# Patient Record
Sex: Male | Born: 1955 | Race: Black or African American | Hispanic: No | Marital: Married | Smoking: Former smoker
Health system: Southern US, Community
[De-identification: ages and names within clinical notes are randomized; demographics above are authoritative.]

## PROBLEM LIST (undated history)

## (undated) DIAGNOSIS — R42 Dizziness and giddiness: Secondary | ICD-10-CM

## (undated) DIAGNOSIS — M1990 Unspecified osteoarthritis, unspecified site: Secondary | ICD-10-CM

## (undated) DIAGNOSIS — R51 Headache: Secondary | ICD-10-CM

## (undated) DIAGNOSIS — I10 Essential (primary) hypertension: Secondary | ICD-10-CM

## (undated) DIAGNOSIS — E785 Hyperlipidemia, unspecified: Secondary | ICD-10-CM

## (undated) HISTORY — PX: COLONOSCOPY: SHX174

## (undated) HISTORY — PX: CORONARY ANGIOPLASTY: SHX604

## (undated) HISTORY — PX: HEMORRHOID SURGERY: SHX153

## (undated) HISTORY — PX: OTHER SURGICAL HISTORY: SHX169

---

## 1999-10-17 ENCOUNTER — Emergency Department (HOSPITAL_COMMUNITY): Admission: EM | Admit: 1999-10-17 | Discharge: 1999-10-17 | Payer: Self-pay | Admitting: Emergency Medicine

## 1999-10-17 ENCOUNTER — Encounter: Payer: Self-pay | Admitting: Emergency Medicine

## 2002-06-30 ENCOUNTER — Emergency Department (HOSPITAL_COMMUNITY): Admission: EM | Admit: 2002-06-30 | Discharge: 2002-07-01 | Payer: Self-pay | Admitting: Emergency Medicine

## 2004-03-09 ENCOUNTER — Ambulatory Visit: Payer: Self-pay | Admitting: *Deleted

## 2004-03-28 ENCOUNTER — Ambulatory Visit: Payer: Self-pay | Admitting: Internal Medicine

## 2004-05-03 ENCOUNTER — Ambulatory Visit: Payer: Self-pay | Admitting: Internal Medicine

## 2004-06-22 ENCOUNTER — Ambulatory Visit: Payer: Self-pay | Admitting: Internal Medicine

## 2004-07-04 ENCOUNTER — Ambulatory Visit: Payer: Self-pay | Admitting: Internal Medicine

## 2004-10-09 ENCOUNTER — Ambulatory Visit: Payer: Self-pay | Admitting: Family Medicine

## 2005-01-09 ENCOUNTER — Ambulatory Visit: Payer: Self-pay | Admitting: Internal Medicine

## 2005-12-01 ENCOUNTER — Emergency Department (HOSPITAL_COMMUNITY): Admission: EM | Admit: 2005-12-01 | Discharge: 2005-12-01 | Payer: Self-pay | Admitting: Emergency Medicine

## 2006-03-10 ENCOUNTER — Ambulatory Visit: Payer: Self-pay | Admitting: Internal Medicine

## 2006-03-28 ENCOUNTER — Ambulatory Visit: Payer: Self-pay | Admitting: Internal Medicine

## 2006-03-28 ENCOUNTER — Inpatient Hospital Stay (HOSPITAL_COMMUNITY): Admission: EM | Admit: 2006-03-28 | Discharge: 2006-03-29 | Payer: Self-pay | Admitting: Emergency Medicine

## 2006-06-03 ENCOUNTER — Ambulatory Visit: Payer: Self-pay | Admitting: Internal Medicine

## 2006-07-14 ENCOUNTER — Ambulatory Visit: Payer: Self-pay | Admitting: Internal Medicine

## 2006-08-07 ENCOUNTER — Ambulatory Visit: Payer: Self-pay | Admitting: Internal Medicine

## 2006-10-12 ENCOUNTER — Emergency Department (HOSPITAL_COMMUNITY): Admission: EM | Admit: 2006-10-12 | Discharge: 2006-10-12 | Payer: Self-pay | Admitting: Emergency Medicine

## 2007-02-09 ENCOUNTER — Ambulatory Visit: Payer: Self-pay | Admitting: Internal Medicine

## 2007-02-10 ENCOUNTER — Encounter (INDEPENDENT_AMBULATORY_CARE_PROVIDER_SITE_OTHER): Payer: Self-pay | Admitting: Internal Medicine

## 2007-03-11 ENCOUNTER — Encounter (INDEPENDENT_AMBULATORY_CARE_PROVIDER_SITE_OTHER): Payer: Self-pay | Admitting: *Deleted

## 2007-05-01 ENCOUNTER — Ambulatory Visit: Payer: Self-pay | Admitting: Internal Medicine

## 2007-09-25 ENCOUNTER — Ambulatory Visit: Payer: Self-pay | Admitting: Internal Medicine

## 2007-11-23 ENCOUNTER — Ambulatory Visit: Payer: Self-pay | Admitting: Internal Medicine

## 2007-11-23 LAB — CONVERTED CEMR LAB
Albumin: 4.5 g/dL (ref 3.5–5.2)
Alkaline Phosphatase: 43 units/L (ref 39–117)
BUN: 17 mg/dL (ref 6–23)
Calcium: 9.1 mg/dL (ref 8.4–10.5)
Chloride: 106 meq/L (ref 96–112)
Glucose, Bld: 95 mg/dL (ref 70–99)
LDL Cholesterol: 73 mg/dL (ref 0–99)
Potassium: 4.1 meq/L (ref 3.5–5.3)
Sodium: 141 meq/L (ref 135–145)
Total Protein: 7 g/dL (ref 6.0–8.3)
Triglycerides: 49 mg/dL (ref <150)

## 2007-12-03 ENCOUNTER — Emergency Department (HOSPITAL_COMMUNITY): Admission: EM | Admit: 2007-12-03 | Discharge: 2007-12-03 | Payer: Self-pay | Admitting: Emergency Medicine

## 2007-12-31 ENCOUNTER — Ambulatory Visit: Payer: Self-pay | Admitting: Internal Medicine

## 2008-04-15 ENCOUNTER — Ambulatory Visit: Payer: Self-pay | Admitting: Internal Medicine

## 2008-07-19 ENCOUNTER — Ambulatory Visit: Payer: Self-pay | Admitting: Internal Medicine

## 2008-11-08 ENCOUNTER — Ambulatory Visit: Payer: Self-pay | Admitting: Internal Medicine

## 2009-05-04 ENCOUNTER — Ambulatory Visit: Payer: Self-pay | Admitting: Family Medicine

## 2009-05-04 ENCOUNTER — Encounter (INDEPENDENT_AMBULATORY_CARE_PROVIDER_SITE_OTHER): Payer: Self-pay | Admitting: Internal Medicine

## 2009-05-04 LAB — CONVERTED CEMR LAB: Microalb, Ur: 1.74 mg/dL (ref 0.00–1.89)

## 2009-06-09 ENCOUNTER — Ambulatory Visit (HOSPITAL_COMMUNITY): Admission: RE | Admit: 2009-06-09 | Discharge: 2009-06-09 | Payer: Self-pay | Admitting: Internal Medicine

## 2009-06-12 ENCOUNTER — Ambulatory Visit: Payer: Self-pay | Admitting: Internal Medicine

## 2009-06-27 ENCOUNTER — Encounter: Admission: RE | Admit: 2009-06-27 | Discharge: 2009-08-02 | Payer: Self-pay | Admitting: Internal Medicine

## 2009-07-07 ENCOUNTER — Ambulatory Visit: Payer: Self-pay | Admitting: Family Medicine

## 2009-07-09 ENCOUNTER — Emergency Department (HOSPITAL_COMMUNITY): Admission: EM | Admit: 2009-07-09 | Discharge: 2009-07-10 | Payer: Self-pay | Admitting: Emergency Medicine

## 2009-09-13 ENCOUNTER — Ambulatory Visit: Payer: Self-pay | Admitting: Internal Medicine

## 2009-09-13 LAB — CONVERTED CEMR LAB
ALT: 11 units/L (ref 0–53)
AST: 19 units/L (ref 0–37)
Albumin: 4.6 g/dL (ref 3.5–5.2)
BUN: 12 mg/dL (ref 6–23)
CO2: 24 meq/L (ref 19–32)
Calcium: 9.5 mg/dL (ref 8.4–10.5)
Chloride: 103 meq/L (ref 96–112)
Cholesterol: 142 mg/dL (ref 0–200)
PSA: 0.97 ng/mL (ref 0.10–4.00)
Potassium: 4.1 meq/L (ref 3.5–5.3)

## 2009-12-13 ENCOUNTER — Ambulatory Visit: Payer: Self-pay | Admitting: Internal Medicine

## 2010-03-16 ENCOUNTER — Encounter (INDEPENDENT_AMBULATORY_CARE_PROVIDER_SITE_OTHER): Payer: Self-pay | Admitting: *Deleted

## 2010-03-16 LAB — CONVERTED CEMR LAB
BUN: 13 mg/dL (ref 6–23)
CO2: 24 meq/L (ref 19–32)
Chlamydia, Swab/Urine, PCR: NEGATIVE
Chloride: 103 meq/L (ref 96–112)
Glucose, Bld: 120 mg/dL — ABNORMAL HIGH (ref 70–99)
Hemoglobin: 12.4 g/dL — ABNORMAL LOW (ref 13.0–17.0)
LDL Cholesterol: 90 mg/dL (ref 0–99)
Lymphocytes Relative: 42 % (ref 12–46)
Lymphs Abs: 1.9 10*3/uL (ref 0.7–4.0)
Microalb, Ur: 0.52 mg/dL (ref 0.00–1.89)
Monocytes Relative: 7 % (ref 3–12)
Neutro Abs: 2.3 10*3/uL (ref 1.7–7.7)
Neutrophils Relative %: 49 % (ref 43–77)
Potassium: 4.1 meq/L (ref 3.5–5.3)
RBC: 4.43 M/uL (ref 4.22–5.81)
Triglycerides: 114 mg/dL (ref <150)
VLDL: 23 mg/dL (ref 0–40)
WBC: 4.6 10*3/uL (ref 4.0–10.5)

## 2010-05-20 ENCOUNTER — Emergency Department (HOSPITAL_COMMUNITY)
Admission: EM | Admit: 2010-05-20 | Discharge: 2010-05-20 | Payer: Self-pay | Source: Home / Self Care | Admitting: Emergency Medicine

## 2010-05-25 ENCOUNTER — Encounter (INDEPENDENT_AMBULATORY_CARE_PROVIDER_SITE_OTHER): Payer: Self-pay | Admitting: *Deleted

## 2010-05-25 LAB — CONVERTED CEMR LAB
AST: 18 units/L (ref 0–37)
Albumin: 4.4 g/dL (ref 3.5–5.2)
Alkaline Phosphatase: 42 units/L (ref 39–117)
BUN: 14 mg/dL (ref 6–23)
HDL: 54 mg/dL (ref >39)
Hgb A1c MFr Bld: 7.4 % — ABNORMAL HIGH (ref <5.7)
LDL Cholesterol: 72 mg/dL (ref 0–99)
Potassium: 4.2 meq/L (ref 3.5–5.3)
Total CHOL/HDL Ratio: 2.6

## 2010-08-02 ENCOUNTER — Inpatient Hospital Stay (INDEPENDENT_AMBULATORY_CARE_PROVIDER_SITE_OTHER)
Admission: RE | Admit: 2010-08-02 | Discharge: 2010-08-02 | Disposition: A | Discharge status: Home / Self Care | Payer: Self-pay | Source: Ambulatory Visit | Attending: Emergency Medicine | Admitting: Emergency Medicine

## 2010-08-02 DIAGNOSIS — M76899 Other specified enthesopathies of unspecified lower limb, excluding foot: Secondary | ICD-10-CM

## 2010-08-02 DIAGNOSIS — M25469 Effusion, unspecified knee: Secondary | ICD-10-CM

## 2010-08-14 ENCOUNTER — Other Ambulatory Visit (HOSPITAL_COMMUNITY): Payer: Self-pay | Admitting: Family Medicine

## 2010-08-14 DIAGNOSIS — M25561 Pain in right knee: Secondary | ICD-10-CM

## 2010-08-21 ENCOUNTER — Ambulatory Visit (HOSPITAL_COMMUNITY)
Admission: RE | Admit: 2010-08-21 | Discharge: 2010-08-21 | Disposition: A | Discharge status: Home / Self Care | Payer: Self-pay | Source: Ambulatory Visit | Attending: Family Medicine | Admitting: Family Medicine

## 2010-08-21 DIAGNOSIS — M1710 Unilateral primary osteoarthritis, unspecified knee: Secondary | ICD-10-CM | POA: Insufficient documentation

## 2010-08-21 DIAGNOSIS — M25561 Pain in right knee: Secondary | ICD-10-CM

## 2010-08-21 DIAGNOSIS — M25669 Stiffness of unspecified knee, not elsewhere classified: Secondary | ICD-10-CM | POA: Insufficient documentation

## 2010-08-21 DIAGNOSIS — IMO0002 Reserved for concepts with insufficient information to code with codable children: Secondary | ICD-10-CM | POA: Insufficient documentation

## 2010-08-21 DIAGNOSIS — M25569 Pain in unspecified knee: Secondary | ICD-10-CM | POA: Insufficient documentation

## 2010-08-21 DIAGNOSIS — M23329 Other meniscus derangements, posterior horn of medial meniscus, unspecified knee: Secondary | ICD-10-CM | POA: Insufficient documentation

## 2010-08-21 DIAGNOSIS — M25469 Effusion, unspecified knee: Secondary | ICD-10-CM | POA: Insufficient documentation

## 2010-09-04 LAB — BODY FLUID CULTURE

## 2010-09-04 LAB — BODY FLUID CELL COUNT WITH DIFFERENTIAL: Monocyte-Macrophage-Serous Fluid: 82 % (ref 50–90)

## 2010-11-09 NOTE — Discharge Summary (Signed)
Brendan Gonzales, Brendan Gonzales                ACCOUNT NO.:  [id number]   MEDICAL RECORD NO.:  [id number]          PATIENT TYPE:  INP   LOCATION:  3714                         FACILITY:  [facility]   PHYSICIAN:  Jackie Plum, M.D.DATE OF BIRTH:  08-01-1955   DATE OF ADMISSION:  03/28/2006  DATE OF DISCHARGE:  03/29/2006                                 DISCHARGE SUMMARY   DISCHARGE DIAGNOSIS:  1. Chest pain (cardiac catheterization done by Dr. Elsie Lincoln did not reveal      any obstructive coronary artery disease).  2. History of diabetes.   DISCHARGE MEDICATIONS:  The patient is going to resume his preadmission  medications.  He has been advised to hold his metformin until Monday,  March 31, 2006.  He is going to be on metformin 500 mg daily, Zocor 200 mg  daily, aspirin 81 mg daily, Tricor , Toprol-XL 25 mg daily.   He is to follow up with his PCP at [facility].   CONSULTATIONS:  Dr. Elsie Lincoln.   PROCEDURE:  Cardiac catheterization as noted above.   CONDITION ON DISCHARGE:  Improved, satisfactory.   REASON FOR ADMISSION:  Chest pain.  Patient presented with chest pain.  Pain  was relieved by nitroglycerin drip.  Chest x-ray did not reveal any acute  abnormality and his cardiac enzymes were negative.  Patient was seen by  cardiology as he has risk factors of obesity, hypertension, dyslipidemia.  Cardiac catheterization was done and came back negative.  Patient was  __________  hematoma in his groin.  He is chest pain free.  His  cardiopulmonary clear to auscultation and unremarkable.  Abdomen is soft  nontender.  Bowel sounds are present.  He is going to be discharged home on  the above medications.   LABORATORY DATA:  WBC count 6.4, hemoglobin 12.3, hematocrit 35.9, platelets  248,000.  Sodium 137, potassium 3.8, chloride 102, CO2 27, glucose 102, BUN  11, creatinine 0.7, calcium 8.9.   Patient discharged in stable and satisfactory condition.      Jackie Plum, M.D.  Electronically Signed     GO/MEDQ  D:  03/29/2006  T:  03/30/2006  Job:  [id number]   cc:   Madaline Savage, M.D.

## 2010-11-09 NOTE — Cardiovascular Report (Signed)
Brendan Gonzales, Brendan Gonzales                ACCOUNT NO.:  [id number]   MEDICAL RECORD NO.:  [id number]          PATIENT TYPE:  INP   LOCATION:  3714                         FACILITY:  [facility]   PHYSICIAN:  Madaline Savage, M.D.DATE OF BIRTH:  [birth year]   DATE OF PROCEDURE:  03/28/2006  DATE OF DISCHARGE:  03/29/2006                            CARDIAC CATHETERIZATION   PROCEDURES PERFORMED:  Cardiac catheterization by Judkins technique  including combined left heart catheterization with left ventricular  angiography and retrograde left heart catheterization.   COMPLICATIONS:  None.   ENTRY SITE:  Right femoral.   DYE USED:  Omnipaque.   PATIENT PROFILE:  The patient is a 55 year old hypertensive diabetic  gentleman who presented to the emergency room with chest pain that was  reported as being sometimes exertional, related and relieved with rest  and at other times coming with rest.  He had never taken nitroglycerin  and had never used antacid for it.  The pain has been occurring for the  last 2-3 months and worse presently causing his presentation to the  emergency room.  His EKG had shown normal sinus rhythm with first degree  AV block and ST-segment changes suggestive of early repolarization  versus ischemia.  Neither parent had known coronary disease.   MEDICATIONS:  1. Metformin  2. Lopid.  3. Lisinopril.   Because of his presentation and his risk factors, it was then decided to  proceed with diagnostic cardiac catheterization which was accomplished  without complication.   RESULTS:  The patient's coronary arteries were angiographically patent.  They consisted of a LAD coursing to the cardiac apex giving rise to one  septal branch and one diagonal branch, all of which was normal.  The  circumflex consisted of two obtuse marginal branches and then the  circumflex terminated as a posterolateral branch.  No lesions were seen  in the circumflex coronary artery.   The right  coronary artery was large and dominant giving rise to a  bifurcating posterolateral branch and posterior descending branch.  LVEF  was normal.   FINAL IMPRESSION:  1. Chest pain with cardiac risk factors.  2. Angiographically patent coronary arteries.  3. Normal left ventricular systolic function.   PLAN:  Reassurance.           ______________________________  Madaline Savage, M.D.     WHG/MEDQ  D:  07/02/2006  T:  07/02/2006  Job:  [id number]

## 2010-11-09 NOTE — H&P (Signed)
Brendan Gonzales, Brendan Gonzales                ACCOUNT NO.:  [id number]   MEDICAL RECORD NO.:  [id number]          PATIENT TYPE:  INP   LOCATION:  1827                         FACILITY:  [facility]   PHYSICIAN:  Jackie Plum, M.D.DATE OF BIRTH:  [birth year]   DATE OF ADMISSION:  03/28/2006  DATE OF DISCHARGE:                                HISTORY & PHYSICAL   CHIEF COMPLAINT:  Chest pain.   HISTORY OF PRESENT ILLNESS:  The patient has 2 days of chest pain.  Had  chest pain yesterday while at rest and had a recurrence of this pain today  as well.  Pain was described as sternal, sharp pain, which went up to his  neck.  There is some mild shortness of breath associated with the episode  without any dizziness, syncope, pre-syncope, palpitations, PND, orthopnea.  He has not had any history of fever, chills, vomiting, diarrhea, abdominal  pain. In the emergency room, the patient was put on nitroglycerin drip with  improvement in his pain. Lovenox was ordered .  At the emergency room, the  patient had 12-lead EKG done, which showed sinus rhythm  with first-degree  AV block.  Lab work was obtained.  Started on nitroglycerin drip with  improvement in his pain and the patient was admitted to hospitalist service.   PAST MEDICAL HISTORY:  1. History of hypertension.  2. Diabetes.  3. Dyslipidemia.   MEDICATIONS:  1. Lopid 600 mg b.i.d..  2. Metformin 500 mg daily.  3. Tramadol 50 mg t.i.d.  4. Lisinopril 10 mg daily.  5. HCTZ 25 mg daily.   ALLERGIES:  The patient does not have any medication allergies.   FAMILY HISTORY:  Negative for any history of heart disease or stoke.   SOCIAL HISTORY:  The patient does smoke cigarettes.  Does not drink alcohol.   REVIEW OF SYSTEMS:  As noted above and otherwise unremarkable. Marland Kitchen   PHYSICAL EXAMINATION:  VITAL SIGNS:  Blood pressure 151/94.  Pulse 69.  Respirations 16.  Temperature 98.3 degrees Fahrenheit.  O2 saturation of 99%  on oxygen by nasal  cannula.  GENERAL:  The patient was in acute pulmonary distress.  He received a  nitroglycerin drip.  HEENT:  Pupils equal, round and reactive to light.  NECK:  Supple.  No JVD.  LUNGS:  Clear to auscultation.  CARDIOVASCULAR:  Regular. No gallops.  ABDOMEN:  Obese, soft, bowel sounds present.  EXTREMITIES:  No cyanosis, no edema.   LABS:  EKG as above.  X-ray of the chest did not reveal any cardiomegaly.  acute cardiopulmonary infiltrates.  Changes seen.  Sodium 109, potassium  4.3, chloride 103, BUN 9, glucose 103.  Hemoglobin 13.9, hematocrit 41, WBC  count 4.5, platelet count 247. D-dimer was less than  0.22 mcg/mL.   IMPRESSION:  Chest pain, relieved by nitroglycerin.  The patient admitted to  telemetry bed. We continued  nitroglycerin drip and Lovenox.  Will hold  Metformin for now just in case he needs cardiac catheterization.  I have  asked Dr. Jacinto Halim of cardiology to evaluate patient in consultation.  Will  cycle  his exams, and further recommendations will be made as the database  expands.      Jackie Plum, M.D.  Electronically Signed     GO/MEDQ  D:  03/28/2006  T:  03/28/2006  Job:  [id number]   cc:   Cristy Hilts. Jacinto Halim, MD

## 2011-04-22 ENCOUNTER — Ambulatory Visit: Payer: Self-pay | Admitting: Physical Therapy

## 2011-04-22 DIAGNOSIS — IMO0001 Reserved for inherently not codable concepts without codable children: Secondary | ICD-10-CM | POA: Insufficient documentation

## 2011-04-22 DIAGNOSIS — M25569 Pain in unspecified knee: Secondary | ICD-10-CM | POA: Insufficient documentation

## 2011-04-22 DIAGNOSIS — M25669 Stiffness of unspecified knee, not elsewhere classified: Secondary | ICD-10-CM | POA: Insufficient documentation

## 2011-04-24 ENCOUNTER — Ambulatory Visit: Payer: Self-pay | Admitting: Physical Therapy

## 2011-04-30 ENCOUNTER — Ambulatory Visit: Payer: Self-pay | Attending: Student | Admitting: Physical Therapy

## 2011-04-30 DIAGNOSIS — M25669 Stiffness of unspecified knee, not elsewhere classified: Secondary | ICD-10-CM | POA: Insufficient documentation

## 2011-04-30 DIAGNOSIS — IMO0001 Reserved for inherently not codable concepts without codable children: Secondary | ICD-10-CM | POA: Insufficient documentation

## 2011-04-30 DIAGNOSIS — M25569 Pain in unspecified knee: Secondary | ICD-10-CM | POA: Insufficient documentation

## 2011-05-01 ENCOUNTER — Ambulatory Visit: Payer: Self-pay | Admitting: Physical Therapy

## 2011-05-07 ENCOUNTER — Ambulatory Visit: Payer: Self-pay | Admitting: Physical Therapy

## 2011-05-09 ENCOUNTER — Ambulatory Visit: Payer: Self-pay | Admitting: Physical Therapy

## 2011-05-14 ENCOUNTER — Ambulatory Visit: Payer: Self-pay | Admitting: Physical Therapy

## 2011-05-21 ENCOUNTER — Ambulatory Visit: Payer: Self-pay | Admitting: Physical Therapy

## 2011-05-23 ENCOUNTER — Ambulatory Visit: Payer: Self-pay | Admitting: Physical Therapy

## 2011-06-14 DIAGNOSIS — M1710 Unilateral primary osteoarthritis, unspecified knee: Secondary | ICD-10-CM | POA: Insufficient documentation

## 2011-06-26 ENCOUNTER — Encounter (HOSPITAL_COMMUNITY): Payer: Self-pay | Admitting: *Deleted

## 2011-06-27 ENCOUNTER — Ambulatory Visit (HOSPITAL_COMMUNITY): Payer: Self-pay | Admitting: Anesthesiology

## 2011-06-27 ENCOUNTER — Encounter (HOSPITAL_COMMUNITY): Payer: Self-pay | Admitting: Anesthesiology

## 2011-06-27 ENCOUNTER — Encounter (HOSPITAL_COMMUNITY)
Admission: RE | Disposition: A | Discharge status: Home / Self Care | Payer: Self-pay | Source: Ambulatory Visit | Attending: Gastroenterology

## 2011-06-27 ENCOUNTER — Encounter (HOSPITAL_COMMUNITY): Payer: Self-pay | Admitting: Gastroenterology

## 2011-06-27 ENCOUNTER — Other Ambulatory Visit: Payer: Self-pay | Admitting: Gastroenterology

## 2011-06-27 ENCOUNTER — Encounter (HOSPITAL_COMMUNITY): Payer: Self-pay | Admitting: *Deleted

## 2011-06-27 ENCOUNTER — Ambulatory Visit (HOSPITAL_COMMUNITY)
Admission: RE | Admit: 2011-06-27 | Discharge: 2011-06-27 | Disposition: A | Discharge status: Home / Self Care | Payer: Self-pay | Source: Ambulatory Visit | Attending: Gastroenterology | Admitting: Gastroenterology

## 2011-06-27 DIAGNOSIS — Z1211 Encounter for screening for malignant neoplasm of colon: Secondary | ICD-10-CM

## 2011-06-27 DIAGNOSIS — D126 Benign neoplasm of colon, unspecified: Secondary | ICD-10-CM | POA: Insufficient documentation

## 2011-06-27 HISTORY — DX: Headache: R51

## 2011-06-27 HISTORY — DX: Essential (primary) hypertension: I10

## 2011-06-27 HISTORY — DX: Dizziness and giddiness: R42

## 2011-06-27 HISTORY — DX: Unspecified osteoarthritis, unspecified site: M19.90

## 2011-06-27 HISTORY — DX: Hyperlipidemia, unspecified: E78.5

## 2011-06-27 LAB — GLUCOSE, CAPILLARY: Glucose-Capillary: 114 mg/dL — ABNORMAL HIGH (ref 70–99)

## 2011-06-27 SURGERY — COLONOSCOPY WITH PROPOFOL
Anesthesia: Monitor Anesthesia Care

## 2011-06-27 MED ORDER — MIDAZOLAM HCL 5 MG/5ML IJ SOLN
INTRAMUSCULAR | Status: DC | PRN
  Administered 2011-06-27: 2 mg via INTRAVENOUS

## 2011-06-27 MED ORDER — SODIUM CHLORIDE 0.9 % IV SOLN
Freq: Once | INTRAVENOUS | Status: AC
  Administered 2011-06-27: 1000 mL via INTRAVENOUS

## 2011-06-27 MED ORDER — FENTANYL CITRATE 0.05 MG/ML IJ SOLN
INTRAMUSCULAR | Status: DC | PRN
  Administered 2011-06-27 (×2): 50 ug via INTRAVENOUS

## 2011-06-27 MED ORDER — PROPOFOL 10 MG/ML IV EMUL
INTRAVENOUS | Status: DC | PRN
  Administered 2011-06-27: 140 ug/kg/min via INTRAVENOUS

## 2011-06-27 NOTE — Anesthesia Postprocedure Evaluation (Signed)
  Anesthesia Post-op Note  Patient: Brendan Gonzales  Procedure(s) Performed:  COLONOSCOPY WITH PROPOFOL  Patient Location: PACU  Anesthesia Type: MAC  Level of Consciousness: awake and alert   Airway and Oxygen Therapy: Patient Spontanous Breathing  Post-op Pain: mild  Post-op Assessment: Post-op Vital signs reviewed, Patient's Cardiovascular Status Stable, Respiratory Function Stable, Patent Airway and No signs of Nausea or vomiting  Post-op Vital Signs: stable  Complications: No apparent anesthesia complications

## 2011-06-27 NOTE — Transfer of Care (Signed)
Immediate Anesthesia Transfer of Care Note  Patient: Brendan Gonzales  Procedure(s) Performed:  COLONOSCOPY WITH PROPOFOL  Patient Location: PACU  Anesthesia Type: MAC  Level of Consciousness: sedated  Airway & Oxygen Therapy: Patient Spontanous Breathing and Patient connected to face mask oxygen  Post-op Assessment: Report given to PACU RN and Post -op Vital signs reviewed and stable  Post vital signs: Reviewed and stable  Complications: No apparent anesthesia complications

## 2011-06-27 NOTE — Interval H&P Note (Signed)
History and Physical Interval Note:  06/27/2011 2:35 PM  Brendan Gonzales  has presented today for surgery, with the diagnosis of screening  The various methods of treatment have been discussed with the patient and family. After consideration of risks, benefits and other options for treatment, the patient has consented to  Procedure(s): COLONOSCOPY WITH PROPOFOL as a surgical intervention .  The patients' history has been reviewed, patient examined, no change in status, stable for surgery.  I have reviewed the patients' chart and labs.  Questions were answered to the patient's satisfaction.     Gerry Blanchfield C.

## 2011-06-27 NOTE — Brief Op Note (Signed)
See endopro note 

## 2011-06-27 NOTE — H&P (Signed)
Date of Initial H&P: 06/10/11  History reviewed, patient examined, no change in status, stable for surgery.

## 2011-06-27 NOTE — Op Note (Signed)
[facility] [address] [place], [place]  [address]  COLONOSCOPY PROCEDURE REPORT  PATIENT:  Brendan Gonzales, Brendan Gonzales  MR#:  [id number] BIRTHDATE:  17-Dec-1955, 55 yrs. old  GENDER:  male ENDOSCOPIST:  Charlott Rakes, MD REF. BY:  Yisroel Ramming, M.D. PROCEDURE DATE:  06/27/2011 PROCEDURE:  Colonoscopy with snare polypectomy ASA CLASS:  Class III INDICATIONS:  Screening MEDICATIONS:   See Anesthesia Report.  DESCRIPTION OF PROCEDURE:   After the risks benefits and alternatives of the procedure were thoroughly explained, informed consent was obtained.  The EC-[id number] ([id number]) endoscope was introduced through the anus and advanced to the cecum, which was identified by both the appendix and ileocecal valve, without limitations.  The quality of the prep was good.  The instrument was then slowly withdrawn as the colon was fully examined. <<PROCEDUREIMAGES>>  FINDINGS:  Rectal exam unremarkable.  Pediatric colonoscope inserted into the colon and advanced to the cecum, where the appendiceal orifice and ileocecal valve were identified.    The terminal ileum was intubated and was normal in appearance. On careful withdrawal of the colonoscope a 5 mm sessile polyp was seen in the rectum that was removed with snare cautery. Retroflexion revealed small internal hemorrhoids.  COMPLICATIONS:  None  IMPRESSION:   1. Small colon polyp status post snare cautery 2. Small internal hemorrhoids  RECOMMENDATIONS:   1. Follow up on path 2. Hold aspirin products for 7 days  ______________________________ Charlott Rakes, MD  CC:  n. eSIGNEDCharlott Rakes at 06/27/2011 03:23 PM  Laurence Slate, [phone]

## 2011-06-27 NOTE — Anesthesia Preprocedure Evaluation (Signed)
Anesthesia Evaluation  Patient identified by MRN, date of birth, ID band Patient awake    Reviewed: Allergy & Precautions, H&P , NPO status , Patient's Chart, lab work & pertinent test results  Airway Mallampati: I TM Distance: >3 FB Neck ROM: Full    Dental No notable dental hx.    Pulmonary neg pulmonary ROS,  clear to auscultation  Pulmonary exam normal       Cardiovascular hypertension, Pt. on medications neg cardio ROS Regular Normal    Neuro/Psych  Headaches, Negative Neurological ROS  Negative Psych ROS   GI/Hepatic negative GI ROS, Neg liver ROS,   Endo/Other  Negative Endocrine ROSDiabetes mellitus-, Oral Hypoglycemic Agents  Renal/GU negative Renal ROS  Genitourinary negative   Musculoskeletal negative musculoskeletal ROS (+)   Abdominal   Peds negative pediatric ROS (+)  Hematology negative hematology ROS (+)   Anesthesia Other Findings   Reproductive/Obstetrics negative OB ROS                           Anesthesia Physical Anesthesia Plan  ASA: II  Anesthesia Plan: MAC   Post-op Pain Management:    Induction: Intravenous  Airway Management Planned:   Additional Equipment:   Intra-op Plan:   Post-operative Plan:   Informed Consent: I have reviewed the patients History and Physical, chart, labs and discussed the procedure including the risks, benefits and alternatives for the proposed anesthesia with the patient or authorized representative who has indicated his/her understanding and acceptance.   Dental advisory given  Plan Discussed with: CRNA  Anesthesia Plan Comments:         Anesthesia Quick Evaluation

## 2012-02-17 ENCOUNTER — Encounter (HOSPITAL_COMMUNITY): Payer: Self-pay

## 2012-02-17 ENCOUNTER — Emergency Department (INDEPENDENT_AMBULATORY_CARE_PROVIDER_SITE_OTHER)
Admission: EM | Admit: 2012-02-17 | Discharge: 2012-02-17 | Disposition: A | Discharge status: Home / Self Care | Payer: Self-pay | Source: Home / Self Care | Attending: Emergency Medicine | Admitting: Emergency Medicine

## 2012-02-17 DIAGNOSIS — R197 Diarrhea, unspecified: Secondary | ICD-10-CM

## 2012-02-17 LAB — POCT I-STAT, CHEM 8
BUN: 9 mg/dL (ref 6–23)
Chloride: 103 mEq/L (ref 96–112)
Creatinine, Ser: 0.8 mg/dL (ref 0.50–1.35)
Sodium: 142 mEq/L (ref 135–145)
TCO2: 26 mmol/L (ref 0–100)

## 2012-02-17 MED ORDER — ONDANSETRON 4 MG PO TBDP
4.0000 mg | ORAL_TABLET | Freq: Once | ORAL | Status: AC
  Administered 2012-02-17: 4 mg via ORAL

## 2012-02-17 MED ORDER — ONDANSETRON 4 MG PO TBDP
ORAL_TABLET | ORAL | Status: AC
  Filled 2012-02-17: qty 1

## 2012-02-17 MED ORDER — LOPERAMIDE HCL 2 MG PO CAPS: 2.0000 mg | ORAL_CAPSULE | Freq: Four times a day (QID) | ORAL | Status: AC | PRN

## 2012-02-17 MED ORDER — PROMETHAZINE HCL 25 MG PO TABS: 25.0000 mg | ORAL_TABLET | Freq: Four times a day (QID) | ORAL | Status: DC | PRN

## 2012-02-17 NOTE — ED Provider Notes (Signed)
History     CSN: [id number]  Arrival date & time 02/17/12  1258   First MD Initiated Contact with Patient 02/17/12 1526      Chief Complaint  Patient presents with  . Diarrhea    (Consider location/radiation/quality/duration/timing/severity/associated sxs/prior treatment) Patient is a 56 y.o. male presenting with diarrhea. The history is provided by the patient. No language interpreter was used.  Diarrhea The primary symptoms include diarrhea. The illness began today. The problem has been gradually worsening.  The illness does not include chills. Associated medical issues do not include inflammatory bowel disease.   Pt complains of diarrhea since yesterday.   Pt complains of abdominal cramping Past Medical History  Diagnosis Date  . Hypertension     benign  . Diabetes mellitus     type 2  . Arthritis   . Headache   . Hyperlipidemia   . Constipation   . Dizziness     Past Surgical History  Procedure Date  . Colonoscopy   . Right femeroral bypass   . Right knee surgery   . Coronary angioplasty   . Hemorrhoid surgery     Family History  Problem Relation Age of Onset  . Anesthesia problems Neg Hx   . Hypotension Neg Hx   . Malignant hyperthermia Neg Hx   . Pseudochol deficiency Neg Hx     History  Substance Use Topics  . Smoking status: Former Games developer  . Smokeless tobacco: Not on file  . Alcohol Use: No      Review of Systems  Constitutional: Negative for chills.  Gastrointestinal: Positive for diarrhea.  All other systems reviewed and are negative.    Allergies  Review of patient's allergies indicates no known allergies.  Home Medications   Current Outpatient Rx  Name Route Sig Dispense Refill  . ASPIRIN 81 MG PO TABS Oral Take 81 mg by mouth daily.      . FENOFIBRATE 145 MG PO TABS Oral Take 145 mg by mouth daily.      Marland Kitchen HYDROCHLOROTHIAZIDE 25 MG PO TABS Oral Take 25 mg by mouth daily.      Marland Kitchen LISINOPRIL 20 MG PO TABS Oral Take 20 mg by mouth  daily.      Marland Kitchen METFORMIN HCL 500 MG PO TABS Oral Take 1,000 mg by mouth daily before breakfast.      . METFORMIN HCL 500 MG PO TABS Oral Take 500 mg by mouth daily before supper.      Marland Kitchen METOPROLOL SUCCINATE ER 25 MG PO TB24 Oral Take 25 mg by mouth daily.      . THERA PO TABS Oral Take 1 tablet by mouth daily.      Marland Kitchen PANTOPRAZOLE SODIUM 40 MG PO TBEC Oral Take 40 mg by mouth daily.      Marland Kitchen ROSUVASTATIN CALCIUM 20 MG PO TABS Oral Take 20 mg by mouth daily.    . ATORVASTATIN CALCIUM 20 MG PO TABS Oral Take 20 mg by mouth daily.      Marland Kitchen GLIMEPIRIDE 4 MG PO TABS Oral Take 4 mg by mouth 2 (two) times daily.      Marland Kitchen HYDROCODONE-ACETAMINOPHEN 5-325 MG PO TABS Oral Take 1 tablet by mouth every 12 (twelve) hours.      . IBUPROFEN 800 MG PO TABS Oral Take 800 mg by mouth 3 (three) times daily.      Marland Kitchen NAPROXEN SODIUM 220 MG PO TABS Oral Take 220 mg by mouth 2 (two) times daily with a  meal.        BP 146/83  Pulse 63  Temp 97.9 F (36.6 C) (Oral)  Resp 16  SpO2 100%  Physical Exam  Nursing note and vitals reviewed. Constitutional: He is oriented to person, place, and time. He appears well-developed and well-nourished.  HENT:  Head: Normocephalic and atraumatic.  Eyes: Conjunctivae and EOM are normal. Pupils are equal, round, and reactive to light.  Neck: Normal range of motion. Neck supple.  Cardiovascular: Normal rate and normal heart sounds.   Pulmonary/Chest: Effort normal and breath sounds normal.  Abdominal: Soft. There is tenderness.  Musculoskeletal: Normal range of motion.  Neurological: He is alert and oriented to person, place, and time. He has normal reflexes.  Skin: Skin is warm.  Psychiatric: He has a normal mood and affect.    ED Course  Procedures (including critical care time)  Labs Reviewed  POCT I-STAT, CHEM 8 - Abnormal; Notable for the following:    Glucose, Bld 120 (*)     All other components within normal limits   No results found.   No diagnosis  found.    MDM  Pt given zofran.   IStat shows glucose of 120.    Pt advised immodium and phenergan.   Pt advised to go to ED if symptoms worsen or change        Lonia Skinner [place], [place] 02/17/12 1646

## 2012-02-17 NOTE — ED Notes (Signed)
Consumed and retained  8 oz of diet gingerale.

## 2012-02-17 NOTE — ED Notes (Signed)
Pt c/o diarrhea and abdominal cramping- sx started yesterday.  Denies fever, nausea or vomiting.  No known sick contacts.

## 2012-02-17 NOTE — ED Provider Notes (Signed)
Medical screening examination/treatment/procedure(s) were performed by non-physician practitioner and as supervising physician I was immediately available for consultation/collaboration.  Mela Perham, M.D.   Meir Elwood C Cinderella Christoffersen, MD 02/17/12 2135 

## 2012-10-08 ENCOUNTER — Encounter (HOSPITAL_COMMUNITY): Payer: Self-pay | Admitting: Emergency Medicine

## 2012-10-08 ENCOUNTER — Emergency Department (HOSPITAL_COMMUNITY)
Admission: EM | Admit: 2012-10-08 | Discharge: 2012-10-09 | Disposition: A | Discharge status: Home / Self Care | Payer: BC Managed Care – PPO | Attending: Emergency Medicine | Admitting: Emergency Medicine

## 2012-10-08 DIAGNOSIS — E785 Hyperlipidemia, unspecified: Secondary | ICD-10-CM | POA: Insufficient documentation

## 2012-10-08 DIAGNOSIS — J3489 Other specified disorders of nose and nasal sinuses: Secondary | ICD-10-CM | POA: Insufficient documentation

## 2012-10-08 DIAGNOSIS — E1169 Type 2 diabetes mellitus with other specified complication: Secondary | ICD-10-CM | POA: Insufficient documentation

## 2012-10-08 DIAGNOSIS — I10 Essential (primary) hypertension: Secondary | ICD-10-CM | POA: Insufficient documentation

## 2012-10-08 DIAGNOSIS — Z79899 Other long term (current) drug therapy: Secondary | ICD-10-CM | POA: Insufficient documentation

## 2012-10-08 DIAGNOSIS — Z7982 Long term (current) use of aspirin: Secondary | ICD-10-CM | POA: Insufficient documentation

## 2012-10-08 DIAGNOSIS — Z87891 Personal history of nicotine dependence: Secondary | ICD-10-CM | POA: Insufficient documentation

## 2012-10-08 DIAGNOSIS — Z8719 Personal history of other diseases of the digestive system: Secondary | ICD-10-CM | POA: Insufficient documentation

## 2012-10-08 DIAGNOSIS — R51 Headache: Secondary | ICD-10-CM

## 2012-10-08 DIAGNOSIS — Z8739 Personal history of other diseases of the musculoskeletal system and connective tissue: Secondary | ICD-10-CM | POA: Insufficient documentation

## 2012-10-08 LAB — BASIC METABOLIC PANEL
BUN: 12 mg/dL (ref 6–23)
CO2: 28 mEq/L (ref 19–32)
Calcium: 9.3 mg/dL (ref 8.4–10.5)
Chloride: 96 mEq/L (ref 96–112)
Creatinine, Ser: 0.63 mg/dL (ref 0.50–1.35)
GFR calc Af Amer: >90 mL/min (ref >90)
GFR calc non Af Amer: >90 mL/min (ref >90)
Glucose, Bld: 262 mg/dL — ABNORMAL HIGH (ref 70–99)
Potassium: 3.4 mEq/L — ABNORMAL LOW (ref 3.5–5.1)
Sodium: 136 mEq/L (ref 135–145)

## 2012-10-08 LAB — BLOOD GAS, VENOUS
Acid-Base Excess: 3.8 mmol/L — ABNORMAL HIGH (ref 0.0–2.0)
Bicarbonate: 26 mEq/L — ABNORMAL HIGH (ref 20.0–24.0)
O2 Saturation: 99.6 %
Patient temperature: 98.6
TCO2: 22.6 mmol/L (ref 0–100)
pCO2, Ven: 32.3 mmHg — ABNORMAL LOW (ref 45.0–50.0)
pH, Ven: 7.516 — ABNORMAL HIGH (ref 7.250–7.300)
pO2, Ven: 185 mmHg — ABNORMAL HIGH (ref 30.0–45.0)

## 2012-10-08 LAB — CBC WITH DIFFERENTIAL/PLATELET
Basophils Absolute: 0 10*3/uL (ref 0.0–0.1)
Basophils Relative: 0 % (ref 0–1)
Eosinophils Absolute: 0.2 10*3/uL (ref 0.0–0.7)
Eosinophils Relative: 3 % (ref 0–5)
HCT: 37.3 % — ABNORMAL LOW (ref 39.0–52.0)
Hemoglobin: 12.8 g/dL — ABNORMAL LOW (ref 13.0–17.0)
Lymphocytes Relative: 29 % (ref 12–46)
Lymphs Abs: 1.6 10*3/uL (ref 0.7–4.0)
MCH: 27.2 pg (ref 26.0–34.0)
MCHC: 34.3 g/dL (ref 30.0–36.0)
MCV: 79.4 fL (ref 78.0–100.0)
Monocytes Absolute: 0.5 10*3/uL (ref 0.1–1.0)
Monocytes Relative: 8 % (ref 3–12)
Neutro Abs: 3.3 10*3/uL (ref 1.7–7.7)
Neutrophils Relative %: 60 % (ref 43–77)
Platelets: 253 10*3/uL (ref 150–400)
RBC: 4.7 MIL/uL (ref 4.22–5.81)
RDW: 12.9 % (ref 11.5–15.5)
WBC: 5.5 10*3/uL (ref 4.0–10.5)

## 2012-10-08 LAB — URINALYSIS, ROUTINE W REFLEX MICROSCOPIC
Bilirubin Urine: NEGATIVE
Glucose, UA: 500 mg/dL — AB
Hgb urine dipstick: NEGATIVE
Ketones, ur: NEGATIVE mg/dL
Leukocytes, UA: NEGATIVE
Nitrite: NEGATIVE
Protein, ur: NEGATIVE mg/dL
Specific Gravity, Urine: 1.028 (ref 1.005–1.030)
Urobilinogen, UA: 1 mg/dL (ref 0.0–1.0)
pH: 6.5 (ref 5.0–8.0)

## 2012-10-08 LAB — GLUCOSE, CAPILLARY
Glucose-Capillary: 260 mg/dL — ABNORMAL HIGH (ref 70–99)
Glucose-Capillary: 245 mg/dL — ABNORMAL HIGH (ref 70–99)

## 2012-10-08 MED ORDER — IBUPROFEN 800 MG PO TABS
800.0000 mg | ORAL_TABLET | Freq: Once | ORAL | Status: AC
  Administered 2012-10-08: 800 mg via ORAL
  Filled 2012-10-08: qty 1

## 2012-10-08 MED ORDER — SODIUM CHLORIDE 0.9 % IV BOLUS (SEPSIS)
1000.0000 mL | Freq: Once | INTRAVENOUS | Status: AC
  Administered 2012-10-08: 1000 mL via INTRAVENOUS

## 2012-10-08 NOTE — ED Provider Notes (Signed)
History     CSN: [id number]  Arrival date & time 10/08/12  4098   First MD Initiated Contact with Patient 10/08/12 2112      Chief Complaint  Patient presents with  . Headache  . Hyperglycemia  . Hypertension    (Consider location/radiation/quality/duration/timing/severity/associated sxs/prior treatment) HPI Patient presents to the emergency department with headache and elevated blood sugars for last 2 days.  Patient, states he's also had nasal congestion, and rhinorrhea. patient denies chest pain, shortness of breath, abdominal pain, nausea, vomiting, weakness, fever, diarrhea, rash, dysuria, back pain, or visual changes.  Patient also denies syncope.  Patient, states she's had a history of headaches in the past.  Patient did not take anything prior to arrival for his headache.  Patient, states nothing seems to make his headache, better or worse Past Medical History  Diagnosis Date  . Hypertension     benign  . Diabetes mellitus     type 2  . Arthritis   . Headache   . Hyperlipidemia   . Constipation   . Dizziness     Past Surgical History  Procedure Laterality Date  . Colonoscopy    . Right femeroral bypass    . Right knee surgery    . Coronary angioplasty    . Hemorrhoid surgery      Family History  Problem Relation Age of Onset  . Anesthesia problems Neg Hx   . Hypotension Neg Hx   . Malignant hyperthermia Neg Hx   . Pseudochol deficiency Neg Hx   . Diabetes Other   . Hypertension Other     History  Substance Use Topics  . Smoking status: Former Games developer  . Smokeless tobacco: Not on file  . Alcohol Use: No      Review of Systems All other systems negative except as documented in the HPI. All pertinent positives and negatives as reviewed in the HPI. Allergies  Review of patient's allergies indicates no known allergies.  Home Medications   Current Outpatient Rx  Name  Route  Sig  Dispense  Refill  . aspirin 81 MG tablet   Oral   Take 81 mg by  mouth daily.           . fenofibrate (TRICOR) 145 MG tablet   Oral   Take 145 mg by mouth daily.           . fexofenadine (ALLEGRA) 180 MG tablet   Oral   Take 180 mg by mouth daily.         Marland Kitchen GARLIC PO   Oral   Take 1 tablet by mouth daily.         . hydrochlorothiazide (HYDRODIURIL) 25 MG tablet   Oral   Take 25 mg by mouth daily.           Marland Kitchen lisinopril (PRINIVIL,ZESTRIL) 20 MG tablet   Oral   Take 20 mg by mouth daily.           . metFORMIN (GLUCOPHAGE) 500 MG tablet   Oral   Take 1,000 mg by mouth 2 (two) times daily with a meal.          . metoprolol succinate (TOPROL-XL) 25 MG 24 hr tablet   Oral   Take 25 mg by mouth daily.           . naproxen sodium (ALEVE) 220 MG tablet   Oral   Take 220 mg by mouth 2 (two) times daily with a meal.           .  omeprazole (PRILOSEC) 20 MG capsule   Oral   Take 20 mg by mouth daily.         . rosuvastatin (CRESTOR) 20 MG tablet   Oral   Take 20 mg by mouth daily.         . simvastatin (ZOCOR) 20 MG tablet   Oral   Take 20 mg by mouth every evening.         . Multiple Vitamin (THERA) TABS   Oral   Take 1 tablet by mouth daily.             BP 104/66  Pulse 66  Temp(Src) 98.9 F (37.2 C) (Oral)  Resp 16  SpO2 99%  Physical Exam  Nursing note and vitals reviewed. Constitutional: He is oriented to person, place, and time. He appears well-developed and well-nourished. No distress.  HENT:  Head: Normocephalic and atraumatic.  Mouth/Throat: Oropharynx is clear and moist.  Eyes: EOM are normal. Pupils are equal, round, and reactive to light. No scleral icterus.  Neck: Normal range of motion. Neck supple.  Cardiovascular: Normal rate, regular rhythm and normal heart sounds.  Exam reveals no gallop and no friction rub.   No murmur heard. Pulmonary/Chest: Effort normal and breath sounds normal. No respiratory distress.  Abdominal: Soft. Bowel sounds are normal. There is no tenderness.   Neurological: He is alert and oriented to person, place, and time. He exhibits normal muscle tone. Coordination normal.  Skin: Skin is warm and dry. No rash noted.    ED Course  Procedures (including critical care time)  Labs Reviewed  GLUCOSE, CAPILLARY - Abnormal; Notable for the following:    Glucose-Capillary 260 (*)    All other components within normal limits  BASIC METABOLIC PANEL - Abnormal; Notable for the following:    Potassium 3.4 (*)    Glucose, Bld 262 (*)    All other components within normal limits  CBC WITH DIFFERENTIAL - Abnormal; Notable for the following:    Hemoglobin 12.8 (*)    HCT 37.3 (*)    All other components within normal limits  URINALYSIS, ROUTINE W REFLEX MICROSCOPIC - Abnormal; Notable for the following:    Glucose, UA 500 (*)    All other components within normal limits  BLOOD GAS, VENOUS - Abnormal; Notable for the following:    pH, Ven 7.516 (*)    pCO2, Ven 32.3 (*)    pO2, Ven 185.0 (*)    Bicarbonate 26.0 (*)    Acid-Base Excess 3.8 (*)    All other components within normal limits  GLUCOSE, CAPILLARY - Abnormal; Notable for the following:    Glucose-Capillary 245 (*)    All other components within normal limits   patient was given IV fluids and monitored here in the emergency department.  He is not showing any signs of DKA.  Patient's headache is improved with ibuprofen 800 mg..  Patient is advised, that he'll need to followup with his primary care Dr. for further evaluation and recheck.  Patient had a history of similar type headaches in the past.  Patient does not have any motor or neurological deficits noted on exam.    MDM  MDM Reviewed: vitals, nursing note and previous chart Interpretation: labs           Carlyle Dolly, PA-C 10/09/12 0018

## 2012-10-08 NOTE — ED Notes (Signed)
Family states pt has been c/o headache since yesterday and states his blood sugar has been running elevated in the 400s

## 2012-10-09 NOTE — ED Provider Notes (Signed)
Medical screening examination/treatment/procedure(s) were performed by non-physician practitioner and as supervising physician I was immediately available for consultation/collaboration.   Gavin Pound. Oletta Lamas, MD 10/09/12 1610

## 2013-05-31 ENCOUNTER — Ambulatory Visit (INDEPENDENT_AMBULATORY_CARE_PROVIDER_SITE_OTHER): Payer: BC Managed Care – PPO | Admitting: Family Medicine

## 2013-05-31 DIAGNOSIS — Z23 Encounter for immunization: Secondary | ICD-10-CM

## 2013-06-12 ENCOUNTER — Encounter (HOSPITAL_COMMUNITY): Payer: Self-pay | Admitting: Emergency Medicine

## 2013-06-12 ENCOUNTER — Emergency Department (HOSPITAL_COMMUNITY): Payer: BC Managed Care – PPO

## 2013-06-12 ENCOUNTER — Observation Stay (HOSPITAL_COMMUNITY)
Admission: EM | Admit: 2013-06-12 | Discharge: 2013-06-13 | Disposition: A | Discharge status: Home / Self Care | Payer: BC Managed Care – PPO | Attending: Internal Medicine | Admitting: Internal Medicine

## 2013-06-12 DIAGNOSIS — E785 Hyperlipidemia, unspecified: Secondary | ICD-10-CM | POA: Insufficient documentation

## 2013-06-12 DIAGNOSIS — I10 Essential (primary) hypertension: Secondary | ICD-10-CM | POA: Insufficient documentation

## 2013-06-12 DIAGNOSIS — R0789 Other chest pain: Principal | ICD-10-CM | POA: Insufficient documentation

## 2013-06-12 DIAGNOSIS — IMO0001 Reserved for inherently not codable concepts without codable children: Secondary | ICD-10-CM

## 2013-06-12 DIAGNOSIS — E119 Type 2 diabetes mellitus without complications: Secondary | ICD-10-CM | POA: Diagnosis present

## 2013-06-12 DIAGNOSIS — R079 Chest pain, unspecified: Secondary | ICD-10-CM | POA: Diagnosis present

## 2013-06-12 DIAGNOSIS — Z7982 Long term (current) use of aspirin: Secondary | ICD-10-CM | POA: Insufficient documentation

## 2013-06-12 DIAGNOSIS — R42 Dizziness and giddiness: Secondary | ICD-10-CM | POA: Insufficient documentation

## 2013-06-12 DIAGNOSIS — Z87891 Personal history of nicotine dependence: Secondary | ICD-10-CM | POA: Insufficient documentation

## 2013-06-12 DIAGNOSIS — H538 Other visual disturbances: Secondary | ICD-10-CM | POA: Insufficient documentation

## 2013-06-12 DIAGNOSIS — Z79899 Other long term (current) drug therapy: Secondary | ICD-10-CM | POA: Insufficient documentation

## 2013-06-12 DIAGNOSIS — R61 Generalized hyperhidrosis: Secondary | ICD-10-CM | POA: Insufficient documentation

## 2013-06-12 DIAGNOSIS — R9431 Abnormal electrocardiogram [ECG] [EKG]: Secondary | ICD-10-CM | POA: Insufficient documentation

## 2013-06-12 DIAGNOSIS — R110 Nausea: Secondary | ICD-10-CM | POA: Insufficient documentation

## 2013-06-12 DIAGNOSIS — K219 Gastro-esophageal reflux disease without esophagitis: Secondary | ICD-10-CM

## 2013-06-12 LAB — BASIC METABOLIC PANEL
CO2: 27 mEq/L (ref 19–32)
Chloride: 96 mEq/L (ref 96–112)
GFR calc Af Amer: >90 mL/min (ref >90)
Potassium: 3.5 mEq/L (ref 3.5–5.1)
Sodium: 134 mEq/L — ABNORMAL LOW (ref 135–145)

## 2013-06-12 LAB — CBC
MCV: 82 fL (ref 78.0–100.0)
Platelets: 225 10*3/uL (ref 150–400)
RBC: 4.5 MIL/uL (ref 4.22–5.81)
RDW: 13.2 % (ref 11.5–15.5)
WBC: 5.7 10*3/uL (ref 4.0–10.5)

## 2013-06-12 LAB — DIFFERENTIAL
Basophils Relative: 0 % (ref 0–1)
Eosinophils Absolute: 0.2 10*3/uL (ref 0.0–0.7)
Eosinophils Relative: 3 % (ref 0–5)
Monocytes Absolute: 0.5 10*3/uL (ref 0.1–1.0)
Monocytes Relative: 10 % (ref 3–12)

## 2013-06-12 LAB — TROPONIN I
Troponin I: <0.3 ng/mL (ref <0.30)
Troponin I: <0.3 ng/mL (ref <0.30)

## 2013-06-12 LAB — URINALYSIS, ROUTINE W REFLEX MICROSCOPIC
Bilirubin Urine: NEGATIVE
Hgb urine dipstick: NEGATIVE
Ketones, ur: NEGATIVE mg/dL
Leukocytes, UA: NEGATIVE
Protein, ur: NEGATIVE mg/dL
Urobilinogen, UA: 0.2 mg/dL (ref 0.0–1.0)

## 2013-06-12 MED ORDER — LISINOPRIL 20 MG PO TABS
20.0000 mg | ORAL_TABLET | Freq: Every morning | ORAL | Status: DC
  Administered 2013-06-12 – 2013-06-13 (×2): 20 mg via ORAL
  Filled 2013-06-12 (×2): qty 1

## 2013-06-12 MED ORDER — ACETAMINOPHEN 325 MG PO TABS
650.0000 mg | ORAL_TABLET | Freq: Four times a day (QID) | ORAL | Status: DC | PRN
  Administered 2013-06-12: 650 mg via ORAL
  Filled 2013-06-12: qty 2

## 2013-06-12 MED ORDER — IBUPROFEN 800 MG PO TABS
800.0000 mg | ORAL_TABLET | Freq: Four times a day (QID) | ORAL | Status: AC | PRN
  Administered 2013-06-12 (×2): 800 mg via ORAL
  Filled 2013-06-12 (×2): qty 1

## 2013-06-12 MED ORDER — SIMVASTATIN 20 MG PO TABS
20.0000 mg | ORAL_TABLET | Freq: Every evening | ORAL | Status: DC
  Administered 2013-06-12: 18:00:00 20 mg via ORAL
  Filled 2013-06-12 (×2): qty 1

## 2013-06-12 MED ORDER — ASPIRIN 325 MG PO TABS
325.0000 mg | ORAL_TABLET | Freq: Every day | ORAL | Status: DC
  Administered 2013-06-13: 15:00:00 325 mg via ORAL
  Filled 2013-06-12: qty 1

## 2013-06-12 MED ORDER — PANTOPRAZOLE SODIUM 40 MG IV SOLR
40.0000 mg | Freq: Once | INTRAVENOUS | Status: AC
  Administered 2013-06-12: 40 mg via INTRAVENOUS
  Filled 2013-06-12: qty 40

## 2013-06-12 MED ORDER — METOPROLOL SUCCINATE ER 25 MG PO TB24
25.0000 mg | ORAL_TABLET | Freq: Every morning | ORAL | Status: DC
  Administered 2013-06-12 – 2013-06-13 (×2): 25 mg via ORAL
  Filled 2013-06-12 (×2): qty 1

## 2013-06-12 MED ORDER — PNEUMOCOCCAL VAC POLYVALENT 25 MCG/0.5ML IJ INJ
0.5000 mL | INJECTION | INTRAMUSCULAR | Status: AC
  Administered 2013-06-13: 0.5 mL via INTRAMUSCULAR
  Filled 2013-06-12 (×2): qty 0.5

## 2013-06-12 MED ORDER — ONDANSETRON HCL 4 MG/2ML IJ SOLN: 4.0000 mg | Freq: Four times a day (QID) | INTRAMUSCULAR | Status: DC | PRN

## 2013-06-12 MED ORDER — ENOXAPARIN SODIUM 40 MG/0.4ML ~~LOC~~ SOLN
40.0000 mg | SUBCUTANEOUS | Status: DC
  Administered 2013-06-12 – 2013-06-13 (×2): 40 mg via SUBCUTANEOUS
  Filled 2013-06-12 (×2): qty 0.4

## 2013-06-12 MED ORDER — ASPIRIN 325 MG PO TABS
325.0000 mg | ORAL_TABLET | ORAL | Status: AC
  Administered 2013-06-12: 325 mg via ORAL
  Filled 2013-06-12: qty 1

## 2013-06-12 MED ORDER — GI COCKTAIL ~~LOC~~
30.0000 mL | Freq: Once | ORAL | Status: AC
  Administered 2013-06-12: 30 mL via ORAL
  Filled 2013-06-12: qty 30

## 2013-06-12 MED ORDER — HYDROMORPHONE HCL PF 1 MG/ML IJ SOLN: 1.0000 mg | Freq: Once | INTRAMUSCULAR | Status: DC

## 2013-06-12 MED ORDER — HYDROCHLOROTHIAZIDE 25 MG PO TABS
25.0000 mg | ORAL_TABLET | Freq: Every morning | ORAL | Status: DC
Start: 2013-06-12 — End: 2013-06-13
  Administered 2013-06-12 – 2013-06-13 (×2): 25 mg via ORAL
  Filled 2013-06-12 (×2): qty 1

## 2013-06-12 MED ORDER — PANTOPRAZOLE SODIUM 40 MG PO TBEC
40.0000 mg | DELAYED_RELEASE_TABLET | Freq: Every morning | ORAL | Status: DC
  Administered 2013-06-13: 40 mg via ORAL
  Filled 2013-06-12: qty 1

## 2013-06-12 NOTE — ED Notes (Signed)
Pt c/o HA, intermittent HTN x 2 days. Pt states he is compliant with meds. Pt c/o chest pressure onset 1800 last night. A & O.

## 2013-06-12 NOTE — ED Provider Notes (Signed)
CSN: [id number]     Arrival date & time 06/12/13  0272 History   First MD Initiated Contact with Patient 06/12/13 0414     Chief Complaint  Patient presents with  . Hypertension   (Consider location/radiation/quality/duration/timing/severity/associated sxs/prior Treatment) HPI History provided by pt and his daughter who is also interpreting.  Pt has had constant, non-radiating pressure in the center of his chest that is aggravated by exertion since 4am at work yesterday.  Has been checking his BP q30 min since onset, thinking it may be secondary to HTN, and BP has waxed and waned.  At 3am this morning, he was on facebook in the break room at work, when he had acute onset dizziness, described as feeling off balance, as well as vision changes.  Lasted ~35min before resolving spontaneously.  Has chronic dizziness that he attributes to his anti-hypertensives, but this episode was more severe.  Has had a constant, frontal headache since 11pm last night.  Headaches are also chronic and occur daily.  Has not taken anything for the pain.  Denies dysarthria, dysphagia, extremity weakness/pareshtesias, confusion.  No recent head trauma.  H/o HTN, diabetes and high cholesterol.  Per prior chart, admitted for CP in 2007 and heart cath was negative for obstructive CAD at that time.  Past Medical History  Diagnosis Date  . Hypertension     benign  . Diabetes mellitus     type 2  . Arthritis   . Headache(784.0)   . Hyperlipidemia   . Constipation   . Dizziness    Past Surgical History  Procedure Laterality Date  . Colonoscopy    . Right femeroral bypass    . Right knee surgery    . Coronary angioplasty    . Hemorrhoid surgery     Family History  Problem Relation Age of Onset  . Anesthesia problems Neg Hx   . Hypotension Neg Hx   . Malignant hyperthermia Neg Hx   . Pseudochol deficiency Neg Hx   . Diabetes Other   . Hypertension Other    History  Substance Use Topics  . Smoking status:  Former Games developer  . Smokeless tobacco: Not on file  . Alcohol Use: No    Review of Systems  All other systems reviewed and are negative.    Allergies  Review of patient's allergies indicates no known allergies.  Home Medications   Current Outpatient Rx  Name  Route  Sig  Dispense  Refill  . aspirin 81 MG tablet   Oral   Take 81 mg by mouth every morning.          Marland Kitchen GARLIC PO   Oral   Take 1 capsule by mouth every morning.         . hydrochlorothiazide (HYDRODIURIL) 25 MG tablet   Oral   Take 25 mg by mouth every morning.          Marland Kitchen ibuprofen (ADVIL,MOTRIN) 200 MG tablet   Oral   Take 400-800 mg by mouth every 8 (eight) hours as needed for moderate pain.         Marland Kitchen lisinopril (PRINIVIL,ZESTRIL) 20 MG tablet   Oral   Take 20 mg by mouth every morning.          . metFORMIN (GLUCOPHAGE) 500 MG tablet   Oral   Take 1,000 mg by mouth 2 (two) times daily with a meal.          . simvastatin (ZOCOR) 20 MG tablet  Oral   Take 20 mg by mouth every evening.         . metoprolol succinate (TOPROL-XL) 25 MG 24 hr tablet   Oral   Take 25 mg by mouth every morning.          . pantoprazole (PROTONIX) 40 MG tablet   Oral   Take 40 mg by mouth every morning.          BP 159/94  Pulse 76  Temp(Src) 98.3 F (36.8 C) (Oral)  Resp 16  Wt 225 lb (102.059 kg)  SpO2 97% Physical Exam  Nursing note and vitals reviewed. Constitutional: He is oriented to person, place, and time. He appears well-developed and well-nourished. No distress.  HENT:  Head: Normocephalic and atraumatic.  Eyes:  Normal appearance  Neck: Normal range of motion.  Cardiovascular: Normal rate, regular rhythm and intact distal pulses.   Pulmonary/Chest: Effort normal and breath sounds normal.  Musculoskeletal: Normal range of motion.  Neurological: He is alert and oriented to person, place, and time. He displays no tremor. No sensory deficit. He displays a negative Romberg sign.  Coordination and gait normal.  CN 3-12 intact.  5/5 and equal upper and lower extremity strength.  No past pointing.  No pronator drift.  No nystagmus.   Skin: Skin is warm and dry. No rash noted.  Psychiatric: He has a normal mood and affect. His behavior is normal.    ED Course  Procedures (including critical care time) Labs Review Labs Reviewed  CBC - Abnormal; Notable for the following:    Hemoglobin 12.6 (*)    HCT 36.9 (*)    All other components within normal limits  BASIC METABOLIC PANEL - Abnormal; Notable for the following:    Sodium 134 (*)    Glucose, Bld 113 (*)    All other components within normal limits  TROPONIN I  DIFFERENTIAL  CBC WITH DIFFERENTIAL  URINALYSIS, ROUTINE W REFLEX MICROSCOPIC   Imaging Review Ct Head Wo Contrast  06/12/2013   CLINICAL DATA:  Dizziness and vision changes  EXAM: CT HEAD WITHOUT CONTRAST  TECHNIQUE: Contiguous axial images were obtained from the base of the skull through the vertex without intravenous contrast.  COMPARISON:  None.  FINDINGS: Skull and Sinuses:No significant abnormality.  Orbits: No acute abnormality.  Brain: No evidence of acute abnormality, such as acute infarction, hemorrhage, hydrocephalus, or mass lesion/mass effect. Mild age related cerebral volume decrease.  IMPRESSION: No evidence of acute intracranial disease.   Electronically Signed   By: Tiburcio Pea M.D.   On: 06/12/2013 05:59   Dg Chest Port 1 View  06/12/2013   CLINICAL DATA:  Hypertension.  EXAM: PORTABLE CHEST - 1 VIEW  COMPARISON:  10/12/2006  FINDINGS: Mild cardiomegaly which is stable. No acute change in the upper mediastinal contours. No infiltrate, edema, effusion, or pneumothorax.  IMPRESSION: No active disease.   Electronically Signed   By: Tiburcio Pea M.D.   On: 06/12/2013 05:46    EKG Interpretation   None       MDM  No diagnosis found. 57yo M w/ GERD, HTN, diabetes and high cholesterol presents w/ chest pain x 24 hours.  Has been  checking BP since onset and it has waxed and waned, systolic reaching 200s.  Had an episode of worse than baseline dizziness as well as blurred vision that lasted ~31min before resolving spontaneously at 3am today.  Has had an acute on chronic headache since 11pm last night.  On exam  today, afebrile, non-toxic appearing, no respiratory distress, nml breath sounds, no focal neuro deficits.  Troponin neg and labs otherwise unremarkable.  CT head and CXR are pending.  Nursing staff has given him aspirin.  I ordered GI cocktail and po protonix as well.  5:32 AM   Labs unremarkable. CT head and CXR negative.  Dr. Ranae Palms has seen him.  Will admit for ACS r/o as well as TIA work-up.  [place], PA-C to resume care.  .6:13 AM     Otilio Miu, PA-C 06/12/13 [phone]

## 2013-06-12 NOTE — H&P (Signed)
[facility] History and Physical  Ira Dougher UJW:[id number] DOB: Jul 13, 1955 DOA: 06/12/2013  Referring physician: EDP PCP: No primary provider on file.   Chief Complaint: chest pressure  HPI: Rhea Thrun is a 57 y.o. male with H/o DM, HTN, dyslipidemia was in his usual state of health till yesterday, he started experiencing chest pain which he described as chest pressure non radiating, associated with nausea, this occurred intermittently yesterday, he checked his BP and noted that it was much higher than usual too. He went to work, and at 2:30am started experiencing chest pressure again with nausea and diaphoresis, his BP now was 225 systolic. Around this time noticed blurry vision and dizziness. Then called his supervisor and his family and was brought to the ER for further evaluation. His EKG shows some mild elevation of the ST segments in anterior leads, chest pain improved now, [facility] consulted for further management.   Review of Systems:  Constitutional:  No weight loss, night sweats, Fevers, chills, fatigue.  HEENT:  No headaches, Difficulty swallowing,Tooth/dental problems,Sore throat,  No sneezing, itching, ear ache, nasal congestion, post nasal drip,  Cardio-vascular:  No chest pain, Orthopnea, PND, swelling in lower extremities, anasarca, dizziness, palpitations  GI:  No heartburn, indigestion, abdominal pain, nausea, vomiting, diarrhea, change in bowel habits, loss of appetite  Resp:  No shortness of breath with exertion or at rest. No excess mucus, no productive cough, No non-productive cough, No coughing up of blood.No change in color of mucus.No wheezing.No chest wall deformity  Skin:  no rash or lesions.  GU:  no dysuria, change in color of urine, no urgency or frequency. No flank pain.  Musculoskeletal:  No joint pain or swelling. No decreased range of motion. No back pain.  Psych:  No change in mood or affect. No depression or anxiety. No memory loss.   Past  Medical History  Diagnosis Date  . Hypertension     benign  . Diabetes mellitus     type 2  . Arthritis   . Headache(784.0)   . Hyperlipidemia   . Constipation   . Dizziness    Past Surgical History  Procedure Laterality Date  . Colonoscopy    . Right femeroral bypass    . Right knee surgery    . Coronary angioplasty    . Hemorrhoid surgery     Social History:  reports that he has quit smoking. He does not have any smokeless tobacco history on file. He reports that he does not drink alcohol or use illicit drugs.  No Known Allergies  Family History  Problem Relation Age of Onset  . Anesthesia problems Neg Hx   . Hypotension Neg Hx   . Malignant hyperthermia Neg Hx   . Pseudochol deficiency Neg Hx   . Diabetes Other   . Hypertension Other      Prior to Admission medications   Medication Sig Start Date End Date Taking? Authorizing Provider  aspirin 81 MG tablet Take 81 mg by mouth every morning.    Yes Historical Provider, MD  GARLIC PO Take 1 capsule by mouth every morning.   Yes Historical Provider, MD  hydrochlorothiazide (HYDRODIURIL) 25 MG tablet Take 25 mg by mouth every morning.    Yes Historical Provider, MD  ibuprofen (ADVIL,MOTRIN) 200 MG tablet Take 400-800 mg by mouth every 8 (eight) hours as needed for moderate pain.   Yes Historical Provider, MD  lisinopril (PRINIVIL,ZESTRIL) 20 MG tablet Take 20 mg by mouth every morning.  Yes Historical Provider, MD  metFORMIN (GLUCOPHAGE) 500 MG tablet Take 1,000 mg by mouth 2 (two) times daily with a meal.    Yes Historical Provider, MD  simvastatin (ZOCOR) 20 MG tablet Take 20 mg by mouth every evening.   Yes Historical Provider, MD  metoprolol succinate (TOPROL-XL) 25 MG 24 hr tablet Take 25 mg by mouth every morning.     Historical Provider, MD  pantoprazole (PROTONIX) 40 MG tablet Take 40 mg by mouth every morning.    Historical Provider, MD   Physical Exam: Filed Vitals:   06/12/13 0809  BP: 138/90  Pulse: 63   Temp: 97.4 F (36.3 C)  Resp: 16    BP 138/90  Pulse 63  Temp(Src) 97.4 F (36.3 C) (Oral)  Resp 16  Ht 6\' 1"  (1.854 m)  Wt 89.6 kg (197 lb 8.5 oz)  BMI 26.07 kg/m2  SpO2 99%  General:  Appears calm and comfortable Eyes: PERRL, normal lids, irises & conjunctiva ENT: grossly normal hearing, lips & tongue Neck: no LAD, masses or thyromegaly Cardiovascular: RRR, no m/r/g. No LE edema. Telemetry: SR, no arrhythmias  Respiratory: CTA bilaterally, no w/r/r. Normal respiratory effort. Abdomen: soft, mild periumbilical tenderness, BS present Skin: no rash or induration seen on limited exam Musculoskeletal: grossly normal tone BUE/BLE Psychiatric: grossly normal mood and affect, speech fluent and appropriate Neurologic: grossly non-focal.          Labs on Admission:  Basic Metabolic Panel:  Recent Labs Lab 06/12/13 0435  NA 134*  K 3.5  CL 96  CO2 27  GLUCOSE 113*  BUN 10  CREATININE 0.61  CALCIUM 9.3   Liver Function Tests: No results found for this basename: AST, ALT, ALKPHOS, BILITOT, PROT, ALBUMIN,  in the last 168 hours No results found for this basename: LIPASE, AMYLASE,  in the last 168 hours No results found for this basename: AMMONIA,  in the last 168 hours CBC:  Recent Labs Lab 06/12/13 0435  WBC 5.7  NEUTROABS 3.0  HGB 12.6*  HCT 36.9*  MCV 82.0  PLT 225   Cardiac Enzymes:  Recent Labs Lab 06/12/13 0435  TROPONINI <0.30    BNP (last 3 results) No results found for this basename: PROBNP,  in the last 8760 hours CBG: No results found for this basename: GLUCAP,  in the last 168 hours  Radiological Exams on Admission: Ct Head Wo Contrast  06/12/2013   CLINICAL DATA:  Dizziness and vision changes  EXAM: CT HEAD WITHOUT CONTRAST  TECHNIQUE: Contiguous axial images were obtained from the base of the skull through the vertex without intravenous contrast.  COMPARISON:  None.  FINDINGS: Skull and Sinuses:No significant abnormality.  Orbits: No  acute abnormality.  Brain: No evidence of acute abnormality, such as acute infarction, hemorrhage, hydrocephalus, or mass lesion/mass effect. Mild age related cerebral volume decrease.  IMPRESSION: No evidence of acute intracranial disease.   Electronically Signed   By: Tiburcio Pea M.D.   On: 06/12/2013 05:59   Dg Chest Port 1 View  06/12/2013   CLINICAL DATA:  Hypertension.  EXAM: PORTABLE CHEST - 1 VIEW  COMPARISON:  10/12/2006  FINDINGS: Mild cardiomegaly which is stable. No acute change in the upper mediastinal contours. No infiltrate, edema, effusion, or pneumothorax.  IMPRESSION: No active disease.   Electronically Signed   By: Tiburcio Pea M.D.   On: 06/12/2013 05:46    EKG: Independently reviewed. Mild ST elevation in anterior leads, no old EKG for comparison  Assessment/Plan  Active Problems:   Chest pain   1. Chest pain -concerning for unstable angina -EKG with some ST changes in anterior leads no old EKG for comparison -ASA 325mg  daily -continue metoprolol/statin -cycle cardiac enzymes, check ECHO -will request Cardiology consult -check lipids in am  2. DM -hold metformin, SSI  3. HTN with accelerated HTN -improved now, continue home meds  4. DIzziness/blurry vision -suspect this was related to accerlated HTN -improved now, CT normal, if recurs could get MRI   Code Status: full code Family Communication: d/w wife and daughter at bedside Disposition Plan: observation Time spent:26min  [facility] Pager [phone]

## 2013-06-12 NOTE — ED Provider Notes (Signed)
Medical screening examination/treatment/procedure(s) were conducted as a shared visit with non-physician practitioner(s) and myself.  I personally evaluated the patient during the encounter. Patient presents with episodic chest pain and episode of vertiginous sounding symptoms. He has multiple risk factors for coronary artery disease and CVA. Will likely need MRI. Have discussed with hospitalist and will admit.  EKG Interpretation    Date/Time:  Saturday June 12 2013 04:12:18 EST Ventricular Rate:  79 PR Interval:  208 QRS Duration: 104 QT Interval:  361 QTC Calculation: 414 R Axis:   67 Text Interpretation:  Sinus rhythm Borderline prolonged PR interval Minimal ST elevation, anterior leads Baseline wander in lead(s) V3 Confirmed by Ranae Palms  MD, Marita Burnsed ([id number]) on 06/12/2013 11:55:16 PM              Loren Racer, MD 06/12/13 2356

## 2013-06-12 NOTE — ED Notes (Signed)
Patient transported to CT 

## 2013-06-12 NOTE — ED Provider Notes (Signed)
Medical screening examination/treatment/procedure(s) were performed by non-physician practitioner and as supervising physician I was immediately available for consultation/collaboration.  EKG Interpretation    Date/Time:  Saturday June 12 2013 04:12:18 EST Ventricular Rate:  79 PR Interval:  208 QRS Duration: 104 QT Interval:  361 QTC Calculation: 414 R Axis:   67 Text Interpretation:  Sinus rhythm Borderline prolonged PR interval Minimal ST elevation, anterior leads Baseline wander in lead(s) V3 Confirmed by Ranae Palms  MD, Arsalan Brisbin ([id number]) on 06/12/2013 11:55:16 PM             Loren Racer, MD 06/12/13 2356

## 2013-06-12 NOTE — ED Provider Notes (Signed)
Discussed case with Ruby Cola, PA-C. Transfer of care from [employer], [employer].   Brendan Gonzales is a 57 y/o M with PMHx of HTN, DM, GERD, HLD presenting to the ED with chest pain that started at 4:00AM yesterday morning that was described as a constant pressure sensation, aggravated by activity. No shortness of breath noted. Episode of dizziness that lasted 5 minutes with associated ataxic motions and blurred vision occurred while work at 3:00AM this morning. Headache, frontal aspect, since 11:00AM.  Results for orders placed during the hospital encounter of 06/12/13  CBC      Result Value Range   WBC 5.7  4.0 - 10.5 K/uL   RBC 4.50  4.22 - 5.81 MIL/uL   Hemoglobin 12.6 (*) 13.0 - 17.0 g/dL   HCT 16.1 (*) 09.6 - 04.5 %   MCV 82.0  78.0 - 100.0 fL   MCH 28.0  26.0 - 34.0 pg   MCHC 34.1  30.0 - 36.0 g/dL   RDW 40.9  81.1 - 91.4 %   Platelets 225  150 - 400 K/uL  BASIC METABOLIC PANEL      Result Value Range   Sodium 134 (*) 135 - 145 mEq/L   Potassium 3.5  3.5 - 5.1 mEq/L   Chloride 96  96 - 112 mEq/L   CO2 27  19 - 32 mEq/L   Glucose, Bld 113 (*) 70 - 99 mg/dL   BUN 10  6 - 23 mg/dL   Creatinine, Ser 7.82  0.50 - 1.35 mg/dL   Calcium 9.3  8.4 - 95.6 mg/dL   GFR calc non Af Amer >90  >90 mL/min   GFR calc Af Amer >90  >90 mL/min  TROPONIN I      Result Value Range   Troponin I <0.30  <0.30 ng/mL  DIFFERENTIAL      Result Value Range   Neutrophils Relative % 55  43 - 77 %   Neutro Abs 3.0  1.7 - 7.7 K/uL   Lymphocytes Relative 32  12 - 46 %   Lymphs Abs 1.8  0.7 - 4.0 K/uL   Monocytes Relative 10  3 - 12 %   Monocytes Absolute 0.5  0.1 - 1.0 K/uL   Eosinophils Relative 3  0 - 5 %   Eosinophils Absolute 0.2  0.0 - 0.7 K/uL   Basophils Relative 0  0 - 1 %   Basophils Absolute 0.0  0.0 - 0.1 K/uL  URINALYSIS, ROUTINE W REFLEX MICROSCOPIC      Result Value Range   Color, Urine YELLOW  YELLOW   APPearance CLEAR  CLEAR   Specific Gravity, Urine 1.003 (*) 1.005 -  1.030   pH 6.0  5.0 - 8.0   Glucose, UA NEGATIVE  NEGATIVE mg/dL   Hgb urine dipstick NEGATIVE  NEGATIVE   Bilirubin Urine NEGATIVE  NEGATIVE   Ketones, ur NEGATIVE  NEGATIVE mg/dL   Protein, ur NEGATIVE  NEGATIVE mg/dL   Urobilinogen, UA 0.2  0.0 - 1.0 mg/dL   Nitrite NEGATIVE  NEGATIVE   Leukocytes, UA NEGATIVE  NEGATIVE   Ct Head Wo Contrast  06/12/2013   CLINICAL DATA:  Dizziness and vision changes  EXAM: CT HEAD WITHOUT CONTRAST  TECHNIQUE: Contiguous axial images were obtained from the base of the skull through the vertex without intravenous contrast.  COMPARISON:  None.  FINDINGS: Skull and Sinuses:No significant abnormality.  Orbits: No acute abnormality.  Brain: No evidence of acute abnormality, such as acute infarction,  hemorrhage, hydrocephalus, or mass lesion/mass effect. Mild age related cerebral volume decrease.  IMPRESSION: No evidence of acute intracranial disease.   Electronically Signed   By: Tiburcio Pea M.D.   On: 06/12/2013 05:59   Dg Chest Port 1 View  06/12/2013   CLINICAL DATA:  Hypertension.  EXAM: PORTABLE CHEST - 1 VIEW  COMPARISON:  10/12/2006  FINDINGS: Mild cardiomegaly which is stable. No acute change in the upper mediastinal contours. No infiltrate, edema, effusion, or pneumothorax.  IMPRESSION: No active disease.   Electronically Signed   By: Tiburcio Pea M.D.   On: 06/12/2013 05:46   Medications  aspirin tablet 325 mg (325 mg Oral Given 06/12/13 0438)  gi cocktail (Maalox,Lidocaine,Donnatal) (30 mLs Oral Given 06/12/13 0530)  pantoprazole (PROTONIX) injection 40 mg (40 mg Intravenous Given 06/12/13 0527)   Filed Vitals:   06/12/13 0401 06/12/13 0434 06/12/13 0532 06/12/13 0638  BP: 146/85 159/94 131/83 135/87  Pulse: 76 76 72 67  Temp: 98.3 F (36.8 C)     TempSrc: Oral     Resp: 16 16 16 14   Weight: 225 lb (102.059 kg)     SpO2: 99% 97% 97% 99%   7:12 AM This provider spoke with Dr. Mitchel Honour - discussed patient's history, case,  presentation. Patient to be admitted to the hospital for cardiac rule out and possible TIA. Patient admitted to Telemetry, observation.   Diagnoses that have been ruled out:  None  Diagnoses that are still under consideration:  None  Final diagnoses:  Chest pain  HTN (hypertension)  Dizziness     Raymon Mutton, PA-C 06/12/13 1712

## 2013-06-12 NOTE — Consult Note (Signed)
Reason for Consult: Chest pain, HTN  Requesting Physician: Jomarie Longs  Cardiologist: Previously Dr. Elsie Lincoln  HPI: This is a 57 y.o. male with a past medical history significant for normal coronary angiography in 2007, but numerous coronary risk factors(diabetes mellitus, hypertension, hyperlipidemia). He has had a couple of episodes of chest pain that occurred at rest but was aggravated by activity associated with a frontal headache, blurred vision and lack of coordination. His blood pressure was mildly elevated on one occasion and severely elevated the second time. He no longer has chest pressure but continues to have a headache. Head CT is unremarkable as is his chest x-ray. His electrocardiogram is probably normal, with very subtle nonspecific ST segment changes. Cardiac enzymes are normal. He has run out of a couple of medications including hydrochlorothiazide, metoprolol succinate and pantoprazole. He did not have any more refills on his prescriptions.  His electronic chart lists femoral bypass as previous surgery, but I think this is an error. He does not remember ever having such a procedure. Cannot find a surgical scar to suggest this procedure. He has had previous knee surgery.   PMHx:  Past Medical History  Diagnosis Date  . Hypertension     benign  . Diabetes mellitus     type 2  . Arthritis   . Headache(784.0)   . Hyperlipidemia   . Constipation   . Dizziness    Past Surgical History  Procedure Laterality Date  . Colonoscopy    . Right femeroral bypass    . Right knee surgery    . Coronary angioplasty    . Hemorrhoid surgery      FAMHx: Family History  Problem Relation Age of Onset  . Anesthesia problems Neg Hx   . Hypotension Neg Hx   . Malignant hyperthermia Neg Hx   . Pseudochol deficiency Neg Hx   . Diabetes Other   . Hypertension Other     SOCHx:  reports that he has quit smoking. He does not have any smokeless tobacco history on file. He  reports that he does not drink alcohol or use illicit drugs.  ALLERGIES: No Known Allergies  ROS: Constitutional: positive for fatigue, negative for chills, fevers and weight loss Eyes: negative for visual disturbance Ears, nose, mouth, throat, and face: negative for hoarseness, snoring and sore throat Respiratory: negative for cough, dyspnea on exertion, stridor and wheezing Cardiovascular: positive for chest pressure/discomfort Gastrointestinal: positive for dyspepsia and reflux symptoms Genitourinary:negative Integument/breast: negative Hematologic/lymphatic: negative for bleeding, easy bruising and petechiae Musculoskeletal:positive for arthralgias Neurological: negative for memory problems, seizures and speech problems Behavioral/Psych: negative Endocrine: negative Allergic/Immunologic: negative  HOME MEDICATIONS: Prescriptions prior to admission  Medication Sig Dispense Refill  . aspirin 81 MG tablet Take 81 mg by mouth every morning.       Marland Kitchen GARLIC PO Take 1 capsule by mouth every morning.      . hydrochlorothiazide (HYDRODIURIL) 25 MG tablet Take 25 mg by mouth every morning.       Marland Kitchen ibuprofen (ADVIL,MOTRIN) 200 MG tablet Take 400-800 mg by mouth every 8 (eight) hours as needed for moderate pain.      Marland Kitchen lisinopril (PRINIVIL,ZESTRIL) 20 MG tablet Take 20 mg by mouth every morning.       . metFORMIN (GLUCOPHAGE) 500 MG tablet Take 1,000 mg by mouth 2 (two) times daily with a meal.       . simvastatin (ZOCOR) 20 MG tablet Take 20 mg by mouth every evening.      Marland Kitchen  metoprolol succinate (TOPROL-XL) 25 MG 24 hr tablet Take 25 mg by mouth every morning.       . pantoprazole (PROTONIX) 40 MG tablet Take 40 mg by mouth every morning.        HOSPITAL MEDICATIONS: I have reviewed the patient's current medications. Prior to Admission:  Prescriptions prior to admission  Medication Sig Dispense Refill  . aspirin 81 MG tablet Take 81 mg by mouth every morning.       Marland Kitchen GARLIC PO Take  1 capsule by mouth every morning.      . hydrochlorothiazide (HYDRODIURIL) 25 MG tablet Take 25 mg by mouth every morning.       Marland Kitchen ibuprofen (ADVIL,MOTRIN) 200 MG tablet Take 400-800 mg by mouth every 8 (eight) hours as needed for moderate pain.      Marland Kitchen lisinopril (PRINIVIL,ZESTRIL) 20 MG tablet Take 20 mg by mouth every morning.       . metFORMIN (GLUCOPHAGE) 500 MG tablet Take 1,000 mg by mouth 2 (two) times daily with a meal.       . simvastatin (ZOCOR) 20 MG tablet Take 20 mg by mouth every evening.      . metoprolol succinate (TOPROL-XL) 25 MG 24 hr tablet Take 25 mg by mouth every morning.       . pantoprazole (PROTONIX) 40 MG tablet Take 40 mg by mouth every morning.        VITALS: Blood pressure 143/80, pulse 69, temperature 97.8 F (36.6 C), temperature source Oral, resp. rate 16, height 6\' 1"  (1.854 m), weight 197 lb 8.5 oz (89.6 kg), SpO2 98.00%.  PHYSICAL EXAM: General appearance: alert, cooperative and no distress Neck: no adenopathy, no carotid bruit, no JVD, supple, symmetrical, trachea midline and thyroid not enlarged, symmetric, no tenderness/mass/nodules Lungs: clear to auscultation bilaterally Heart: regular rate and rhythm, S1, S2 normal, no murmur, click, rub or gallop Abdomen: soft, non-tender; bowel sounds normal; no masses,  no organomegaly Extremities: extremities normal, atraumatic, no cyanosis or edema Pulses: 2+ and symmetric Skin: Skin color, texture, turgor normal. No rashes or lesions Neurologic: Alert and oriented X 3, normal strength and tone. Normal symmetric reflexes. Normal coordination and gait  LABS: Results for orders placed during the hospital encounter of 06/12/13 (from the past 48 hour(s))  CBC     Status: Abnormal   Collection Time    06/12/13  4:35 AM      Result Value Range   WBC 5.7  4.0 - 10.5 K/uL   RBC 4.50  4.22 - 5.81 MIL/uL   Hemoglobin 12.6 (*) 13.0 - 17.0 g/dL   HCT 16.1 (*) 09.6 - 04.5 %   MCV 82.0  78.0 - 100.0 fL   MCH 28.0   26.0 - 34.0 pg   MCHC 34.1  30.0 - 36.0 g/dL   RDW 40.9  81.1 - 91.4 %   Platelets 225  150 - 400 K/uL  BASIC METABOLIC PANEL     Status: Abnormal   Collection Time    06/12/13  4:35 AM      Result Value Range   Sodium 134 (*) 135 - 145 mEq/L   Potassium 3.5  3.5 - 5.1 mEq/L   Chloride 96  96 - 112 mEq/L   CO2 27  19 - 32 mEq/L   Glucose, Bld 113 (*) 70 - 99 mg/dL   BUN 10  6 - 23 mg/dL   Creatinine, Ser 7.82  0.50 - 1.35 mg/dL   Calcium 9.3  8.4 -  10.5 mg/dL   GFR calc non Af Amer >90  >90 mL/min   GFR calc Af Amer >90  >90 mL/min   Comment: (NOTE)     The eGFR has been calculated using the CKD EPI equation.     This calculation has not been validated in all clinical situations.     eGFR's persistently <90 mL/min signify possible Chronic Kidney     Disease.  TROPONIN I     Status: None   Collection Time    06/12/13  4:35 AM      Result Value Range   Troponin I <0.30  <0.30 ng/mL   Comment:            Due to the release kinetics of cTnI,     a negative result within the first hours     of the onset of symptoms does not rule out     myocardial infarction with certainty.     If myocardial infarction is still suspected,     repeat the test at appropriate intervals.  DIFFERENTIAL     Status: None   Collection Time    06/12/13  4:35 AM      Result Value Range   Neutrophils Relative % 55  43 - 77 %   Neutro Abs 3.0  1.7 - 7.7 K/uL   Lymphocytes Relative 32  12 - 46 %   Lymphs Abs 1.8  0.7 - 4.0 K/uL   Monocytes Relative 10  3 - 12 %   Monocytes Absolute 0.5  0.1 - 1.0 K/uL   Eosinophils Relative 3  0 - 5 %   Eosinophils Absolute 0.2  0.0 - 0.7 K/uL   Basophils Relative 0  0 - 1 %   Basophils Absolute 0.0  0.0 - 0.1 K/uL  URINALYSIS, ROUTINE W REFLEX MICROSCOPIC     Status: Abnormal   Collection Time    06/12/13  5:42 AM      Result Value Range   Color, Urine YELLOW  YELLOW   APPearance CLEAR  CLEAR   Specific Gravity, Urine 1.003 (*) 1.005 - 1.030   pH 6.0  5.0 - 8.0    Glucose, UA NEGATIVE  NEGATIVE mg/dL   Hgb urine dipstick NEGATIVE  NEGATIVE   Bilirubin Urine NEGATIVE  NEGATIVE   Ketones, ur NEGATIVE  NEGATIVE mg/dL   Protein, ur NEGATIVE  NEGATIVE mg/dL   Urobilinogen, UA 0.2  0.0 - 1.0 mg/dL   Nitrite NEGATIVE  NEGATIVE   Leukocytes, UA NEGATIVE  NEGATIVE   Comment: MICROSCOPIC NOT DONE ON URINES WITH NEGATIVE PROTEIN, BLOOD, LEUKOCYTES, NITRITE, OR GLUCOSE <1000 mg/dL.  TROPONIN I     Status: None   Collection Time    06/12/13  9:53 AM      Result Value Range   Troponin I <0.30  <0.30 ng/mL   Comment:            Due to the release kinetics of cTnI,     a negative result within the first hours     of the onset of symptoms does not rule out     myocardial infarction with certainty.     If myocardial infarction is still suspected,     repeat the test at appropriate intervals.    IMAGING: Ct Head Wo Contrast  06/12/2013   CLINICAL DATA:  Dizziness and vision changes  EXAM: CT HEAD WITHOUT CONTRAST  TECHNIQUE: Contiguous axial images were obtained from the base of the skull through  the vertex without intravenous contrast.  COMPARISON:  None.  FINDINGS: Skull and Sinuses:No significant abnormality.  Orbits: No acute abnormality.  Brain: No evidence of acute abnormality, such as acute infarction, hemorrhage, hydrocephalus, or mass lesion/mass effect. Mild age related cerebral volume decrease.  IMPRESSION: No evidence of acute intracranial disease.   Electronically Signed   By: Tiburcio Pea M.D.   On: 06/12/2013 05:59   Dg Chest Port 1 View  06/12/2013   CLINICAL DATA:  Hypertension.  EXAM: PORTABLE CHEST - 1 VIEW  COMPARISON:  10/12/2006  FINDINGS: Mild cardiomegaly which is stable. No acute change in the upper mediastinal contours. No infiltrate, edema, effusion, or pneumothorax.  IMPRESSION: No active disease.   Electronically Signed   By: Tiburcio Pea M.D.   On: 06/12/2013 05:46    IMPRESSION: Atypical chest pain associated with  elevated BP and medication noncompliance. 1. Consider rebound HTN from stopping metoprolol, but he was on a small dose 2. Reasonable to pursue nuclear stress test, but if he is asymptomatic and we cannot get it done tomorrow, I think it is safe to do as an outpatient   Time Spent Directly with Patient: 30 minutes  Thurmon Fair, MD, [facility] [phone] office [phone] pager  06/12/2013, 3:13 PM

## 2013-06-13 ENCOUNTER — Ambulatory Visit (HOSPITAL_COMMUNITY)
Admit: 2013-06-13 | Discharge: 2013-06-13 | Disposition: A | Discharge status: Home / Self Care | Payer: BC Managed Care – PPO | Attending: Cardiology | Admitting: Cardiology

## 2013-06-13 ENCOUNTER — Observation Stay (HOSPITAL_COMMUNITY)
Admit: 2013-06-13 | Discharge: 2013-06-13 | Disposition: A | Discharge status: Home / Self Care | Payer: BC Managed Care – PPO | Attending: Cardiology | Admitting: Cardiology

## 2013-06-13 ENCOUNTER — Observation Stay (HOSPITAL_COMMUNITY): Payer: BC Managed Care – PPO

## 2013-06-13 ENCOUNTER — Other Ambulatory Visit: Payer: Self-pay

## 2013-06-13 DIAGNOSIS — E119 Type 2 diabetes mellitus without complications: Secondary | ICD-10-CM | POA: Diagnosis present

## 2013-06-13 DIAGNOSIS — I10 Essential (primary) hypertension: Secondary | ICD-10-CM | POA: Diagnosis present

## 2013-06-13 DIAGNOSIS — R079 Chest pain, unspecified: Secondary | ICD-10-CM | POA: Insufficient documentation

## 2013-06-13 DIAGNOSIS — IMO0001 Reserved for inherently not codable concepts without codable children: Secondary | ICD-10-CM

## 2013-06-13 DIAGNOSIS — I359 Nonrheumatic aortic valve disorder, unspecified: Secondary | ICD-10-CM

## 2013-06-13 DIAGNOSIS — K219 Gastro-esophageal reflux disease without esophagitis: Secondary | ICD-10-CM

## 2013-06-13 LAB — BASIC METABOLIC PANEL
BUN: 13 mg/dL (ref 6–23)
Calcium: 9.2 mg/dL (ref 8.4–10.5)
Creatinine, Ser: 0.64 mg/dL (ref 0.50–1.35)
GFR calc Af Amer: >90 mL/min (ref >90)
GFR calc non Af Amer: >90 mL/min (ref >90)
Glucose, Bld: 110 mg/dL — ABNORMAL HIGH (ref 70–99)
Potassium: 3.9 mEq/L (ref 3.5–5.1)

## 2013-06-13 LAB — CBC
Hemoglobin: 12.4 g/dL — ABNORMAL LOW (ref 13.0–17.0)
MCH: 27.9 pg (ref 26.0–34.0)
MCHC: 33.9 g/dL (ref 30.0–36.0)
Platelets: 223 10*3/uL (ref 150–400)
RDW: 13.3 % (ref 11.5–15.5)

## 2013-06-13 LAB — LIPID PANEL
Cholesterol: 153 mg/dL (ref 0–200)
Total CHOL/HDL Ratio: 3.2 RATIO
Triglycerides: 83 mg/dL (ref <150)
VLDL: 17 mg/dL (ref 0–40)

## 2013-06-13 MED ORDER — PANTOPRAZOLE SODIUM 40 MG PO TBEC: 40.0000 mg | DELAYED_RELEASE_TABLET | Freq: Every day | ORAL | Status: DC

## 2013-06-13 MED ORDER — METOPROLOL SUCCINATE ER 25 MG PO TB24: 25.0000 mg | ORAL_TABLET | Freq: Every morning | ORAL | Status: DC

## 2013-06-13 MED ORDER — ACETAMINOPHEN 325 MG PO TABS
650.0000 mg | ORAL_TABLET | Freq: Once | ORAL | Status: AC
  Administered 2013-06-13: 650 mg via ORAL

## 2013-06-13 MED ORDER — TECHNETIUM TC 99M SESTAMIBI - CARDIOLITE
10.0000 | Freq: Once | INTRAVENOUS | Status: AC | PRN
  Administered 2013-06-13: 10 via INTRAVENOUS

## 2013-06-13 MED ORDER — TECHNETIUM TC 99M SESTAMIBI - CARDIOLITE
30.0000 | Freq: Once | INTRAVENOUS | Status: AC | PRN
  Administered 2013-06-13: 10:00:00 30 via INTRAVENOUS

## 2013-06-13 MED ORDER — REGADENOSON 0.4 MG/5ML IV SOLN
0.4000 mg | Freq: Once | INTRAVENOUS | Status: AC
  Administered 2013-06-13: 0.4 mg via INTRAVENOUS

## 2013-06-13 MED ORDER — ACETAMINOPHEN 325 MG PO TABS
ORAL_TABLET | ORAL | Status: AC
  Filled 2013-06-13: qty 2

## 2013-06-13 NOTE — Progress Notes (Signed)
Utilization Review completed.  

## 2013-06-13 NOTE — Discharge Summary (Signed)
Physician Discharge Summary  Brendan Gonzales EAV:[id number] DOB: 1956/01/12 DOA: 06/12/2013  PCP: No primary provider on file.  Admit date: 06/12/2013 Discharge date: 06/13/2013  Time spent: 30 minutes  Recommendations for Outpatient Follow-up:  1. Please follow up on Mr Omen's chest pain  Discharge Diagnoses:  Active Problems:   Chest pain with moderate risk of acute coronary syndrome   Type 2 diabetes mellitus   HTN (hypertension)   Normal coronary arteries- 2008   Discharge Condition: Stable/Improved  Diet recommendation: Heart Healthy Diet  Filed Weights   06/12/13 0401 06/12/13 0809  Weight: 102.059 kg (225 lb) 89.6 kg (197 lb 8.5 oz)    History of present illness:  Brendan Gonzales is a 57 y.o. male with H/o DM, HTN, dyslipidemia was in his usual state of health till yesterday, he started experiencing chest pain which he described as chest pressure non radiating, associated with nausea, this occurred intermittently yesterday, he checked his BP and noted that it was much higher than usual too.  He went to work, and at 2:30am started experiencing chest pressure again with nausea and diaphoresis, his BP now was 225 systolic. Around this time noticed blurry vision and dizziness. Then called his supervisor and his family and was brought to the ER for further evaluation.  His EKG shows some mild elevation of the ST segments in anterior leads, chest pain improved now, [facility] consulted for further management.  Hospital Course:  Patient is a pleasant 57 year old gentleman with a past medical history of type 2 diabetes mellitus, hypertension, dyslipidemia, who was admitted to the medicine service on 06/12/2013, presenting with atypical chest discomfort. Initial EKG did not reveal ischemic changes as troponin was within normal limits. He was admitted to telemetry where troponin was cycled x3 sets overnight and remained negative. Cardiology was consulted as patient was seen and evaluated by Dr  Royann Shivers. By the following morning his chest pain symptoms resolved. He underwent a nuclear stress test which did not reveal ischemia. I suspect his chest pain is secondary to gastroesophageal reflux disease as he reported running out of his Protonix 3 weeks ago. Given clinical stability, he was discharged in stable condition and instructed to followup with his primary care provider in 10-14 days.  Procedures:  Nuclear stress test performed on 06/13/2013 impression: Negative for pharmacologic stress-induced ischemia, ejection fraction 61%.  Consultations:  Cardiology  Discharge Exam: Filed Vitals:   06/13/13 1345  BP: 113/66  Pulse: 65  Temp: 97.7 F (36.5 C)  Resp: 20    General: Patient reports feeling better, in no acute distress, denies chest pain. Cardiovascular: Regular rate rhythm normal S1-S2 Respiratory: Lungs are clear to auscultation bilaterally no wheezing rhonchi or rales Abdomen: Soft nontender nondistended positive bowel sounds in upper quadrant  Discharge Instructions  Discharge Orders   Future Appointments Provider Department Dept Phone   06/21/2013 3:00 PM [name], MD [facility] [phone]   Future Orders Complete By Expires   Call MD for:  difficulty breathing, headache or visual disturbances  As directed    Call MD for:  extreme fatigue  As directed    Call MD for:  persistant dizziness or light-headedness  As directed    Call MD for:  persistant nausea and vomiting  As directed    Call MD for:  severe uncontrolled pain  As directed    Call MD for:  temperature >100.4  As directed    Diet - low sodium heart healthy  As directed  Discharge instructions  As directed    Comments:     Please follow up with your family physician in 1-2 weeks.   Increase activity slowly  As directed        Medication List         aspirin 81 MG tablet  Take 81 mg by mouth every morning.     GARLIC PO  Take 1 capsule by mouth every morning.      hydrochlorothiazide 25 MG tablet  Commonly known as:  HYDRODIURIL  Take 25 mg by mouth every morning.     ibuprofen 200 MG tablet  Commonly known as:  ADVIL,MOTRIN  Take 400-800 mg by mouth every 8 (eight) hours as needed for moderate pain.     lisinopril 20 MG tablet  Commonly known as:  PRINIVIL,ZESTRIL  Take 20 mg by mouth every morning.     metFORMIN 500 MG tablet  Commonly known as:  GLUCOPHAGE  Take 1,000 mg by mouth 2 (two) times daily with a meal.     metoprolol succinate 25 MG 24 hr tablet  Commonly known as:  TOPROL-XL  Take 1 tablet (25 mg total) by mouth every morning.     pantoprazole 40 MG tablet  Commonly known as:  PROTONIX  Take 1 tablet (40 mg total) by mouth daily.     simvastatin 20 MG tablet  Commonly known as:  ZOCOR  Take 20 mg by mouth every evening.       No Known Allergies    The results of significant diagnostics from this hospitalization (including imaging, microbiology, ancillary and laboratory) are listed below for reference.    Significant Diagnostic Studies: Ct Head Wo Contrast  06/12/2013   CLINICAL DATA:  Dizziness and vision changes  EXAM: CT HEAD WITHOUT CONTRAST  TECHNIQUE: Contiguous axial images were obtained from the base of the skull through the vertex without intravenous contrast.  COMPARISON:  None.  FINDINGS: Skull and Sinuses:No significant abnormality.  Orbits: No acute abnormality.  Brain: No evidence of acute abnormality, such as acute infarction, hemorrhage, hydrocephalus, or mass lesion/mass effect. Mild age related cerebral volume decrease.  IMPRESSION: No evidence of acute intracranial disease.   Electronically Signed   By: Tiburcio Pea M.D.   On: 06/12/2013 05:59   Nm Myocar Multi W/spect W/wall Motion / Ef  06/13/2013   CLINICAL DATA:  Chest pain  EXAM: MYOCARDIAL IMAGING WITH SPECT (REST AND PHARMACOLOGIC-STRESS)  GATED LEFT VENTRICULAR WALL MOTION STUDY  LEFT VENTRICULAR EJECTION FRACTION  TECHNIQUE: Standard  myocardial SPECT imaging was performed after resting intravenous injection of 10 mCi Tc-53m sestamibi. Subsequently, intravenous infusion of Lexiscan was performed under the supervision of the Cardiology staff. At peak effect of the drug, 30 mCi Tc-42m sestamibi was injected intravenously and standard myocardial SPECT imaging was performed. Quantitative gated imaging was also performed to evaluate left ventricular wall motion, and estimate left ventricular ejection fraction.  COMPARISON:  None.  FINDINGS: The stress SPECT images demonstrate physiologic distribution of radiopharmaceutical. Rest images demonstrate artifact related to GI uptake.  The gated stress SPECT images demonstrate normal left ventricular myocardial thickening. No focal wall motion abnormality is seen.  Calculated left ventricular end-diastolic volume 115 mL, end-systolic volume 45 mL, ejection fraction of 61%.  IMPRESSION: 1. Negative for pharmacologic-stress induced ischemia.  2. Left ventricular ejection fraction 61%.   Electronically Signed   By: Charline Bills M.D.   On: 06/13/2013 15:32   Dg Chest Port 1 View  06/12/2013   CLINICAL  DATA:  Hypertension.  EXAM: PORTABLE CHEST - 1 VIEW  COMPARISON:  10/12/2006  FINDINGS: Mild cardiomegaly which is stable. No acute change in the upper mediastinal contours. No infiltrate, edema, effusion, or pneumothorax.  IMPRESSION: No active disease.   Electronically Signed   By: Tiburcio Pea M.D.   On: 06/12/2013 05:46    Microbiology: No results found for this or any previous visit (from the past 240 hour(s)).   Labs: Basic Metabolic Panel:  Recent Labs Lab 06/12/13 0435 06/13/13 0630  NA 134* 140  K 3.5 3.9  CL 96 102  CO2 27 30  GLUCOSE 113* 110*  BUN 10 13  CREATININE 0.61 0.64  CALCIUM 9.3 9.2   Liver Function Tests: No results found for this basename: AST, ALT, ALKPHOS, BILITOT, PROT, ALBUMIN,  in the last 168 hours No results found for this basename: LIPASE, AMYLASE,   in the last 168 hours No results found for this basename: AMMONIA,  in the last 168 hours CBC:  Recent Labs Lab 06/12/13 0435 06/13/13 0630  WBC 5.7 4.7  NEUTROABS 3.0  --   HGB 12.6* 12.4*  HCT 36.9* 36.6*  MCV 82.0 82.4  PLT 225 223   Cardiac Enzymes:  Recent Labs Lab 06/12/13 0435 06/12/13 0953 06/12/13 1530 06/12/13 2112  TROPONINI <0.30 <0.30 <0.30 <0.30   BNP: BNP (last 3 results) No results found for this basename: PROBNP,  in the last 8760 hours CBG: No results found for this basename: GLUCAP,  in the last 168 hours     Signed:  Jeralyn Bennett  [facility] 06/13/2013, 4:26 PM

## 2013-06-13 NOTE — Progress Notes (Deleted)
*  PRELIMINARY RESULTS* Echocardiogram 2D Echocardiogram has been performed.  Brendan Gonzales 06/13/2013, 2:25 PM

## 2013-06-13 NOTE — Progress Notes (Signed)
Echocardiogram 2D Echocardiogram has been performed.  Estelle Grumbles 06/13/2013, 2:25 PM

## 2013-06-13 NOTE — Progress Notes (Signed)
Subjective:  No chest pain overnight  Objective:  Vital Signs in the last 24 hours: Temp:  [97.8 F (36.6 C)-98.3 F (36.8 C)] 97.8 F (36.6 C) (12/21 0438) Pulse Rate:  [59-98] 91 (12/21 1022) Resp:  [16-20] 20 (12/21 1022) BP: (123-147)/(54-81) 139/59 mmHg (12/21 1022) SpO2:  [98 %-100 %] 99 % (12/21 0438)  Intake/Output from previous day:  Intake/Output Summary (Last 24 hours) at 06/13/13 1022 Last data filed at 06/12/13 1900  Gross per 24 hour  Intake    720 ml  Output    125 ml  Net    595 ml    Physical Exam: General appearance: alert, cooperative and no distress Lungs: clear to auscultation bilaterally Heart: regular rate and rhythm   Rate: 88  Rhythm: normal sinus rhythm  Lab Results:  Recent Labs  06/12/13 0435 06/13/13 0630  WBC 5.7 4.7  HGB 12.6* 12.4*  PLT 225 223    Recent Labs  06/12/13 0435 06/13/13 0630  NA 134* 140  K 3.5 3.9  CL 96 102  CO2 27 30  GLUCOSE 113* 110*  BUN 10 13  CREATININE 0.61 0.64    Recent Labs  06/12/13 1530 06/12/13 2112  TROPONINI <0.30 <0.30   No results found for this basename: INR,  in the last 72 hours  Imaging: Imaging results have been reviewed  Cardiac Studies:  Assessment/Plan:   Active Problems:   Chest pain with moderate risk of acute coronary syndrome   Type 2 diabetes mellitus   HTN (hypertension)   Normal coronary arteries- 2008    PLAN: Myoview today  Quintella Baton [phone] 06/13/2013, 10:22 AM   I have seen and examined the patient along with Corine Shelter PA-C.  I have reviewed the chart, notes and new data.  I agree with PA's note.  Key new complaints: still has a headache despite normalization of BP Key examination changes: no signs of CHF or arrhythmia Key new findings / data: undergoing Myoview right now  PLAN: If nuclear scan is low risk, OK to DC with outpatient follow up. Discussed risk of beta blocker rebound with sudden cessation.  Thurmon Fair, MD,  [facility] [phone] 06/13/2013, 11:09 AM

## 2013-06-13 NOTE — Progress Notes (Signed)
Brendan Gonzales was admitted to [facility] on 06/12/13, where he was treated for a medical condition. He was discharged on 06/13/13 to his home. I recommended he take 2 days off of work.   Sincerely,      Mahala Menghini MD

## 2013-06-21 ENCOUNTER — Encounter: Payer: Self-pay | Admitting: Family Medicine

## 2013-06-21 ENCOUNTER — Ambulatory Visit: Payer: BC Managed Care – PPO

## 2013-06-21 ENCOUNTER — Ambulatory Visit (INDEPENDENT_AMBULATORY_CARE_PROVIDER_SITE_OTHER): Payer: BC Managed Care – PPO | Admitting: Family Medicine

## 2013-06-21 VITALS — BP 141/83 | HR 65 | Temp 98.6°F | Resp 16 | Ht 75.5 in | Wt 195.4 lb

## 2013-06-21 DIAGNOSIS — K219 Gastro-esophageal reflux disease without esophagitis: Secondary | ICD-10-CM

## 2013-06-21 DIAGNOSIS — M25561 Pain in right knee: Secondary | ICD-10-CM

## 2013-06-21 DIAGNOSIS — M25569 Pain in unspecified knee: Secondary | ICD-10-CM

## 2013-06-21 DIAGNOSIS — R0789 Other chest pain: Secondary | ICD-10-CM

## 2013-06-21 DIAGNOSIS — R071 Chest pain on breathing: Secondary | ICD-10-CM

## 2013-06-21 DIAGNOSIS — E119 Type 2 diabetes mellitus without complications: Secondary | ICD-10-CM

## 2013-06-21 DIAGNOSIS — M79672 Pain in left foot: Secondary | ICD-10-CM

## 2013-06-21 DIAGNOSIS — I10 Essential (primary) hypertension: Secondary | ICD-10-CM

## 2013-06-21 DIAGNOSIS — M79609 Pain in unspecified limb: Secondary | ICD-10-CM

## 2013-06-21 DIAGNOSIS — R109 Unspecified abdominal pain: Secondary | ICD-10-CM

## 2013-06-21 DIAGNOSIS — R42 Dizziness and giddiness: Secondary | ICD-10-CM

## 2013-06-21 LAB — CBC WITH DIFFERENTIAL/PLATELET
Basophils Relative: 0 % (ref 0–1)
Eosinophils Absolute: 0.2 10*3/uL (ref 0.0–0.7)
Eosinophils Relative: 3 % (ref 0–5)
HCT: 38.9 % — ABNORMAL LOW (ref 39.0–52.0)
Hemoglobin: 13.1 g/dL (ref 13.0–17.0)
Lymphs Abs: 1.8 10*3/uL (ref 0.7–4.0)
MCH: 27.6 pg (ref 26.0–34.0)
MCV: 82.1 fL (ref 78.0–100.0)
Monocytes Absolute: 0.4 10*3/uL (ref 0.1–1.0)
Monocytes Relative: 8 % (ref 3–12)
Neutro Abs: 3.1 10*3/uL (ref 1.7–7.7)
Neutrophils Relative %: 56 % (ref 43–77)
RBC: 4.74 MIL/uL (ref 4.22–5.81)
RDW: 14.5 % (ref 11.5–15.5)

## 2013-06-21 LAB — GLUCOSE, POCT (MANUAL RESULT ENTRY): POC Glucose: 106 mg/dl — AB (ref 70–99)

## 2013-06-21 LAB — POCT GLYCOSYLATED HEMOGLOBIN (HGB A1C): Hemoglobin A1C: 6.1

## 2013-06-21 MED ORDER — HYDROCHLOROTHIAZIDE 25 MG PO TABS: 25.0000 mg | ORAL_TABLET | Freq: Every morning | ORAL | Status: DC

## 2013-06-21 MED ORDER — METFORMIN HCL 500 MG PO TABS: 500.0000 mg | ORAL_TABLET | Freq: Two times a day (BID) | ORAL | Status: DC

## 2013-06-21 MED ORDER — MELOXICAM 7.5 MG PO TABS: 7.5000 mg | ORAL_TABLET | Freq: Every day | ORAL | Status: DC

## 2013-06-21 MED ORDER — LISINOPRIL 20 MG PO TABS: 20.0000 mg | ORAL_TABLET | Freq: Every morning | ORAL | Status: DC

## 2013-06-21 MED ORDER — METOPROLOL SUCCINATE ER 25 MG PO TB24: 25.0000 mg | ORAL_TABLET | Freq: Every morning | ORAL | Status: DC

## 2013-06-21 MED ORDER — SIMVASTATIN 20 MG PO TABS: 20.0000 mg | ORAL_TABLET | Freq: Every evening | ORAL | Status: DC

## 2013-06-21 MED ORDER — PANTOPRAZOLE SODIUM 40 MG PO TBEC: 40.0000 mg | DELAYED_RELEASE_TABLET | Freq: Every day | ORAL | Status: DC

## 2013-06-21 NOTE — Progress Notes (Signed)
Subjective:    Patient ID: Brendan Gonzales, male    DOB: [birth year], 57 y.o.   MRN: [id number]  HPI Brendan Gonzales is a 57 y.o. male New pt to me, prior pt of [facility], then [facility] prior.   Here for a few concerns but also following up from hospitalization - admitted 06/12/13-06/13/13 with chest pain. Hx of DM2, HTN, dyslipidemia. Troponin cycled overnight and negative x 3. Cardiology consulted - chest pain resolved o/n. Nuclear stress test without ischemia, EF 61%.  Had been out of Protonix for 3 weeks - suspected GI source.   DM2 - home blood sugar readings - 135, 95, up and down. Taking metformin 1000mg  BID. Unknown last HGB A1c. No new side effects of meformin. Does feel dizzy after taking blood pressure medicine and metformin. Discharge hemoglobin 12.4. Dizzy after medicine but for about 10 years. Has discussed   HTN- needs med refills. Home Bp's: 154/106. Usual BP 140/88 at times. Few missed doses last week, but taking past few days.   GERD - taking prilosec QD now. Chest pains have now resolved.   He states he is here primarily today for pains: Pain for long time in R knee surgery in [facility] in 2013.  now pain in L knee for past year. No recent injury. Able to ambulate ok. Catching sometimes in R knee.   Some soreness in L foot, bump on top of foot past 2-3 weeks. NKI. No known hx of gout. Able to walk on ok.   Soreness in R chest area with lifting past 2 weeks. Felt pop with lifting one time. Took ibuprofen. Able to continue working, did not tell employer.   Soreness around belly button for years - no swelling, notices with lifting, has discussed with prior doctors.   Tx: 800mg  ibuprofen in the past - 1/2 -1 at a time would help.  Heavy lifting with work - works for [employer].   Review of Systems  Respiratory: Negative for cough, chest tightness and shortness of breath.   Cardiovascular: Positive for chest pain (chest wall only on right side. ).    Gastrointestinal: Negative for abdominal pain and blood in stool.  Musculoskeletal: Positive for arthralgias.  Neurological: Positive for dizziness.   Otherwise as above.     Objective:   Physical Exam  Vitals reviewed. Constitutional: He is oriented to person, place, and time. He appears well-developed and well-nourished.  HENT:  Head: Normocephalic and atraumatic.  Eyes: EOM are normal. Pupils are equal, round, and reactive to light.  Neck: No JVD present. Carotid bruit is not present.  Cardiovascular: Normal rate, regular rhythm, normal heart sounds and intact distal pulses.   No murmur heard. Pulmonary/Chest: Effort normal and breath sounds normal. He has no rales.  Abdominal: Soft. There is no tenderness. No hernia. Hernia confirmed negative in the ventral area.  Musculoskeletal: He exhibits no edema.       Right knee: He exhibits normal range of motion, no swelling, no effusion and no deformity. Tenderness found. Medial joint line tenderness noted.       Left knee: He exhibits normal range of motion, no swelling, no effusion and no deformity. Tenderness found. Medial joint line tenderness noted.       Left foot: He exhibits normal range of motion and no swelling.       Feet:  Neurological: He is alert and oriented to person, place, and time.  Microfilament testing of feet normal bilaterally.  Skin: Skin  is warm, dry and intact. No rash noted.  Psychiatric: He has a normal mood and affect. His behavior is normal.   Filed Vitals:   06/21/13 1452  BP: 141/83  Pulse: 65  Temp: 98.6 F (37 C)  TempSrc: Oral  Resp: 16  Height: 6' 3.5" (1.918 m)  Weight: 195 lb 6.4 oz (88.633 kg)  SpO2: 100%     Results for orders placed in visit on 06/21/13  GLUCOSE, POCT (MANUAL RESULT ENTRY)      Result Value Range   POC Glucose 106 (*) 70 - 99 mg/dl  POCT GLYCOSYLATED HEMOGLOBIN (HGB A1C)      Result Value Range   Hemoglobin A1C 6.1     [facility] reading (PRIMARY) by  Dr. Neva Seat:   L foot: no apparent fx.  R rib series: no apparent fx, no PTX.      Assessment & Plan:   [name] is a 57 y.o. male Left foot pain - Plan: DG Foot Complete Left, meloxicam (MOBIC) 7.5 MG tablet temporarily if needed. Recheck if not improving in next week or so.   Right-sided chest wall pain - Plan: DG Ribs Unilateral W/Chest Right, meloxicam (MOBIC) 7.5 MG tablet.  Strain/puulled muscle likely as no rib fx identified, sx care, mobic if needed, rtc precautions.   Unspecified essential hypertension - overall stable, cont same meds. metoprolol succinate (TOPROL-XL) 25 MG 24 hr tablet, lisinopril (PRINIVIL,ZESTRIL) 20 MG tablet, hydrochlorothiazide (HYDRODIURIL) 25 MG tablet -  Knee pain, bilateral - Plan: meloxicam (MOBIC) 7.5 MG tablet.  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  Trial of mobic and recheck at next ov.    Abdominal pain, unspecified site - Plan: DG Ribs Unilateral W/Chest Right - longstanding, no hernia appreciated and has been evaluated prior, but on recheck could consider surgery eval. rtc precautions.  GERD (gastroesophageal reflux disease) - Plan: pantoprazole (PROTONIX) 40 MG tablet - controlled now with resolved chest pain. Cont PPI.   Dizziness - longstanding, doubt from hypoglycemia as only on metformin, but with glycemic control - decrease metformin to 500mg  BID, and again can recheck this at next ov.   Diabetes - Plan: POCT glucose (manual entry), POCT glycosylated hemoglobin (Hb A1C), simvastatin (ZOCOR) 20 MG tablet, metFORMIN (GLUCOPHAGE) 500 MG tablet - controlled. - decrease to 500mg  BID metformin, keep record of readings for next ov.   Meds ordered this encounter  Medications  . simvastatin (ZOCOR) 20 MG tablet    Sig: Take 1 tablet (20 mg total) by mouth every evening.    Dispense:  90 tablet    Refill:  1  . metoprolol succinate (TOPROL-XL) 25 MG 24 hr tablet    Sig: Take 1 tablet (25 mg total) by mouth every morning.    Dispense:   90 tablet    Refill:  1  . metFORMIN (GLUCOPHAGE) 500 MG tablet    Sig: Take 1 tablet (500 mg total) by mouth 2 (two) times daily with a meal.    Dispense:  180 tablet    Refill:  1  . lisinopril (PRINIVIL,ZESTRIL) 20 MG tablet    Sig: Take 1 tablet (20 mg total) by mouth every morning.    Dispense:  90 tablet    Refill:  1  . hydrochlorothiazide (HYDRODIURIL) 25 MG tablet    Sig: Take 1 tablet (25 mg total) by mouth every morning.    Dispense:  90 tablet    Refill:  1  . pantoprazole (PROTONIX) 40 MG tablet  Sig: Take 1 tablet (40 mg total) by mouth daily.    Dispense:  90 tablet    Refill:  1  . meloxicam (MOBIC) 7.5 MG tablet    Sig: Take 1 tablet (7.5 mg total) by mouth daily.    Dispense:  30 tablet    Refill:  0   Patient Instructions  Decrease metformin to 500mg  q pill twice per day.  Keep a record of your blood pressures and blood sugar readings outside of the office and bring them to the next office visit. Tylenol for pain in chest wall, knees and foot, or if needed - meloxicam up to once per day. Do not take other NSAIDS like ibuprofen when taking this, and stop right away if any abdominal pain.  Recheck to discuss knees, stomach soreness, foot pain, dizziness and chest wall pain in next few weeks. Return to the clinic or go to the nearest emergency room if any of your symptoms worsen or new symptoms occur.  Chest Wall Pain Chest wall pain is pain in or around the bones and muscles of your chest. It may take up to 6 weeks to get better. It may take longer if you must stay physically active in your work and activities.  CAUSES  Chest wall pain may happen on its own. However, it may be caused by:  A viral illness like the flu.  Injury.  Coughing.  Exercise.  Arthritis.  Fibromyalgia.  Shingles. HOME CARE INSTRUCTIONS   Avoid overtiring physical activity. Try not to strain or perform activities that cause pain. This includes any activities using your chest or  your abdominal and side muscles, especially if heavy weights are used.  Put ice on the sore area.  Put ice in a plastic bag.  Place a towel between your skin and the bag.  Leave the ice on for 15-20 minutes per hour while awake for the first 2 days.  Only take over-the-counter or prescription medicines for pain, discomfort, or fever as directed by your caregiver. SEEK IMMEDIATE MEDICAL CARE IF:   Your pain increases, or you are very uncomfortable.  You have a fever.  Your chest pain becomes worse.  You have new, unexplained symptoms.  You have nausea or vomiting.  You feel sweaty or lightheaded.  You have a cough with phlegm (sputum), or you cough up blood. MAKE SURE YOU:   Understand these instructions.  Will watch your condition.  Will get help right away if you are not doing well or get worse. Document Released: 06/10/2005 Document Revised: 09/02/2011 Document Reviewed: 02/04/2011 [facility] Patient Information 2014 [place], [place].

## 2013-06-21 NOTE — Patient Instructions (Addendum)
Decrease metformin to 500mg  q pill twice per day.  Keep a record of your blood pressures and blood sugar readings outside of the office and bring them to the next office visit. Tylenol for pain in chest wall, knees and foot, or if needed - meloxicam up to once per day. Do not take other NSAIDS like ibuprofen when taking this, and stop right away if any abdominal pain.  Recheck to discuss knees, stomach soreness, foot pain, dizziness and chest wall pain in next few weeks. Return to the clinic or go to the nearest emergency room if any of your symptoms worsen or new symptoms occur.  Chest Wall Pain Chest wall pain is pain in or around the bones and muscles of your chest. It may take up to 6 weeks to get better. It may take longer if you must stay physically active in your work and activities.  CAUSES  Chest wall pain may happen on its own. However, it may be caused by:  A viral illness like the flu.  Injury.  Coughing.  Exercise.  Arthritis.  Fibromyalgia.  Shingles. HOME CARE INSTRUCTIONS   Avoid overtiring physical activity. Try not to strain or perform activities that cause pain. This includes any activities using your chest or your abdominal and side muscles, especially if heavy weights are used.  Put ice on the sore area.  Put ice in a plastic bag.  Place a towel between your skin and the bag.  Leave the ice on for 15-20 minutes per hour while awake for the first 2 days.  Only take over-the-counter or prescription medicines for pain, discomfort, or fever as directed by your caregiver. SEEK IMMEDIATE MEDICAL CARE IF:   Your pain increases, or you are very uncomfortable.  You have a fever.  Your chest pain becomes worse.  You have new, unexplained symptoms.  You have nausea or vomiting.  You feel sweaty or lightheaded.  You have a cough with phlegm (sputum), or you cough up blood. MAKE SURE YOU:   Understand these instructions.  Will watch your  condition.  Will get help right away if you are not doing well or get worse. Document Released: 06/10/2005 Document Revised: 09/02/2011 Document Reviewed: 02/04/2011 [facility] Patient Information 2014 [place], [place].

## 2013-06-23 NOTE — Progress Notes (Signed)
Left message for patient to call back regarding scheduling appointment with Dr Neva Seat.

## 2013-06-30 ENCOUNTER — Encounter: Payer: Self-pay | Admitting: *Deleted

## 2013-07-26 ENCOUNTER — Ambulatory Visit (INDEPENDENT_AMBULATORY_CARE_PROVIDER_SITE_OTHER): Payer: BC Managed Care – PPO | Admitting: Family Medicine

## 2013-07-26 ENCOUNTER — Encounter: Payer: Self-pay | Admitting: Family Medicine

## 2013-07-26 ENCOUNTER — Ambulatory Visit: Payer: BC Managed Care – PPO

## 2013-07-26 VITALS — BP 122/86 | HR 62 | Temp 97.9°F | Resp 16 | Ht 72.5 in | Wt 199.6 lb

## 2013-07-26 DIAGNOSIS — M25561 Pain in right knee: Secondary | ICD-10-CM

## 2013-07-26 DIAGNOSIS — R071 Chest pain on breathing: Secondary | ICD-10-CM

## 2013-07-26 DIAGNOSIS — R0789 Other chest pain: Secondary | ICD-10-CM

## 2013-07-26 DIAGNOSIS — I10 Essential (primary) hypertension: Secondary | ICD-10-CM

## 2013-07-26 DIAGNOSIS — M25569 Pain in unspecified knee: Secondary | ICD-10-CM

## 2013-07-26 DIAGNOSIS — G4700 Insomnia, unspecified: Secondary | ICD-10-CM

## 2013-07-26 DIAGNOSIS — R51 Headache: Secondary | ICD-10-CM

## 2013-07-26 DIAGNOSIS — F439 Reaction to severe stress, unspecified: Secondary | ICD-10-CM

## 2013-07-26 DIAGNOSIS — M25562 Pain in left knee: Secondary | ICD-10-CM

## 2013-07-26 DIAGNOSIS — M79609 Pain in unspecified limb: Secondary | ICD-10-CM

## 2013-07-26 DIAGNOSIS — M79672 Pain in left foot: Secondary | ICD-10-CM

## 2013-07-26 MED ORDER — MELOXICAM 7.5 MG PO TABS: 7.5000 mg | ORAL_TABLET | Freq: Every day | ORAL | Status: DC

## 2013-07-26 MED ORDER — ALPRAZOLAM 0.25 MG PO TABS: 0.2500 mg | ORAL_TABLET | Freq: Every evening | ORAL | Status: DC | PRN

## 2013-07-26 NOTE — Progress Notes (Addendum)
Subjective:    Patient ID: Brendan Gonzales, male    DOB: 27-Jun-1955, 58 y.o.   MRN: [id number]  This chart was scribed for [name], MD by Maree Erie, ED Scribe.  Chief Complaint  Patient presents with  . Headache    X 1 WEEK , ALSO MEDICATION REFILL-HCTZ AND METOPROLOL SUCCINATE  . Knee Pain    BOTH - OFF/ON    PCP: No primary provider on file.  HPI  Brendan Gonzales is a 58 y.o. male who presents to the office. Patient last seen 06/21/13. New patient to me at that time with multiple concerns addressed with plan for follow up to discuss a few of these concerns at each visit.   Headache: He states he has had a throbbing headache that began a week ago. The pain is on both sides of his head and occasionally on the top of his head. He states the headaches have been ongoing every day for about fifteen years. He states that he typically takes Tylenol, [phone] mg for the headaches with relief of the pain. He has taken the medication this week, which relieves the headache before sleep but the pain will reappear upon waking. He denies nausea, vomiting, weakness or numbness in his extremities, blurred vision or slurred speech.   Dizziness: He is still feeling dizzy after taking his medication. The dizziness lasts about two or three hours before gradually subsiding. He denies taking any medication to attempt to relieve the dizziness.   Insomnia/Stress: He states he has constant stress. He works third shift and states he is not getting enough sleep. He has to leave his phone on during the day when he is trying to sleep in case someone calls about his children. He is also unable to sleep well due to racing thoughts. His mother is also sick in [place] which is causing increased stress. He sleeps about four to five hours a day. He states that he is walking during the week for enjoyment. He states it sometimes helps with the stress. He is trying to switch to second shift job, which he believes may  help with the insomnia and stress. He denies smoking or alcohol use. He denies SI or HI.   Diabetes: Blood sugar last night was 189. He states the highest reading was under 200 since last visit and lowest was 135.   Hypertension: Controlled last office visit. Continues metropolol 25 mg QD. Lisinopril 20mg  QD, HCTZ 25 mg QD. Was admitted 12/20-12/21 2014 for chest pain as history of diabetes, troponin negative x3. Nuclear stress test without ischemia and EF of 61%. Suspected GI source for pain as off Protonix at that time. Was restarted on Prilosec QD with resolution of chest pain. He states that his BP at home has been running 149/85. He denies any further abdominal or chest pain. He is taking his Prilosec compliantly. He did have a creatinine of 0.64 and GFR of >90 one month ago.   Knee Pain, bilateral: History of right knee surgery in [place] in 2013 with occasional residual catching in right knee. Then complained of left knee pain for the past year without known injury. Started on Mobic 7.5 mg QD at last office visit for suspected DJD. He states that he is having continued pain when walking up and down stairs, right He is taking the medication compliantly but denies any improvement in pain.    Patient Active Problem List   Diagnosis Date Noted  . Type 2 diabetes mellitus 06/13/2013  .  HTN (hypertension) 06/13/2013  . Normal coronary arteries- 2008 06/13/2013  . Chest pain with moderate risk of acute coronary syndrome 06/12/2013  . Special screening for malignant neoplasms, colon 06/27/2011   Past Medical History  Diagnosis Date  . Hypertension     benign  . Diabetes mellitus     type 2  . Arthritis   . Headache(784.0)   . Hyperlipidemia   . Constipation   . Dizziness    Past Surgical History  Procedure Laterality Date  . Colonoscopy    . Right femeroral bypass    . Right knee surgery    . Coronary angioplasty    . Hemorrhoid surgery     No Known Allergies Prior to  Admission medications   Medication Sig Start Date End Date Taking? Authorizing Provider  aspirin 81 MG tablet Take 81 mg by mouth every morning.     Historical Provider, MD  GARLIC PO Take 1 capsule by mouth every morning.    Historical Provider, MD  hydrochlorothiazide (HYDRODIURIL) 25 MG tablet Take 1 tablet (25 mg total) by mouth every morning. 06/21/13   [name], MD  ibuprofen (ADVIL,MOTRIN) 200 MG tablet Take 400-800 mg by mouth every 8 (eight) hours as needed for moderate pain.    Historical Provider, MD  lisinopril (PRINIVIL,ZESTRIL) 20 MG tablet Take 1 tablet (20 mg total) by mouth every morning. 06/21/13   [name], MD  meloxicam (MOBIC) 7.5 MG tablet Take 1 tablet (7.5 mg total) by mouth daily. 06/21/13   [name], MD  metFORMIN (GLUCOPHAGE) 500 MG tablet Take 1 tablet (500 mg total) by mouth 2 (two) times daily with a meal. 06/21/13   [name], MD  metoprolol succinate (TOPROL-XL) 25 MG 24 hr tablet Take 1 tablet (25 mg total) by mouth every morning. 06/21/13   [name], MD  pantoprazole (PROTONIX) 40 MG tablet Take 1 tablet (40 mg total) by mouth daily. 06/21/13   [name], MD  simvastatin (ZOCOR) 20 MG tablet Take 1 tablet (20 mg total) by mouth every evening. 06/21/13   [name], MD   History   Social History  . Marital Status: Married    Spouse Name: N/A    Number of Children: N/A  . Years of Education: N/A   Occupational History  . Not on file.   Social History Main Topics  . Smoking status: Former Research scientist (life sciences)  . Smokeless tobacco: Not on file  . Alcohol Use: No  . Drug Use: No  . Sexual Activity: Not on file   Other Topics Concern  . Not on file   Social History Narrative  . No narrative on file     Review of Systems  Constitutional: Negative for fatigue and unexpected weight change.  Eyes: Negative for visual disturbance.  Respiratory: Negative for cough, chest tightness and shortness of breath.     Cardiovascular: Negative for chest pain, palpitations and leg swelling.  Gastrointestinal: Negative for abdominal pain and blood in stool.  Musculoskeletal:       Positive for knee pain, bilateral  Neurological: Positive for dizziness and headaches. Negative for light-headedness.       Objective:   Physical Exam  Vitals reviewed. Constitutional: He is oriented to person, place, and time. He appears well-developed and well-nourished.  HENT:  Head: Normocephalic and atraumatic.  Eyes: EOM are normal. Pupils are equal, round, and reactive to light.  No nystagmus.   Neck: No JVD present. Carotid  bruit is not present.  Cardiovascular: Normal rate, regular rhythm and normal heart sounds.   No murmur heard. Pulmonary/Chest: Effort normal and breath sounds normal. He has no rales.  Musculoskeletal: He exhibits tenderness. He exhibits no edema.  Right knee has crepitus as well as diffuse tenderness to the medial lateral joint line and suprapatellar. Full ROM of both knees. Medial and lateral tenderness with both knees. Fair VMO bulk bilaterally.  No erythema, effusion or warmth.   Neurological: He is alert and oriented to person, place, and time.  Negative Rombergs. Negative pronator drift. Normal heel to toe.   Skin: Skin is warm and dry.  Psychiatric: He has a normal mood and affect.     Filed Vitals:   07/26/13 1343  BP: 122/86  Pulse: 62  Temp: 97.9 F (36.6 C)  TempSrc: Oral  Resp: 16  Height: 6' 0.5" (1.842 m)  Weight: 199 lb 9.6 oz (90.538 kg)  SpO2: 100%   [facility] reading (PRIMARY) by Dr. Carlota Raspberry:   R knee: DJD, medial greater than lateral, greater than patellar femoral without acute findings. Cortical irregularity of the fibular shaft, 10 cm from head.   L knee: DJD with decreased joint space, medial greater than lateral, without acute findings.      Assessment & Plan:   Cristin Szatkowski is a 58 y.o. male  Knee pain, bilateral - suspect DJD. Minimal relief with Mobic  7.5mg . Prior hx of surgery. Suspected DJD, with nonspecific area of pain. Can try 1-2 mobic with monitoring of BP and refer to Orthopedic Surgery locally.   Headache(784.0) - chroinc, longstanding by hx. nonfocal exam, and suspect stress/anxiety sx's along with decreased sleep with shift work contribution. Trial of xanax qhs prn, and plans on looking at other shift option. recehck in next 2 months, but if not improving sooner, can refer to neuro.  Situational stress - Plan: ALPRAZolam (XANAX) 0.25 MG tablet - as above, and coping/relaxation techniques discussed.   HTN (hypertension) - levels controlled in office. Check outside BP's and monitor on mobic - cautioned on elevation.    Meds ordered this encounter  Medications  . Cyanocobalamin (VITAMIN B 12 PO)    Sig: Take 1,000 mg by mouth daily.  Marland Kitchen acetaminophen (TYLENOL) 500 MG tablet    Sig: Take 500 mg by mouth every 6 (six) hours as needed.  . Multiple Vitamins-Minerals (MENS 50+ MULTI VITAMIN/MIN PO)    Sig: Take by mouth daily.  Marland Kitchen OVER THE COUNTER MEDICATION    Sig:   . hydrocortisone 1 % ointment    Sig: Apply 1 application topically 2 (two) times daily.  Marland Kitchen ALPRAZolam (XANAX) 0.25 MG tablet    Sig: Take 1 tablet (0.25 mg total) by mouth at bedtime as needed for anxiety or sleep.    Dispense:  20 tablet    Refill:  0  . meloxicam (MOBIC) 7.5 MG tablet    Sig: Take 1-2 tablets (7.5-15 mg total) by mouth daily.    Dispense:  30 tablet    Refill:  0   Patient Instructions  You can take one to two of the Meloxicam per day for knee pain but watch blood pressure, as this medication can elevate it. Your xrays suggest arthritis as a likely cause of the pain. Straight leg raises while seated as discussed, and walking ok for exercise. We will refer you to a new orthopaedist in [place].   Tylenol is ok for the headache but no more than 3000 mg per day.  If the headaches are not improving with getting more sleep and stress management as we  discussed, I can refer you to a neurologist. Ok to take the Xanax at bedtime if needed. Return to the clinic or go to the nearest emergency room if any of your symptoms worsen or new symptoms occur.   Knee Pain Knee pain can be a result of an injury or other medical conditions. Treatment will depend on the cause of your pain. HOME CARE  Only take medicine as told by your doctor.  Keep a healthy weight. Being overweight can make the knee hurt more.  Stretch before exercising or playing sports.  If there is constant knee pain, change the way you exercise. Ask your doctor for advice.  Make sure shoes fit well. Choose the right shoe for the sport or activity.  Protect your knees. Wear kneepads if needed.  Rest when you are tired. GET HELP RIGHT AWAY IF:   Your knee pain does not stop.  Your knee pain does not get better.  Your knee joint feels hot to the touch.  You have a fever. MAKE SURE YOU:   Understand these instructions.  Will watch this condition.  Will get help right away if you are not doing well or get worse. Document Released: 09/06/2008 Document Revised: 09/02/2011 Document Reviewed: 09/06/2008 [facility] Patient Information 2014 [place], [place].       I personally performed the services described in this documentation, which was scribed in my presence. The recorded information has been reviewed and considered, and addended by me as needed.

## 2013-07-26 NOTE — Patient Instructions (Addendum)
You can take one to two of the Meloxicam per day for knee pain but watch blood pressure, as this medication can elevate it. Your xrays suggest arthritis as a likely cause of the pain. Straight leg raises while seated as discussed, and walking ok for exercise. We will refer you to a new orthopaedist in [place].   Tylenol is ok for the headache but no more than 3000 mg per day. If the headaches are not improving with getting more sleep and stress management as we discussed, I can refer you to a neurologist. Ok to take the Xanax at bedtime if needed. Return to the clinic or go to the nearest emergency room if any of your symptoms worsen or new symptoms occur.   Knee Pain Knee pain can be a result of an injury or other medical conditions. Treatment will depend on the cause of your pain. HOME CARE  Only take medicine as told by your doctor.  Keep a healthy weight. Being overweight can make the knee hurt more.  Stretch before exercising or playing sports.  If there is constant knee pain, change the way you exercise. Ask your doctor for advice.  Make sure shoes fit well. Choose the right shoe for the sport or activity.  Protect your knees. Wear kneepads if needed.  Rest when you are tired. GET HELP RIGHT AWAY IF:   Your knee pain does not stop.  Your knee pain does not get better.  Your knee joint feels hot to the touch.  You have a fever. MAKE SURE YOU:   Understand these instructions.  Will watch this condition.  Will get help right away if you are not doing well or get worse. Document Released: 09/06/2008 Document Revised: 09/02/2011 Document Reviewed: 09/06/2008 [facility] Patient Information 2014 [place], [place].

## 2013-08-09 ENCOUNTER — Telehealth: Payer: Self-pay

## 2013-08-09 NOTE — Telephone Encounter (Signed)
Pt is calling to let dr Carlota Raspberry know he will be leaving the country (going to [place]) for 2 months. Pt is requesting a 2 month refill in his meds

## 2013-09-27 ENCOUNTER — Ambulatory Visit: Payer: BC Managed Care – PPO | Admitting: Family Medicine

## 2013-10-18 ENCOUNTER — Encounter: Payer: Self-pay | Admitting: Family Medicine

## 2013-10-18 ENCOUNTER — Ambulatory Visit (INDEPENDENT_AMBULATORY_CARE_PROVIDER_SITE_OTHER): Payer: BC Managed Care – PPO | Admitting: Family Medicine

## 2013-10-18 VITALS — BP 140/80 | HR 92 | Temp 99.3°F | Resp 16 | Ht 73.25 in | Wt 211.0 lb

## 2013-10-18 DIAGNOSIS — M1711 Unilateral primary osteoarthritis, right knee: Secondary | ICD-10-CM

## 2013-10-18 DIAGNOSIS — R21 Rash and other nonspecific skin eruption: Secondary | ICD-10-CM

## 2013-10-18 DIAGNOSIS — M25569 Pain in unspecified knee: Secondary | ICD-10-CM

## 2013-10-18 DIAGNOSIS — IMO0002 Reserved for concepts with insufficient information to code with codable children: Secondary | ICD-10-CM

## 2013-10-18 DIAGNOSIS — R519 Headache, unspecified: Secondary | ICD-10-CM

## 2013-10-18 DIAGNOSIS — F439 Reaction to severe stress, unspecified: Secondary | ICD-10-CM

## 2013-10-18 DIAGNOSIS — E119 Type 2 diabetes mellitus without complications: Secondary | ICD-10-CM

## 2013-10-18 DIAGNOSIS — R51 Headache: Secondary | ICD-10-CM

## 2013-10-18 DIAGNOSIS — M1710 Unilateral primary osteoarthritis, unspecified knee: Secondary | ICD-10-CM

## 2013-10-18 DIAGNOSIS — G4700 Insomnia, unspecified: Secondary | ICD-10-CM

## 2013-10-18 DIAGNOSIS — I10 Essential (primary) hypertension: Secondary | ICD-10-CM

## 2013-10-18 LAB — GLUCOSE, POCT (MANUAL RESULT ENTRY): POC Glucose: 260 mg/dl — AB (ref 70–99)

## 2013-10-18 LAB — POCT GLYCOSYLATED HEMOGLOBIN (HGB A1C): Hemoglobin A1C: 7.2

## 2013-10-18 MED ORDER — HYDROCHLOROTHIAZIDE 25 MG PO TABS: 25.0000 mg | ORAL_TABLET | Freq: Every morning | ORAL | Status: DC

## 2013-10-18 MED ORDER — ALPRAZOLAM 0.25 MG PO TABS: 0.2500 mg | ORAL_TABLET | Freq: Every evening | ORAL | Status: DC | PRN

## 2013-10-18 MED ORDER — TRAMADOL HCL 50 MG PO TABS: 50.0000 mg | ORAL_TABLET | Freq: Four times a day (QID) | ORAL | Status: DC | PRN

## 2013-10-18 MED ORDER — TRIAMCINOLONE ACETONIDE 0.1 % EX CREA: 1.0000 "application " | TOPICAL_CREAM | Freq: Two times a day (BID) | CUTANEOUS | Status: DC

## 2013-10-18 NOTE — Patient Instructions (Addendum)
We will refer you to a neurologist for your headaches. I think some of these are due to not enough sleep and your other stressors.  continue to work on stress management with relaxation techniques, xanax at night if needed, and let me know if you would be willing to talk to a counselor about this stress and ways to help. Return to the clinic or go to the nearest emergency room if any of your symptoms worsen or new symptoms occur.  Keep a record of your blood pressures outside of the office and bring them to the next office visit. If running over 140/90 - may need to increase your medicines or stop the meloxicam.  Follow up with orthopaedic doctor as discussed. Continue tylenol and mobic if needed, and if stronger medicine needed - I wrote you a new stronger pain medicine.  Do not drive on this medicine and it may cause sedation. Return to the clinic if symptoms worsen prior to orthopaedic appointment.   Keep a record of your blood sugars outside of the office and bring them to the next office visit.  Steroid cream and vaseline twice per day to itching area.  If this does not help in next few weeks - return and we can look at other possibilities.   Return to the clinic or go to the nearest emergency room if any of your symptoms worsen or new symptoms occur.\    Osteoarthritis Osteoarthritis is a disease that causes soreness and swelling (inflammation) of a joint. It occurs when the cartilage at the affected joint wears down. Cartilage acts as a cushion, covering the ends of bones where they meet to form a joint. Osteoarthritis is the most common form of arthritis. It often occurs in older people. The joints affected most often by this condition include those in the:  Ends of the fingers.  Thumbs.  Neck.  Lower back.  Knees.  Hips. CAUSES  Over time, the cartilage that covers the ends of bones begins to wear away. This causes bone to rub on bone, producing pain and stiffness in the  affected joints.  RISK FACTORS Certain factors can increase your chances of having osteoarthritis, including:  Older age.  Excessive body weight.  Overuse of joints. SIGNS AND SYMPTOMS   Pain, swelling, and stiffness in the joint.  Over time, the joint may lose its normal shape.  Small deposits of bone (osteophytes) may grow on the edges of the joint.  Bits of bone or cartilage can break off and float inside the joint space. This may cause more pain and damage. DIAGNOSIS  Your health care provider will do a physical exam and ask about your symptoms. Various tests may be ordered, such as:  X-rays of the affected joint.  An MRI scan.  Blood tests to rule out other types of arthritis.  Joint fluid tests. This involves using a needle to draw fluid from the joint and examining the fluid under a microscope. TREATMENT  Goals of treatment are to control pain and improve joint function. Treatment plans may include:  A prescribed exercise program that allows for rest and joint relief.  A weight control plan.  Pain relief techniques, such as:  Properly applied heat and cold.  Electric pulses delivered to nerve endings under the skin (transcutaneous electrical nerve stimulation, TENS).  Massage.  Certain nutritional supplements.  Medicines to control pain, such as:  Acetaminophen.  Nonsteroidal anti-inflammatory drugs (NSAIDs), such as naproxen.  Narcotic or central-acting agents, such as tramadol.  Corticosteroids. These can be given orally or as an injection.  Surgery to reposition the bones and relieve pain (osteotomy) or to remove loose pieces of bone and cartilage. Joint replacement may be needed in advanced states of osteoarthritis. HOME CARE INSTRUCTIONS   Only take over-the-counter or prescription medicines as directed by your health care provider. Take all medicines exactly as instructed.  Maintain a healthy weight. Follow your health care provider's  instructions for weight control. This may include dietary instructions.  Exercise as directed. Your health care provider can recommend specific types of exercise. These may include:  Strengthening exercises These are done to strengthen the muscles that support joints affected by arthritis. They can be performed with weights or with exercise bands to add resistance.  Aerobic activities These are exercises, such as brisk walking or low-impact aerobics, that get your heart pumping.  Range-of-motion activities These keep your joints limber.  Balance and agility exercises These help you maintain daily living skills.  Rest your affected joints as directed by your health care provider.  Follow up with your health care provider as directed. SEEK MEDICAL CARE IF:   Your skin turns red.  You develop a rash in addition to your joint pain.  You have worsening joint pain. SEEK IMMEDIATE MEDICAL CARE IF:  You have a significant loss of weight or appetite.  You have a fever along with joint or muscle aches.  You have night sweats. [place] of Arthritis and Musculoskeletal and Skin Diseases: www.niams.SouthExposed.es [employer] on Aging: http://kim-miller.com/ American College of Rheumatology: www.rheumatology.org Document Released: 06/10/2005 Document Revised: 03/31/2013 Document Reviewed: 02/15/2013 [facility] Patient Information 2014 [place], [place].     Headaches, Frequently Asked Questions MIGRAINE HEADACHES Q: What is migraine? What causes it? How can I treat it? A: Generally, migraine headaches begin as a dull ache. Then they develop into a constant, throbbing, and pulsating pain. You may experience pain at the temples. You may experience pain at the front or back of one or both sides of the head. The pain is usually accompanied by a combination of:  Nausea.  Vomiting.  Sensitivity to light and noise. Some people (about 15%) experience an aura (see  below) before an attack. The cause of migraine is believed to be chemical reactions in the brain. Treatment for migraine may include over-the-counter or prescription medications. It may also include self-help techniques. These include relaxation training and biofeedback.  Q: What is an aura? A: About 15% of people with migraine get an "aura". This is a sign of neurological symptoms that occur before a migraine headache. You may see wavy or jagged lines, dots, or flashing lights. You might experience tunnel vision or blind spots in one or both eyes. The aura can include visual or auditory hallucinations (something imagined). It may include disruptions in smell (such as strange odors), taste or touch. Other symptoms include:  Numbness.  A "pins and needles" sensation.  Difficulty in recalling or speaking the correct word. These neurological events may last as long as 60 minutes. These symptoms will fade as the headache begins. Q: What is a trigger? A: Certain physical or environmental factors can lead to or "trigger" a migraine. These include:  Foods.  Hormonal changes.  Weather.  Stress. It is important to remember that triggers are different for everyone. To help prevent migraine attacks, you need to figure out which triggers affect you. Keep a headache diary. This is a good way to track triggers. The diary will  help you talk to your healthcare professional about your condition. Q: Does weather affect migraines? A: Bright sunshine, hot, humid conditions, and drastic changes in barometric pressure may lead to, or "trigger," a migraine attack in some people. But studies have shown that weather does not act as a trigger for everyone with migraines. Q: What is the link between migraine and hormones? A: Hormones start and regulate many of your body's functions. Hormones keep your body in balance within a constantly changing environment. The levels of hormones in your body are unbalanced at times.  Examples are during menstruation, pregnancy, or menopause. That can lead to a migraine attack. In fact, about three quarters of all women with migraine report that their attacks are related to the menstrual cycle.  Q: Is there an increased risk of stroke for migraine sufferers? A: The likelihood of a migraine attack causing a stroke is very remote. That is not to say that migraine sufferers cannot have a stroke associated with their migraines. In persons under age 10, the most common associated factor for stroke is migraine headache. But over the course of a person's normal life span, the occurrence of migraine headache may actually be associated with a reduced risk of dying from cerebrovascular disease due to stroke.  Q: What are acute medications for migraine? A: Acute medications are used to treat the pain of the headache after it has started. Examples over-the-counter medications, NSAIDs, ergots, and triptans.  Q: What are the triptans? A: Triptans are the newest class of abortive medications. They are specifically targeted to treat migraine. Triptans are vasoconstrictors. They moderate some chemical reactions in the brain. The triptans work on receptors in your brain. Triptans help to restore the balance of a neurotransmitter called serotonin. Fluctuations in levels of serotonin are thought to be a main cause of migraine.  Q: Are over-the-counter medications for migraine effective? A: Over-the-counter, or "OTC," medications may be effective in relieving mild to moderate pain and associated symptoms of migraine. But you should see your caregiver before beginning any treatment regimen for migraine.  Q: What are preventive medications for migraine? A: Preventive medications for migraine are sometimes referred to as "prophylactic" treatments. They are used to reduce the frequency, severity, and length of migraine attacks. Examples of preventive medications include antiepileptic medications,  antidepressants, beta-blockers, calcium channel blockers, and NSAIDs (nonsteroidal anti-inflammatory drugs). Q: Why are anticonvulsants used to treat migraine? A: During the past few years, there has been an increased interest in antiepileptic drugs for the prevention of migraine. They are sometimes referred to as "anticonvulsants". Both epilepsy and migraine may be caused by similar reactions in the brain.  Q: Why are antidepressants used to treat migraine? A: Antidepressants are typically used to treat people with depression. They may reduce migraine frequency by regulating chemical levels, such as serotonin, in the brain.  Q: What alternative therapies are used to treat migraine? A: The term "alternative therapies" is often used to describe treatments considered outside the scope of conventional Western medicine. Examples of alternative therapy include acupuncture, acupressure, and yoga. Another common alternative treatment is herbal therapy. Some herbs are believed to relieve headache pain. Always discuss alternative therapies with your caregiver before proceeding. Some herbal products contain arsenic and other toxins. TENSION HEADACHES Q: What is a tension-type headache? What causes it? How can I treat it? A: Tension-type headaches occur randomly. They are often the result of temporary stress, anxiety, fatigue, or anger. Symptoms include soreness in your temples, a tightening band-like sensation  around your head (a "vice-like" ache). Symptoms can also include a pulling feeling, pressure sensations, and contracting head and neck muscles. The headache begins in your forehead, temples, or the back of your head and neck. Treatment for tension-type headache may include over-the-counter or prescription medications. Treatment may also include self-help techniques such as relaxation training and biofeedback. CLUSTER HEADACHES Q: What is a cluster headache? What causes it? How can I treat it? A: Cluster  headache gets its name because the attacks come in groups. The pain arrives with little, if any, warning. It is usually on one side of the head. A tearing or bloodshot eye and a runny nose on the same side of the headache may also accompany the pain. Cluster headaches are believed to be caused by chemical reactions in the brain. They have been described as the most severe and intense of any headache type. Treatment for cluster headache includes prescription medication and oxygen. SINUS HEADACHES Q: What is a sinus headache? What causes it? How can I treat it? A: When a cavity in the bones of the face and skull (a sinus) becomes inflamed, the inflammation will cause localized pain. This condition is usually the result of an allergic reaction, a tumor, or an infection. If your headache is caused by a sinus blockage, such as an infection, you will probably have a fever. An x-ray will confirm a sinus blockage. Your caregiver's treatment might include antibiotics for the infection, as well as antihistamines or decongestants.  REBOUND HEADACHES Q: What is a rebound headache? What causes it? How can I treat it? A: A pattern of taking acute headache medications too often can lead to a condition known as "rebound headache." A pattern of taking too much headache medication includes taking it more than 2 days per week or in excessive amounts. That means more than the label or a caregiver advises. With rebound headaches, your medications not only stop relieving pain, they actually begin to cause headaches. Doctors treat rebound headache by tapering the medication that is being overused. Sometimes your caregiver will gradually substitute a different type of treatment or medication. Stopping may be a challenge. Regularly overusing a medication increases the potential for serious side effects. Consult a caregiver if you regularly use headache medications more than 2 days per week or more than the label advises. ADDITIONAL  QUESTIONS AND ANSWERS Q: What is biofeedback? A: Biofeedback is a self-help treatment. Biofeedback uses special equipment to monitor your body's involuntary physical responses. Biofeedback monitors:  Breathing.  Pulse.  Heart rate.  Temperature.  Muscle tension.  Brain activity. Biofeedback helps you refine and perfect your relaxation exercises. You learn to control the physical responses that are related to stress. Once the technique has been mastered, you do not need the equipment any more. Q: Are headaches hereditary? A: Four out of five (80%) of people that suffer report a family history of migraine. Scientists are not sure if this is genetic or a family predisposition. Despite the uncertainty, a child has a 50% chance of having migraine if one parent suffers. The child has a 75% chance if both parents suffer.  Q: Can children get headaches? A: By the time they reach high school, most young people have experienced some type of headache. Many safe and effective approaches or medications can prevent a headache from occurring or stop it after it has begun.  Q: What type of doctor should I see to diagnose and treat my headache? A: Start with your primary caregiver.  Discuss his or her experience and approach to headaches. Discuss methods of classification, diagnosis, and treatment. Your caregiver may decide to recommend you to a headache specialist, depending upon your symptoms or other physical conditions. Having diabetes, allergies, etc., may require a more comprehensive and inclusive approach to your headache. The National Headache Foundation will provide, upon request, a list of [facility] physician members in your state. Document Released: 08/31/2003 Document Revised: 09/02/2011 Document Reviewed: 02/08/2008 [facility] Patient Information 2014 [place].

## 2013-10-18 NOTE — Progress Notes (Addendum)
Subjective:  This chart was scribed for [name], MD by Roxan Diesel, Scribe.  This patient was seen in [facility] and the patient's care was started at 1:23 PM.    Patient ID: Brendan Gonzales, male    DOB: 03/01/1956, 58 y.o.   MRN: [id number]  HPI  Brendan Gonzales is a 58 y.o. male PCP: No primary provider on file.   Pt presents for multiple concerns including medication refill, headaches, right knee pain, and a "spot" on his right palm.  1. Hypertension:  Last seen 07/26/13.  Controlled in office, no changes in medications.  He needs a refill of his hydrochlorothiazide.  He has been checking his BP at home and ranging from 145/80-85.  He denies missed doses.  He denies side effects.  Last BMP in December within normal limits, except glucose 110.  GFR was greater than 90.  2. Anxiety: Decreased sleep due to working 3rd shift at last office visit.  Racing thoughts at nights, and mother was sick in [place], causing some stress.  Plan on possible 2nd shift and help with insomnia and stress, as only sleeping 4-5 hours per day at last visit.  Prescribed Xanax 0.25mg  n. 20 at that office visit, as well as coping and relaxation techniques.  He needs a refill of Xanax.  Pt has stopped working since has last visit, due to his knee pain.  However he states his stress level is about the same as at his last visit.  He has continued to have insomnia and is sleeping about 5 hours per day.  He is taking the Xanax every 3-4 days, only at night to help him sleep.  He states it does help him sleep when he takes it.  Pt is Muslim and has to wake up to pray at 5 AM, and reports he goes to bed at around 11 PM.  He states it would not be quiet if he went to bed.  In regard to stress relief techniques he states he has been walking a lot but has not been using other techniques.  He states he continues to have a lot on his mind, including his mother in [place].  He declines to meet with a counselor at this time.  3.  Headaches: Also discussed at last office visit.  Bitemporal at that time.  Had been having headaches for about 15 years and taking Tylenol as needed.  Suspected stress and anxiety components, with non-focal neuro exam last office visit.  States he continues to have daily headaches that are unchanged since his last visit.  Pain is improved when he takes Mobic for his knee pain.  He denies nausea, vomiting, blurred vision, focal weakness, or slurred speech.  4. Bilateral knee pain: Also discussed at last office visit.  H/o surgery on right knee in 2013.  Used Mobic as needed.  Suspected DJD and planned referral to orthopedics locally, as h/o surgery and preferred to be seen here instead of [place].  X-rays obtained at last office visit - right knee had tricompartmental osteoarthritis, lateral tracking, and small osteochondroma on proximal fibula.  Left knee had moderate medial OA.  Pt has not followed up with an orthopedist since then.  He states his pain has worsened.  Pain worsened by bearing weight.  He walks with a cane.  He is taking Mobic 1x/day.  He has never been on narcotic pain medications.  He denies h/o seizures.  5. Spot on right hand: He reports dark, dry  itchy skin at the base of his right 3rd and 4th fingers that has been present for a few years.  He has been using Vaseline with some relief.  6. Diabetes type 2: Taking metformin 500mg  2x/day.  He is not taking any other diabetes medications.  Last A1C was 6.1 on 06/21/2013.  He has been checking his sugar at home and it has been generally around 145.  He denies any drops in blood sugar.    Patient Active Problem List   Diagnosis Date Noted  . Type 2 diabetes mellitus 06/13/2013  . HTN (hypertension) 06/13/2013  . Normal coronary arteries- 2008 06/13/2013  . Chest pain with moderate risk of acute coronary syndrome 06/12/2013  . Special screening for malignant neoplasms, colon 06/27/2011    Past Medical History  Diagnosis Date    . Hypertension     benign  . Diabetes mellitus     type 2  . Arthritis   . Headache(784.0)   . Hyperlipidemia   . Constipation   . Dizziness     Past Surgical History  Procedure Laterality Date  . Colonoscopy    . Right femeroral bypass    . Right knee surgery    . Coronary angioplasty    . Hemorrhoid surgery      No Known Allergies  Prior to Admission medications   Medication Sig Start Date End Date Taking? Authorizing Provider  acetaminophen (TYLENOL) 500 MG tablet Take 500 mg by mouth every 6 (six) hours as needed.   Yes Historical Provider, MD  ALPRAZolam (XANAX) 0.25 MG tablet Take 1 tablet (0.25 mg total) by mouth at bedtime as needed for anxiety or sleep. 07/26/13  Yes [name], MD  aspirin 81 MG tablet Take 81 mg by mouth every morning.    Yes Historical Provider, MD  Cyanocobalamin (VITAMIN B 12 PO) Take 1,000 mg by mouth daily.   Yes Historical Provider, MD  GARLIC PO Take 1 capsule by mouth every morning.   Yes Historical Provider, MD  hydrochlorothiazide (HYDRODIURIL) 25 MG tablet Take 1 tablet (25 mg total) by mouth every morning. 06/21/13  Yes [name], MD  hydrocortisone 1 % ointment Apply 1 application topically 2 (two) times daily.   Yes Historical Provider, MD  ibuprofen (ADVIL,MOTRIN) 200 MG tablet Take 400-800 mg by mouth every 8 (eight) hours as needed for moderate pain.   Yes Historical Provider, MD  lisinopril (PRINIVIL,ZESTRIL) 20 MG tablet Take 1 tablet (20 mg total) by mouth every morning. 06/21/13  Yes [name], MD  meloxicam (MOBIC) 7.5 MG tablet Take 1-2 tablets (7.5-15 mg total) by mouth daily. 07/26/13  Yes [name], MD  metFORMIN (GLUCOPHAGE) 500 MG tablet Take 1 tablet (500 mg total) by mouth 2 (two) times daily with a meal. 06/21/13  Yes [name], MD  metoprolol succinate (TOPROL-XL) 25 MG 24 hr tablet Take 1 tablet (25 mg total) by mouth every morning. 06/21/13  Yes [name], MD  Multiple  Vitamins-Minerals (MENS 50+ MULTI VITAMIN/MIN PO) Take by mouth daily.   Yes Historical Provider, MD  OVER THE COUNTER MEDICATION    Yes Historical Provider, MD  pantoprazole (PROTONIX) 40 MG tablet Take 1 tablet (40 mg total) by mouth daily. 06/21/13  Yes [name], MD  simvastatin (ZOCOR) 20 MG tablet Take 1 tablet (20 mg total) by mouth every evening. 06/21/13  Yes [name], MD    History   Social History  .  Marital Status: Married    Spouse Name: N/A    Number of Children: N/A  . Years of Education: N/A   Occupational History  . Not on file.   Social History Main Topics  . Smoking status: Former Research scientist (life sciences)  . Smokeless tobacco: Not on file  . Alcohol Use: No  . Drug Use: No  . Sexual Activity: Not on file   Other Topics Concern  . Not on file   Social History Narrative  . No narrative on file      Review of Systems  Constitutional: Positive for fatigue. Negative for unexpected weight change.  Eyes: Negative for visual disturbance.  Respiratory: Negative for cough, chest tightness and shortness of breath.   Cardiovascular: Negative for chest pain, palpitations and leg swelling.  Gastrointestinal: Negative for abdominal pain and blood in stool.  Musculoskeletal: Positive for arthralgias.  Skin: Positive for rash.  Neurological: Positive for headaches. Negative for dizziness, speech difficulty, weakness and light-headedness.  Psychiatric/Behavioral: Positive for sleep disturbance. The patient is nervous/anxious.         Objective:   Physical Exam  Vitals reviewed. Constitutional: He is oriented to person, place, and time. He appears well-developed and well-nourished.  HENT:  Head: Normocephalic and atraumatic.  Eyes: EOM are normal. Pupils are equal, round, and reactive to light.  Neck: No JVD present. Carotid bruit is not present.  Cardiovascular: Normal rate, regular rhythm and normal heart sounds.   No murmur heard. Pulmonary/Chest: Effort  normal and breath sounds normal. He has no rales.  Musculoskeletal: He exhibits no edema.  Right knee exam: Minimal decreased extension, guarded flexion to about 90 degrees, tender along both joint lines, no effusion.  Neurological: He is alert and oriented to person, place, and time.  Nonfocal neuro exam.  No pronator drift.  Negative romberg.   Skin: Skin is warm and dry.  On right hand he has hyperpigmentation in the middle of the palm.  Dry cracked skin at the base of the 3rd and 4th fingers.  No erythema, no discharge.  No other rash.  Psychiatric: He has a normal mood and affect.     Filed Vitals:   10/18/13 1256  BP: 140/80  Pulse: 92  Temp: 99.3 F (37.4 C)  TempSrc: Oral  Resp: 16  Height: 6' 1.25" (1.861 m)  Weight: 211 lb (95.709 kg)  SpO2: 97%   Results for orders placed in visit on 10/18/13  GLUCOSE, POCT (MANUAL RESULT ENTRY)      Result Value Ref Range   POC Glucose 260 (*) 70 - 99 mg/dl  POCT GLYCOSYLATED HEMOGLOBIN (HGB A1C)      Result Value Ref Range   Hemoglobin A1C 7.2          Assessment & Plan:    Brendan Gonzales is a 58 y.o. male Situational stress, Insomnia - Plan: ALPRAZolam (XANAX) 0.25 MG tablet - multiple social stressors, offered counseling - declined at present. Recommended to go to bed at earlier hour as wakes up at 5 am and to work on coping techniques. Xanax if needed for now - refilled. May need to look into disability if unable to work d/t knee pain - can discuss with ortho.   HTN (hypertension) - Plan: hydrochlorothiazide (HYDRODIURIL) 25 MG tablet - borderline control, but no changes needed at this time. Check home BP's.   DM type 2 (diabetes mellitus, type 2) - Plan: POCT glucose (manual entry) - borderline A1c.  No change for now, watch diet and  recheck A1c in 3 months.   Recurrent knee pain, Osteoarthritis of right knee - tricompartmental OA. May need surgery again vs trial of injection or viscosupplementation.  Delay in ortho eval was  due to not having records from prior ortho. Requested from our office today to help with this. Cont mobic, tylenol, or if needed for more severe pain - Ultram RX. SED. rtc precautions.   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 Trail of tac 0.1%BID, and vaseline topical BID and if not improved in next few weeks - rtc.   Chronic headaches - Plan: Ambulatory referral to Neurology - suspect component of stress and lack of sleep.  nonfocal neuro exam.  Will refer to neuro for eval, but treat anxiety, lack of sleep as above.   Meds ordered this encounter  Medications  . ALPRAZolam (XANAX) 0.25 MG tablet    Sig: Take 1 tablet (0.25 mg total) by mouth at bedtime as needed for anxiety or sleep.    Dispense:  20 tablet    Refill:  0  . hydrochlorothiazide (HYDRODIURIL) 25 MG tablet    Sig: Take 1 tablet (25 mg total) by mouth every morning.    Dispense:  90 tablet    Refill:  1  . triamcinolone cream (KENALOG) 0.1 %    Sig: Apply 1 application topically 2 (two) times daily. To hand rash as needed.    Dispense:  30 g    Refill:  0  . traMADol (ULTRAM) 50 MG tablet    Sig: Take 1 tablet (50 mg total) by mouth every 6 (six) hours as needed for severe pain.    Dispense:  30 tablet    Refill:  0      Patient Instructions  We will refer you to a neurologist for your headaches. I think some of these are due to not enough sleep and your other stressors.  continue to work on stress management with relaxation techniques, xanax at night if needed, and let me know if you would be willing to talk to a counselor about this stress and ways to help. Return to the clinic or go to the nearest emergency room if any of your symptoms worsen or new symptoms occur.  Keep a record of your blood pressures outside of the office and bring them to the next office visit. If running over 140/90 - may need to increase your medicines or stop the meloxicam.  Follow up with  orthopaedic doctor as discussed. Continue tylenol and mobic if needed, and if stronger medicine needed - I wrote you a new stronger pain medicine.  Do not drive on this medicine and it may cause sedation. Return to the clinic if symptoms worsen prior to orthopaedic appointment.   Keep a record of your blood sugars outside of the office and bring them to the next office visit.  Steroid cream and vaseline twice per day to itching area.  If this does not help in next few weeks - return and we can look at other possibilities.   Return to the clinic or go to the nearest emergency room if any of your symptoms worsen or new symptoms occur.\    Osteoarthritis Osteoarthritis is a disease that causes soreness and swelling (inflammation) of a joint. It occurs when the cartilage at the affected joint wears down. Cartilage acts as a cushion, covering the ends of bones where they meet to form a joint. Osteoarthritis is the most common  form of arthritis. It often occurs in older people. The joints affected most often by this condition include those in the:  Ends of the fingers.  Thumbs.  Neck.  Lower back.  Knees.  Hips. CAUSES  Over time, the cartilage that covers the ends of bones begins to wear away. This causes bone to rub on bone, producing pain and stiffness in the affected joints.  RISK FACTORS Certain factors can increase your chances of having osteoarthritis, including:  Older age.  Excessive body weight.  Overuse of joints. SIGNS AND SYMPTOMS   Pain, swelling, and stiffness in the joint.  Over time, the joint may lose its normal shape.  Small deposits of bone (osteophytes) may grow on the edges of the joint.  Bits of bone or cartilage can break off and float inside the joint space. This may cause more pain and damage. DIAGNOSIS  Your health care provider will do a physical exam and ask about your symptoms. Various tests may be ordered, such as:  X-rays of the affected  joint.  An MRI scan.  Blood tests to rule out other types of arthritis.  Joint fluid tests. This involves using a needle to draw fluid from the joint and examining the fluid under a microscope. TREATMENT  Goals of treatment are to control pain and improve joint function. Treatment plans may include:  A prescribed exercise program that allows for rest and joint relief.  A weight control plan.  Pain relief techniques, such as:  Properly applied heat and cold.  Electric pulses delivered to nerve endings under the skin (transcutaneous electrical nerve stimulation, TENS).  Massage.  Certain nutritional supplements.  Medicines to control pain, such as:  Acetaminophen.  Nonsteroidal anti-inflammatory drugs (NSAIDs), such as naproxen.  Narcotic or central-acting agents, such as tramadol.  Corticosteroids. These can be given orally or as an injection.  Surgery to reposition the bones and relieve pain (osteotomy) or to remove loose pieces of bone and cartilage. Joint replacement may be needed in advanced states of osteoarthritis. HOME CARE INSTRUCTIONS   Only take over-the-counter or prescription medicines as directed by your health care provider. Take all medicines exactly as instructed.  Maintain a healthy weight. Follow your health care provider's instructions for weight control. This may include dietary instructions.  Exercise as directed. Your health care provider can recommend specific types of exercise. These may include:  Strengthening exercises These are done to strengthen the muscles that support joints affected by arthritis. They can be performed with weights or with exercise bands to add resistance.  Aerobic activities These are exercises, such as brisk walking or low-impact aerobics, that get your heart pumping.  Range-of-motion activities These keep your joints limber.  Balance and agility exercises These help you maintain daily living skills.  Rest your  affected joints as directed by your health care provider.  Follow up with your health care provider as directed. SEEK MEDICAL CARE IF:   Your skin turns red.  You develop a rash in addition to your joint pain.  You have worsening joint pain. SEEK IMMEDIATE MEDICAL CARE IF:  You have a significant loss of weight or appetite.  You have a fever along with joint or muscle aches.  You have night sweats. [place] of Arthritis and Musculoskeletal and Skin Diseases: www.niams.SouthExposed.es [employer] on Aging: http://kim-miller.com/ American College of Rheumatology: www.rheumatology.org Document Released: 06/10/2005 Document Revised: 03/31/2013 Document Reviewed: 02/15/2013 [facility] Patient Information 2014 [place], [place].     Headaches,  Frequently Asked Questions MIGRAINE HEADACHES Q: What is migraine? What causes it? How can I treat it? A: Generally, migraine headaches begin as a dull ache. Then they develop into a constant, throbbing, and pulsating pain. You may experience pain at the temples. You may experience pain at the front or back of one or both sides of the head. The pain is usually accompanied by a combination of:  Nausea.  Vomiting.  Sensitivity to light and noise. Some people (about 15%) experience an aura (see below) before an attack. The cause of migraine is believed to be chemical reactions in the brain. Treatment for migraine may include over-the-counter or prescription medications. It may also include self-help techniques. These include relaxation training and biofeedback.  Q: What is an aura? A: About 15% of people with migraine get an "aura". This is a sign of neurological symptoms that occur before a migraine headache. You may see wavy or jagged lines, dots, or flashing lights. You might experience tunnel vision or blind spots in one or both eyes. The aura can include visual or auditory hallucinations (something imagined). It may  include disruptions in smell (such as strange odors), taste or touch. Other symptoms include:  Numbness.  A "pins and needles" sensation.  Difficulty in recalling or speaking the correct word. These neurological events may last as long as 60 minutes. These symptoms will fade as the headache begins. Q: What is a trigger? A: Certain physical or environmental factors can lead to or "trigger" a migraine. These include:  Foods.  Hormonal changes.  Weather.  Stress. It is important to remember that triggers are different for everyone. To help prevent migraine attacks, you need to figure out which triggers affect you. Keep a headache diary. This is a good way to track triggers. The diary will help you talk to your healthcare professional about your condition. Q: Does weather affect migraines? A: Bright sunshine, hot, humid conditions, and drastic changes in barometric pressure may lead to, or "trigger," a migraine attack in some people. But studies have shown that weather does not act as a trigger for everyone with migraines. Q: What is the link between migraine and hormones? A: Hormones start and regulate many of your body's functions. Hormones keep your body in balance within a constantly changing environment. The levels of hormones in your body are unbalanced at times. Examples are during menstruation, pregnancy, or menopause. That can lead to a migraine attack. In fact, about three quarters of all women with migraine report that their attacks are related to the menstrual cycle.  Q: Is there an increased risk of stroke for migraine sufferers? A: The likelihood of a migraine attack causing a stroke is very remote. That is not to say that migraine sufferers cannot have a stroke associated with their migraines. In persons under age 44, the most common associated factor for stroke is migraine headache. But over the course of a person's normal life span, the occurrence of migraine headache may  actually be associated with a reduced risk of dying from cerebrovascular disease due to stroke.  Q: What are acute medications for migraine? A: Acute medications are used to treat the pain of the headache after it has started. Examples over-the-counter medications, NSAIDs, ergots, and triptans.  Q: What are the triptans? A: Triptans are the newest class of abortive medications. They are specifically targeted to treat migraine. Triptans are vasoconstrictors. They moderate some chemical reactions in the brain. The triptans work on receptors in your brain. Triptans help  to restore the balance of a neurotransmitter called serotonin. Fluctuations in levels of serotonin are thought to be a main cause of migraine.  Q: Are over-the-counter medications for migraine effective? A: Over-the-counter, or "OTC," medications may be effective in relieving mild to moderate pain and associated symptoms of migraine. But you should see your caregiver before beginning any treatment regimen for migraine.  Q: What are preventive medications for migraine? A: Preventive medications for migraine are sometimes referred to as "prophylactic" treatments. They are used to reduce the frequency, severity, and length of migraine attacks. Examples of preventive medications include antiepileptic medications, antidepressants, beta-blockers, calcium channel blockers, and NSAIDs (nonsteroidal anti-inflammatory drugs). Q: Why are anticonvulsants used to treat migraine? A: During the past few years, there has been an increased interest in antiepileptic drugs for the prevention of migraine. They are sometimes referred to as "anticonvulsants". Both epilepsy and migraine may be caused by similar reactions in the brain.  Q: Why are antidepressants used to treat migraine? A: Antidepressants are typically used to treat people with depression. They may reduce migraine frequency by regulating chemical levels, such as serotonin, in the brain.  Q: What  alternative therapies are used to treat migraine? A: The term "alternative therapies" is often used to describe treatments considered outside the scope of conventional Western medicine. Examples of alternative therapy include acupuncture, acupressure, and yoga. Another common alternative treatment is herbal therapy. Some herbs are believed to relieve headache pain. Always discuss alternative therapies with your caregiver before proceeding. Some herbal products contain arsenic and other toxins. TENSION HEADACHES Q: What is a tension-type headache? What causes it? How can I treat it? A: Tension-type headaches occur randomly. They are often the result of temporary stress, anxiety, fatigue, or anger. Symptoms include soreness in your temples, a tightening band-like sensation around your head (a "vice-like" ache). Symptoms can also include a pulling feeling, pressure sensations, and contracting head and neck muscles. The headache begins in your forehead, temples, or the back of your head and neck. Treatment for tension-type headache may include over-the-counter or prescription medications. Treatment may also include self-help techniques such as relaxation training and biofeedback. CLUSTER HEADACHES Q: What is a cluster headache? What causes it? How can I treat it? A: Cluster headache gets its name because the attacks come in groups. The pain arrives with little, if any, warning. It is usually on one side of the head. A tearing or bloodshot eye and a runny nose on the same side of the headache may also accompany the pain. Cluster headaches are believed to be caused by chemical reactions in the brain. They have been described as the most severe and intense of any headache type. Treatment for cluster headache includes prescription medication and oxygen. SINUS HEADACHES Q: What is a sinus headache? What causes it? How can I treat it? A: When a cavity in the bones of the face and skull (a sinus) becomes inflamed,  the inflammation will cause localized pain. This condition is usually the result of an allergic reaction, a tumor, or an infection. If your headache is caused by a sinus blockage, such as an infection, you will probably have a fever. An x-ray will confirm a sinus blockage. Your caregiver's treatment might include antibiotics for the infection, as well as antihistamines or decongestants.  REBOUND HEADACHES Q: What is a rebound headache? What causes it? How can I treat it? A: A pattern of taking acute headache medications too often can lead to a condition known as "rebound headache."  A pattern of taking too much headache medication includes taking it more than 2 days per week or in excessive amounts. That means more than the label or a caregiver advises. With rebound headaches, your medications not only stop relieving pain, they actually begin to cause headaches. Doctors treat rebound headache by tapering the medication that is being overused. Sometimes your caregiver will gradually substitute a different type of treatment or medication. Stopping may be a challenge. Regularly overusing a medication increases the potential for serious side effects. Consult a caregiver if you regularly use headache medications more than 2 days per week or more than the label advises. ADDITIONAL QUESTIONS AND ANSWERS Q: What is biofeedback? A: Biofeedback is a self-help treatment. Biofeedback uses special equipment to monitor your body's involuntary physical responses. Biofeedback monitors:  Breathing.  Pulse.  Heart rate.  Temperature.  Muscle tension.  Brain activity. Biofeedback helps you refine and perfect your relaxation exercises. You learn to control the physical responses that are related to stress. Once the technique has been mastered, you do not need the equipment any more. Q: Are headaches hereditary? A: Four out of five (80%) of people that suffer report a family history of migraine. Scientists are not  sure if this is genetic or a family predisposition. Despite the uncertainty, a child has a 50% chance of having migraine if one parent suffers. The child has a 75% chance if both parents suffer.  Q: Can children get headaches? A: By the time they reach high school, most young people have experienced some type of headache. Many safe and effective approaches or medications can prevent a headache from occurring or stop it after it has begun.  Q: What type of doctor should I see to diagnose and treat my headache? A: Start with your primary caregiver. Discuss his or her experience and approach to headaches. Discuss methods of classification, diagnosis, and treatment. Your caregiver may decide to recommend you to a headache specialist, depending upon your symptoms or other physical conditions. Having diabetes, allergies, etc., may require a more comprehensive and inclusive approach to your headache. The National Headache Foundation will provide, upon request, a list of [facility] physician members in your state. Document Released: 08/31/2003 Document Revised: 09/02/2011 Document Reviewed: 02/08/2008 [facility] Patient Information 2014 [place].     I personally performed the services described in this documentation, which was scribed in my presence. The recorded information has been reviewed and considered, and addended by me as needed.

## 2013-10-28 ENCOUNTER — Ambulatory Visit: Payer: BC Managed Care – PPO | Admitting: Neurology

## 2013-12-10 ENCOUNTER — Other Ambulatory Visit: Payer: Self-pay | Admitting: Family Medicine

## 2014-01-08 ENCOUNTER — Other Ambulatory Visit: Payer: Self-pay | Admitting: Family Medicine

## 2014-01-24 ENCOUNTER — Ambulatory Visit: Payer: BC Managed Care – PPO | Admitting: Family Medicine

## 2014-02-09 ENCOUNTER — Other Ambulatory Visit: Payer: Self-pay | Admitting: Family Medicine

## 2014-02-09 DIAGNOSIS — K219 Gastro-esophageal reflux disease without esophagitis: Secondary | ICD-10-CM

## 2014-02-09 NOTE — Telephone Encounter (Signed)
Dr Carlota Raspberry, you saw pt in Apr for a check-up but don't see GERD addressed. Can we RF?

## 2014-02-10 NOTE — Telephone Encounter (Signed)
Can refill for 90 days, but needs ov to discuss before that runs out.

## 2014-02-11 NOTE — Telephone Encounter (Signed)
Notified pt of RFs and that he needs OV to discuss GERD bf next RF. Pt agreed.

## 2014-02-25 ENCOUNTER — Other Ambulatory Visit: Payer: Self-pay | Admitting: Physician Assistant

## 2014-03-07 ENCOUNTER — Ambulatory Visit (INDEPENDENT_AMBULATORY_CARE_PROVIDER_SITE_OTHER): Payer: BC Managed Care – PPO | Admitting: Family Medicine

## 2014-03-07 ENCOUNTER — Encounter: Payer: Self-pay | Admitting: Family Medicine

## 2014-03-07 VITALS — BP 126/82 | HR 57 | Temp 98.4°F | Resp 16 | Ht 72.5 in | Wt 202.4 lb

## 2014-03-07 DIAGNOSIS — E119 Type 2 diabetes mellitus without complications: Secondary | ICD-10-CM

## 2014-03-07 DIAGNOSIS — Z23 Encounter for immunization: Secondary | ICD-10-CM

## 2014-03-07 DIAGNOSIS — L602 Onychogryphosis: Secondary | ICD-10-CM

## 2014-03-07 DIAGNOSIS — L608 Other nail disorders: Secondary | ICD-10-CM

## 2014-03-07 DIAGNOSIS — E785 Hyperlipidemia, unspecified: Secondary | ICD-10-CM

## 2014-03-07 DIAGNOSIS — I10 Essential (primary) hypertension: Secondary | ICD-10-CM

## 2014-03-07 LAB — COMPLETE METABOLIC PANEL WITH GFR
ALBUMIN: 4.5 g/dL (ref 3.5–5.2)
ALT: 15 U/L (ref 0–53)
AST: 18 U/L (ref 0–37)
Alkaline Phosphatase: 77 U/L (ref 39–117)
BILIRUBIN TOTAL: 0.4 mg/dL (ref 0.2–1.2)
BUN: 8 mg/dL (ref 6–23)
CO2: 28 mEq/L (ref 19–32)
Calcium: 9.3 mg/dL (ref 8.4–10.5)
Chloride: 100 mEq/L (ref 96–112)
Creat: 0.66 mg/dL (ref 0.50–1.35)
GFR, Est African American: >89 mL/min
Glucose, Bld: 99 mg/dL (ref 70–99)
Potassium: 3.9 mEq/L (ref 3.5–5.3)
SODIUM: 139 meq/L (ref 135–145)
Total Protein: 6.8 g/dL (ref 6.0–8.3)

## 2014-03-07 LAB — LIPID PANEL
Cholesterol: 145 mg/dL (ref 0–200)
HDL: 42 mg/dL (ref >39)
LDL CALC: 85 mg/dL (ref 0–99)
Total CHOL/HDL Ratio: 3.5 Ratio
Triglycerides: 92 mg/dL (ref <150)
VLDL: 18 mg/dL (ref 0–40)

## 2014-03-07 LAB — GLUCOSE, POCT (MANUAL RESULT ENTRY): POC Glucose: 109 mg/dl — AB (ref 70–99)

## 2014-03-07 LAB — POCT GLYCOSYLATED HEMOGLOBIN (HGB A1C): HEMOGLOBIN A1C: 7

## 2014-03-07 MED ORDER — ATORVASTATIN CALCIUM 10 MG PO TABS: 10.0000 mg | ORAL_TABLET | Freq: Every day | ORAL | Status: DC

## 2014-03-07 MED ORDER — METFORMIN HCL 500 MG PO TABS: ORAL_TABLET | ORAL | Status: DC

## 2014-03-07 MED ORDER — HYDROCHLOROTHIAZIDE 25 MG PO TABS: 25.0000 mg | ORAL_TABLET | Freq: Every morning | ORAL | Status: DC

## 2014-03-07 MED ORDER — LISINOPRIL 20 MG PO TABS: ORAL_TABLET | ORAL | Status: DC

## 2014-03-07 NOTE — Progress Notes (Signed)
Subjective:    Patient ID: Brendan Gonzales, male    DOB: Sep 19, 1955, 58 y.o.   MRN: [id number] This chart was scribed for Brendan Agreste, MD by Cathie Hoops, ED Scribe. The patient was seen in Room . The patient's care was started at 5:18 PM.   03/07/2014  Medication Refill   HPI HPI Comments: Brendan Gonzales is a 58 y.o. male who presents to the [facility] for medication refill.  Last seen in April of this year with multiple concerns addressed at this visit.   1.) Hypertension He has had a cardiac echo on December 2014 with Grade 2 dyastolic dysfunction but normal systolic function and wall motion. Mild aortic regurgitation. He was admitted in December of last year for chest pain. He had a nuclear stress test that did not reveal ischemia. Chest pain was thought to be related to GERD. He is on Toprol XL 25 mg once per day and lisinopril 20 mg q.d and HCTZ 25 mg q.d. Pt checks his BP at home 638-177 for diastolic and 89 for systolic. Pt denies chest pain, difficulty breathing, or dizziness. Pt notes slight headaches but they have improved. He states he previously took Lipitor several years ago with no adverse side effects.  2.) Anxiety See details of OV. Suspected situational stress with insomnia. Xanax 0.25 mg was prescribed if needed.  Pt denies taking anxiety. Pt is able to sleep now and notes his stress has calmed.  3.) Type 2 Diabetes Last A1C was 7.2 in April. No med changes at that time. Weight down from 211 to 202 today. He is on Metformin 500 mg twice per day, aspirin 81 mg once per day and takes Zocor 20 mg q.d as a statin. Pt notes his sugar have been fluctuating from 121-160. Pt denies symptomatic lows. Pt is compliant with Metformin. Pt is still taking aspirin and BP medication. Pt notes he feels like his blood sugar is doing well.  Pt is complaining of a thickened right, second toenail.  Review of Systems  Constitutional: Negative for appetite change and  fatigue.  HENT: Negative for congestion, ear discharge and sinus pressure.   Eyes: Negative for discharge.  Respiratory: Negative for cough.   Cardiovascular: Negative for chest pain.  Gastrointestinal: Negative for abdominal pain and diarrhea.  Genitourinary: Negative for frequency and hematuria.  Musculoskeletal: Negative for back pain.  Skin: Negative for rash.  Neurological: Positive for headaches. Negative for seizures.  Psychiatric/Behavioral: Negative for hallucinations.  All other systems reviewed and are negative.  Patient Active Problem List   Diagnosis Date Noted  . Type 2 diabetes mellitus 06/13/2013  . HTN (hypertension) 06/13/2013  . Normal coronary arteries- 2008 06/13/2013  . Chest pain with moderate risk of acute coronary syndrome 06/12/2013  . Special screening for malignant neoplasms, colon 06/27/2011   Past Medical History  Diagnosis Date  . Hypertension     benign  . Diabetes mellitus     type 2  . Arthritis   . Headache(784.0)   . Hyperlipidemia   . Constipation   . Dizziness    Past Surgical History  Procedure Laterality Date  . Colonoscopy    . Right femeroral bypass    . Right knee surgery    . Coronary angioplasty    . Hemorrhoid surgery     No Known Allergies Prior to Admission medications   Medication Sig Start Date End Date Taking? Authorizing Provider  acetaminophen (TYLENOL) 500 MG tablet Take 500  mg by mouth every 6 (six) hours as needed.   Yes Historical Provider, MD  ALPRAZolam (XANAX) 0.25 MG tablet Take 1 tablet (0.25 mg total) by mouth at bedtime as needed for anxiety or sleep. 10/18/13  Yes Brendan Agreste, MD  aspirin 81 MG tablet Take 81 mg by mouth every morning.    Yes Historical Provider, MD  Cyanocobalamin (VITAMIN B 12 PO) Take 1,000 mg by mouth daily.   Yes Historical Provider, MD  GARLIC PO Take 1 capsule by mouth every morning.   Yes Historical Provider, MD  hydrochlorothiazide (HYDRODIURIL) 25 MG tablet Take 1 tablet  (25 mg total) by mouth every morning. 10/18/13  Yes Brendan Agreste, MD  hydrochlorothiazide (HYDRODIURIL) 25 MG tablet Take 1 tablet (25 mg total) by mouth daily. PATIENT NEEDS OFFICE VISIT FOR ADDITIONAL REFILLS 02/28/14  Yes Chelle S Jeffery, PA-C  hydrocortisone 1 % ointment Apply 1 application topically 2 (two) times daily.   Yes Historical Provider, MD  lisinopril (PRINIVIL,ZESTRIL) 20 MG tablet TAKE ONE TABLET BY MOUTH IN THE MORNING   Yes Brendan Agreste, MD  meloxicam (MOBIC) 7.5 MG tablet Take 1-2 tablets (7.5-15 mg total) by mouth daily. 07/26/13  Yes Brendan Agreste, MD  metFORMIN (GLUCOPHAGE) 500 MG tablet TAKE ONE TABLET BY MOUTH TWICE DAILY WITH MEALS 02/09/14  Yes Brendan Agreste, MD  metoprolol succinate (TOPROL-XL) 25 MG 24 hr tablet Take 1 tablet (25 mg total) by mouth every morning. 06/21/13  Yes Brendan Agreste, MD  Multiple Vitamins-Minerals (MENS 50+ MULTI VITAMIN/MIN PO) Take by mouth daily.   Yes Historical Provider, MD  OVER THE COUNTER MEDICATION    Yes Historical Provider, MD  pantoprazole (PROTONIX) 40 MG tablet TAKE ONE TABLET BY MOUTH ONCE DAILY 02/10/14  Yes Brendan Agreste, MD  simvastatin (ZOCOR) 20 MG tablet TAKE ONE TABLET BY MOUTH IN THE EVENING   Yes Brendan Agreste, MD  traMADol (ULTRAM) 50 MG tablet Take 1 tablet (50 mg total) by mouth every 6 (six) hours as needed for severe pain. 10/18/13  Yes Brendan Agreste, MD  triamcinolone cream (KENALOG) 0.1 % Apply 1 application topically 2 (two) times daily. To hand rash as needed. 10/18/13  Yes Brendan Agreste, MD  ibuprofen (ADVIL,MOTRIN) 200 MG tablet Take 400-800 mg by mouth every 8 (eight) hours as needed for moderate pain.    Historical Provider, MD   History   Social History  . Marital Status: Married    Spouse Name: N/A    Number of Children: N/A  . Years of Education: N/A   Occupational History  . Not on file.   Social History Main Topics  . Smoking status: Former Research scientist (life sciences)  . Smokeless tobacco: Not  on file  . Alcohol Use: No  . Drug Use: No  . Sexual Activity: Not on file   Other Topics Concern  . Not on file   Social History Narrative  . No narrative on file     Objective:  Physical Exam  Vitals reviewed. Constitutional: He is oriented to person, place, and time. He appears well-developed and well-nourished.  HENT:  Head: Normocephalic and atraumatic.  Eyes: EOM are normal. Pupils are equal, round, and reactive to light.  Neck: No JVD present. Carotid bruit is not present.  Cardiovascular: Normal rate, regular rhythm and normal heart sounds.   No murmur heard. Pulmonary/Chest: Effort normal and breath sounds normal. He has no rales.  Musculoskeletal: He exhibits no edema.  Right,  second toe is enlarged with inward curve of overgrown toenail that overlaps over the lateral nail fold without surrounding discharge.  Neurological: He is alert and oriented to person, place, and time.  Skin: Skin is warm and dry.  Psychiatric: He has a normal mood and affect.    Results for orders placed in visit on 03/07/14  GLUCOSE, POCT (MANUAL RESULT ENTRY)      Result Value Ref Range   POC Glucose 109 (*) 70 - 99 mg/dl  POCT GLYCOSYLATED HEMOGLOBIN (HGB A1C)      Result Value Ref Range   Hemoglobin A1C 7.0      Filed Vitals:   03/07/14 1653  BP: 126/82  Pulse: 57  Temp: 98.4 F (36.9 C)  TempSrc: Oral  Resp: 16  Height: 6' 0.5" (1.842 m)  Weight: 202 lb 6.4 oz (91.808 kg)  SpO2: 100%    Assessment & Plan:  5:31 PM- Patient informed of current plan for treatment and evaluation and agrees with plan at this time. Nazir Hacker is a 58 y.o. male Type II or unspecified type diabetes mellitus without mention of complication, not stated as uncontrolled - Plan: HM Diabetes Foot Exam, POCT glucose (manual entry), POCT glycosylated hemoglobin (Hb A1C), Lipid panel, COMPLETE METABOLIC PANEL WITH GFR, metFORMIN (GLUCOPHAGE) 500 MG tablet  -controlled diabetes. Commended on improved  reading and to continue to watch diet, exercise, and no changes in meds at this point. On ASA, statin as below, and ACE-I.    Need for prophylactic vaccination and inoculation against influenza - Plan: Flu Vaccine QUAD 36+ mos IM - given.   Essential hypertension - Plan: POCT glucose (manual entry), POCT glycosylated hemoglobin (Hb A1C), Lipid panel, COMPLETE METABOLIC PANEL WITH GFR, hydrochlorothiazide (HYDRODIURIL) 25 MG tablet, lisinopril (PRINIVIL,ZESTRIL) 20 MG tablet  -stable. No med changes.   Hyperlipidemia - Plan: POCT glucose (manual entry), POCT glycosylated hemoglobin (Hb A1C), Lipid panel, COMPLETE METABOLIC PANEL WITH GFR, atorvastatin (LIPITOR) 10 MG tablet  Changed zocor to lipitor with elevated ASCVD 10 year risk and diabetes with possible goal of 40mg  qd, but can start at 10mg  to make sure switch is tolerated.   Nail overgrowth - Plan: Ambulatory referral to Podiatry  -thickened, curled toenail. Possible onychomycotic, but due to curved/ingrowing appearance, and underlying DM2, will refer to podiatry for trimming and foot care.   Recheck in 3 months, sooner if new/worsening sx's.   Meds ordered this encounter  Medications  . hydrochlorothiazide (HYDRODIURIL) 25 MG tablet    Sig: Take 1 tablet (25 mg total) by mouth every morning.    Dispense:  90 tablet    Refill:  1  . lisinopril (PRINIVIL,ZESTRIL) 20 MG tablet    Sig: TAKE ONE TABLET BY MOUTH IN THE MORNING    Dispense:  90 tablet    Refill:  1  . metFORMIN (GLUCOPHAGE) 500 MG tablet    Sig: TAKE ONE TABLET BY MOUTH TWICE DAILY WITH MEALS    Dispense:  180 tablet    Refill:  1  . atorvastatin (LIPITOR) 10 MG tablet    Sig: Take 1 tablet (10 mg total) by mouth daily.    Dispense:  90 tablet    Refill:  1   Patient Instructions  We will refer you to podiatry.  You should receive a call or letter about your lab results within the next week to 10 days.  Changing cholesterol medicine to Lipitor once per day. If  any new side effects with this medicine -  let me know.      I personally performed the services described in this documentation, which was scribed in my presence. The recorded information has been reviewed and considered, and addended by me as needed.

## 2014-03-07 NOTE — Patient Instructions (Signed)
We will refer you to podiatry.  You should receive a call or letter about your lab results within the next week to 10 days.  Changing cholesterol medicine to Lipitor once per day. If any new side effects with this medicine - let me know.

## 2014-03-11 ENCOUNTER — Other Ambulatory Visit: Payer: Self-pay | Admitting: Family Medicine

## 2014-04-15 ENCOUNTER — Telehealth: Payer: Self-pay | Admitting: Neurology

## 2014-04-15 NOTE — Telephone Encounter (Signed)
Left message for patient regarding scheduling an appointment with Dr. Jaynee Eagles for headaches if needed (was scheduled to see Dr. Janann Colonel for the first time in May 2015 but patient cancelled appointment).

## 2014-04-18 ENCOUNTER — Other Ambulatory Visit: Payer: Self-pay | Admitting: Family Medicine

## 2014-04-18 ENCOUNTER — Ambulatory Visit (INDEPENDENT_AMBULATORY_CARE_PROVIDER_SITE_OTHER): Payer: BC Managed Care – PPO

## 2014-04-18 ENCOUNTER — Ambulatory Visit (INDEPENDENT_AMBULATORY_CARE_PROVIDER_SITE_OTHER): Payer: BC Managed Care – PPO | Admitting: Internal Medicine

## 2014-04-18 VITALS — BP 122/68 | HR 63 | Temp 98.0°F | Resp 17 | Ht 72.5 in | Wt 202.0 lb

## 2014-04-18 DIAGNOSIS — M25569 Pain in unspecified knee: Secondary | ICD-10-CM

## 2014-04-18 DIAGNOSIS — M25552 Pain in left hip: Secondary | ICD-10-CM

## 2014-04-18 DIAGNOSIS — M1711 Unilateral primary osteoarthritis, right knee: Secondary | ICD-10-CM

## 2014-04-18 DIAGNOSIS — M179 Osteoarthritis of knee, unspecified: Secondary | ICD-10-CM

## 2014-04-18 LAB — POCT CBC
Granulocyte percent: 58.6 %G (ref 37–80)
HCT, POC: 37.3 % — AB (ref 43.5–53.7)
Hemoglobin: 11.8 g/dL — AB (ref 14.1–18.1)
Lymph, poc: 2.1 (ref 0.6–3.4)
MCH: 27 pg (ref 27–31.2)
MCHC: 31.6 g/dL — AB (ref 31.8–35.4)
MCV: 85.4 fL (ref 80–97)
MID (cbc): 0.4 (ref 0–0.9)
MPV: 7.1 fL (ref 0–99.8)
PLATELET COUNT, POC: 252 10*3/uL (ref 142–424)
POC Granulocyte: 3.6 (ref 2–6.9)
POC LYMPH %: 34.9 % (ref 10–50)
POC MID %: 6.5 %M (ref 0–12)
RBC: 4.37 M/uL — AB (ref 4.69–6.13)
RDW, POC: 14.1 %
WBC: 6.1 10*3/uL (ref 4.6–10.2)

## 2014-04-18 LAB — POCT SEDIMENTATION RATE: POCT SED RATE: 38 mm/hr — AB (ref 0–22)

## 2014-04-18 MED ORDER — TRAMADOL HCL 50 MG PO TABS: 50.0000 mg | ORAL_TABLET | Freq: Four times a day (QID) | ORAL | Status: DC | PRN

## 2014-04-18 MED ORDER — MELOXICAM 7.5 MG PO TABS: 7.5000 mg | ORAL_TABLET | Freq: Every day | ORAL | Status: DC

## 2014-04-18 NOTE — Progress Notes (Signed)
Subjective:    Patient ID: Brendan Gonzales, male    DOB: 11/20/1955, 58 y.o.   MRN: [id number]  HPI Patient presents this morning complaining of bilateral knee pain, left hip pain and low back pain.  He has had problems with his knees for many years and has been recently seen by Dr. Alvan Dame and has been receiving injections into his knees with some relief. Has been taking ibuprofen 800 mg and acetaminophen 500 mg with relief for a couple of hours. His left hip started hurting him 6 days ago. It feels "weak" and he has been using a cane. According to his medication list, he has been given prescriptions for meloxicam and tramadol. He states that he has meloxicam at home but has not taken recently and he does not recall filling the tramadol prescription. He works Special educational needs teacher parts. He is able to sit or stand with his job. He has pain with sitting and standing, when he is working and when he is off from work.   He has no scheduled follow up with Dr. Alvan Dame.   He reports that his BP and glucose are well controlled when he checks them at home.    Review of Systems No fever, no SOB, no chest pain, occasional nonproductive cough, no falls, no numbness or tingling of arms/legs,    Objective:   Physical Exam  Vitals reviewed. Constitutional: He is oriented to person, place, and time. He appears well-developed and well-nourished.  HENT:  Head: Normocephalic and atraumatic.  Eyes: Conjunctivae are normal.  Neck: Normal range of motion. Neck supple.  Cardiovascular: Normal rate, regular rhythm and normal heart sounds.   Pulmonary/Chest: Effort normal and breath sounds normal.  Musculoskeletal:       Left hip: He exhibits decreased range of motion and tenderness.       Cervical back: Normal.       Thoracic back: He exhibits bony tenderness.       Lumbar back: He exhibits bony tenderness.  Difficult to fully assess left hip due to pain in left knee with ROM. Left hip painful to palpation of glute,  non tender over trochanter.   Neurological: He is alert and oriented to person, place, and time.  Skin: Skin is warm and dry.  Psychiatric: He has a normal mood and affect. His behavior is normal. Judgment and thought content normal.   Left hip XRAY. [facility] reading (PRIMARY) by  Dr. Elder Cyphers- no acute abnormality.       Assessment & Plan:  Discussed with Dr. Elder Cyphers who read Xray.  1. Left hip pain - DG Hip Complete Left; Future - POCT CBC - POCT SEDIMENTATION RATE - traMADol (ULTRAM) 50 MG tablet; Take 1 tablet (50 mg total) by mouth every 6 (six) hours as needed for severe pain.  Dispense: 30 tablet; Refill: 0  2. Recurrent knee pain, unspecified laterality - traMADol (ULTRAM) 50 MG tablet; Take 1 tablet (50 mg total) by mouth every 6 (six) hours as needed for severe pain.  Dispense: 30 tablet; Refill: 0  3. Osteoarthritis of right knee, unspecified osteoarthritis type - traMADol (ULTRAM) 50 MG tablet; Take 1 tablet (50 mg total) by mouth every 6 (six) hours as needed for severe pain.  Dispense: 30 tablet; Refill: 0  -Patient instructions- Take meloxicam once a day- do not take this while taking ibuprofen Can take tramadol for severe pain- do not drive or work while taking this medication Apply heat to your hip 3-4 times a day Do  gentle range of motion exercises 3-4 times a day for back/hip/knees. Follow up in 1 week.  Elby Beck, FNP-BC  [facility]  04/18/2014 9:18 AM

## 2014-04-18 NOTE — Patient Instructions (Signed)
Take meloxicam once a day- do not take this while taking ibuprofen Can take tramadol for severe pain- do not drive or work while taking this medication Apply heat to your hip 3-4 times a day Do gentle range of motion exercises 3-4 times a day for back/hip/knees.

## 2014-04-19 ENCOUNTER — Encounter: Payer: Self-pay | Admitting: Internal Medicine

## 2014-04-26 ENCOUNTER — Ambulatory Visit (INDEPENDENT_AMBULATORY_CARE_PROVIDER_SITE_OTHER): Payer: BC Managed Care – PPO | Admitting: Family Medicine

## 2014-04-26 ENCOUNTER — Encounter: Payer: Self-pay | Admitting: Family Medicine

## 2014-04-26 VITALS — BP 120/76 | HR 61 | Temp 98.3°F | Resp 16 | Ht 72.5 in | Wt 204.0 lb

## 2014-04-26 DIAGNOSIS — D649 Anemia, unspecified: Secondary | ICD-10-CM

## 2014-04-26 DIAGNOSIS — I10 Essential (primary) hypertension: Secondary | ICD-10-CM

## 2014-04-26 DIAGNOSIS — R700 Elevated erythrocyte sedimentation rate: Secondary | ICD-10-CM

## 2014-04-26 DIAGNOSIS — M25552 Pain in left hip: Secondary | ICD-10-CM

## 2014-04-26 DIAGNOSIS — E119 Type 2 diabetes mellitus without complications: Secondary | ICD-10-CM

## 2014-04-26 DIAGNOSIS — K219 Gastro-esophageal reflux disease without esophagitis: Secondary | ICD-10-CM

## 2014-04-26 LAB — CBC WITH DIFFERENTIAL/PLATELET
BASOS PCT: 0 % (ref 0–1)
Basophils Absolute: 0 10*3/uL (ref 0.0–0.1)
Eosinophils Absolute: 0.1 10*3/uL (ref 0.0–0.7)
Eosinophils Relative: 2 % (ref 0–5)
HCT: 36.1 % — ABNORMAL LOW (ref 39.0–52.0)
HEMOGLOBIN: 11.6 g/dL — AB (ref 13.0–17.0)
Lymphocytes Relative: 36 % (ref 12–46)
Lymphs Abs: 1.9 10*3/uL (ref 0.7–4.0)
MCH: 27.4 pg (ref 26.0–34.0)
MCHC: 32.1 g/dL (ref 30.0–36.0)
MCV: 85.1 fL (ref 78.0–100.0)
MONOS PCT: 8 % (ref 3–12)
Monocytes Absolute: 0.4 10*3/uL (ref 0.1–1.0)
NEUTROS ABS: 2.9 10*3/uL (ref 1.7–7.7)
Neutrophils Relative %: 54 % (ref 43–77)
Platelets: 281 10*3/uL (ref 150–400)
RBC: 4.24 MIL/uL (ref 4.22–5.81)
RDW: 13.9 % (ref 11.5–15.5)
WBC: 5.3 10*3/uL (ref 4.0–10.5)

## 2014-04-26 LAB — IRON AND TIBC
%SAT: 11 % — AB (ref 20–55)
IRON: 42 ug/dL (ref 42–165)
TIBC: 378 ug/dL (ref 215–435)
UIBC: 336 ug/dL (ref 125–400)

## 2014-04-26 LAB — GLUCOSE, POCT (MANUAL RESULT ENTRY): POC Glucose: 142 mg/dl — AB (ref 70–99)

## 2014-04-26 LAB — RHEUMATOID FACTOR

## 2014-04-26 MED ORDER — METOPROLOL SUCCINATE ER 25 MG PO TB24: 25.0000 mg | ORAL_TABLET | Freq: Every day | ORAL | Status: DC

## 2014-04-26 MED ORDER — HYDROCHLOROTHIAZIDE 25 MG PO TABS: 25.0000 mg | ORAL_TABLET | Freq: Every morning | ORAL | Status: DC

## 2014-04-26 MED ORDER — PANTOPRAZOLE SODIUM 40 MG PO TBEC: 40.0000 mg | DELAYED_RELEASE_TABLET | Freq: Every day | ORAL | Status: DC

## 2014-04-26 NOTE — Progress Notes (Signed)
   Subjective:    Patient ID: Brendan Gonzales, male    DOB: [birth year], 57 y.o.   MRN: [id number]  HPI Patient presents today for follow up or left hip pain. He reports hip pain is greatly improved. He has used tramadol sparingly with good results. Sed rate was 38, will check additional labs tody  He last checked his blood sugar 2 days ago and it was 140. He reports that he does not sleep well due to urinary frequency. He has been taking metformin 500 mg 2 tablets with a meal and then an additional dose at a later meal if his blood sugar is high.   He is up 2 pounds.   He continues to have headaches, relieved with tylenol. Last eye exam 3 months ago.   He is requesting refills of several medications today.  Review of Systems No fever, no cough, no falls     Objective:   Physical Exam  Constitutional: He is oriented to person, place, and time. He appears well-developed and well-nourished.  HENT:  Head: Normocephalic and atraumatic.  Eyes: Conjunctivae are normal.  Neck: Normal range of motion. Neck supple.  Pulmonary/Chest: Effort normal.  Musculoskeletal:  No tenderness over buttock or hip, some lumbar tenderness. Gait normal.   Neurological: He is alert and oriented to person, place, and time.  Skin: Skin is warm and dry.  Psychiatric: He has a normal mood and affect. His behavior is normal. Judgment and thought content normal.  Vitals reviewed.     Assessment & Plan:  1. Left hip pain - ANA - Rheumatoid factor  2. Elevated sed rate - ANA - Rheumatoid factor  3. Anemia, unspecified anemia type - CBC with Differential - Iron and TIBC  4. Type 2 diabetes mellitus without complication - POCT glucose (manual entry) -Encouraged gradual weight loss 5-7 pounds -instructed patient to take metformin 500 mg po BID with meal  5. Essential hypertension - hydrochlorothiazide (HYDRODIURIL) 25 MG tablet; Take 1 tablet (25 mg total) by mouth every morning.  Dispense: 90 tablet;  Refill: 1 - metoprolol succinate (TOPROL-XL) 25 MG 24 hr tablet; Take 1 tablet (25 mg total) by mouth daily.  Dispense: 90 tablet; Refill: 1  6. Gastroesophageal reflux disease, esophagitis presence not specified - pantoprazole (PROTONIX) 40 MG tablet; Take 1 tablet (40 mg total) by mouth daily.  Dispense: 90 tablet; Refill: 0  7. Essential hypertension - hydrochlorothiazide (HYDRODIURIL) 25 MG tablet; Take 1 tablet (25 mg total) by mouth every morning.  Dispense: 90 tablet; Refill: 1 - metoprolol succinate (TOPROL-XL) 25 MG 24 hr tablet; Take 1 tablet (25 mg total) by mouth daily.  Dispense: 90 tablet; Refill: 1  -Will notify him of lab results.  -Follow up in 3 months  Elby Beck, FNP-BC  [facility] [place]  04/26/2014 8:42 AM

## 2014-04-26 NOTE — Patient Instructions (Addendum)
Take metformin 500 mg twice a day with meals Stretch your joints- neck, hips, back, shoulders every day to help with stiffness

## 2014-04-27 LAB — ANA: Anti Nuclear Antibody(ANA): NEGATIVE

## 2014-05-03 ENCOUNTER — Telehealth: Payer: Self-pay | Admitting: *Deleted

## 2014-05-03 NOTE — Telephone Encounter (Signed)
I sent him a note in mychart. His rheumatoid arthritis studies were negative, but he has mild anemia. I would like him to follow up with me or someone else in three months to check on his diabetes and to be checked for sickle cell and thalassemia anemias.

## 2014-05-03 NOTE — Telephone Encounter (Signed)
Pt would like to know lab results from 04/26/14

## 2014-05-31 ENCOUNTER — Telehealth: Payer: Self-pay

## 2014-05-31 NOTE — Telephone Encounter (Signed)
Pt would like to know if he can switch his bp medication to lasartan for his high blood pressure.  Best# [phone]

## 2014-06-01 NOTE — Telephone Encounter (Signed)
More info - why is he requesting switch?

## 2014-06-02 NOTE — Telephone Encounter (Signed)
Lm for rtn call to reason pt wants to change BP medication.

## 2014-06-06 NOTE — Telephone Encounter (Signed)
Pt states he was having headaches but this has resolved. He states he does not want to change his medication at this time. He is due for a follow up appt next month- pt was transferred to scheduling to make an appt.

## 2014-07-18 ENCOUNTER — Telehealth: Payer: Self-pay | Admitting: *Deleted

## 2014-07-18 ENCOUNTER — Encounter: Payer: Self-pay | Admitting: Family Medicine

## 2014-07-18 ENCOUNTER — Ambulatory Visit (INDEPENDENT_AMBULATORY_CARE_PROVIDER_SITE_OTHER): Payer: BLUE CROSS/BLUE SHIELD | Admitting: Family Medicine

## 2014-07-18 VITALS — BP 120/78 | HR 57 | Temp 98.1°F | Resp 16 | Ht 72.05 in | Wt 204.0 lb

## 2014-07-18 DIAGNOSIS — E119 Type 2 diabetes mellitus without complications: Secondary | ICD-10-CM

## 2014-07-18 DIAGNOSIS — M2550 Pain in unspecified joint: Secondary | ICD-10-CM

## 2014-07-18 DIAGNOSIS — K5900 Constipation, unspecified: Secondary | ICD-10-CM

## 2014-07-18 DIAGNOSIS — K219 Gastro-esophageal reflux disease without esophagitis: Secondary | ICD-10-CM

## 2014-07-18 DIAGNOSIS — R1084 Generalized abdominal pain: Secondary | ICD-10-CM

## 2014-07-18 DIAGNOSIS — E785 Hyperlipidemia, unspecified: Secondary | ICD-10-CM

## 2014-07-18 LAB — COMPLETE METABOLIC PANEL WITH GFR
ALK PHOS: 89 U/L (ref 39–117)
ALT: 14 U/L (ref 0–53)
AST: 15 U/L (ref 0–37)
Albumin: 4.3 g/dL (ref 3.5–5.2)
BUN: 9 mg/dL (ref 6–23)
CALCIUM: 9.1 mg/dL (ref 8.4–10.5)
CO2: 28 meq/L (ref 19–32)
CREATININE: 0.61 mg/dL (ref 0.50–1.35)
Chloride: 101 mEq/L (ref 96–112)
GFR, Est Non African American: >89 mL/min
GLUCOSE: 119 mg/dL — AB (ref 70–99)
Potassium: 4.2 mEq/L (ref 3.5–5.3)
Sodium: 139 mEq/L (ref 135–145)
Total Bilirubin: 0.5 mg/dL (ref 0.2–1.2)
Total Protein: 7.1 g/dL (ref 6.0–8.3)

## 2014-07-18 LAB — POCT GLYCOSYLATED HEMOGLOBIN (HGB A1C): Hemoglobin A1C: 6.8

## 2014-07-18 LAB — GLUCOSE, POCT (MANUAL RESULT ENTRY): POC Glucose: 140 mg/dl — AB (ref 70–99)

## 2014-07-18 LAB — CK: CK TOTAL: 50 U/L (ref 7–232)

## 2014-07-18 MED ORDER — OMEPRAZOLE 20 MG PO CPDR: 20.0000 mg | DELAYED_RELEASE_CAPSULE | Freq: Every day | ORAL | Status: DC

## 2014-07-18 MED ORDER — ATORVASTATIN CALCIUM 10 MG PO TABS: 10.0000 mg | ORAL_TABLET | Freq: Every day | ORAL | Status: DC

## 2014-07-18 MED ORDER — METFORMIN HCL 500 MG PO TABS
ORAL_TABLET | ORAL | Status: DC
Start: 2014-07-18 — End: 2014-10-17

## 2014-07-18 MED ORDER — PANTOPRAZOLE SODIUM 40 MG PO TBEC: 40.0000 mg | DELAYED_RELEASE_TABLET | Freq: Every day | ORAL | Status: DC

## 2014-07-18 NOTE — Telephone Encounter (Signed)
Spoke with Edie, Pharmacist, [phone] regarding canceling pts Rx for Omeprazole (Prilosec) 20 mg,  per Dr. Carlota Raspberry.

## 2014-07-18 NOTE — Patient Instructions (Addendum)
Can continue acid blocker - once per day, but if heartburn or food stuck feeling not improving in next 4-6 weeks, let me know and I will refer you back to a gastroenterologist as this can sometimes be from a condition called gastroparesis. For constipation - increase fluids and fiber in diet. If no bowel movement in 2 days, can take Miralax over the counter. See other information below.  If this problem is not improving in the next few weeks - let me know as above so we can refer you to stomach specialist.  You should receive a call or letter about your lab results within the next week to 10 days.  Return to the clinic or go to the nearest emergency room if any of your symptoms worsen or new symptoms occur. I will check muscle enzyme test, but your knee and foot pains are not likely due to the Lipitor. Continue same dose of this medicine.  Diabetes is overall controlled. No new changes in those meds for now.   Constipation Constipation is when a person has fewer than three bowel movements a week, has difficulty having a bowel movement, or has stools that are dry, hard, or larger than normal. As people grow older, constipation is more common. If you try to fix constipation with medicines that make you have a bowel movement (laxatives), the problem may get worse. Long-term laxative use may cause the muscles of the colon to become weak. A low-fiber diet, not taking in enough fluids, and taking certain medicines may make constipation worse.  CAUSES   Certain medicines, such as antidepressants, pain medicine, iron supplements, antacids, and water pills.   Certain diseases, such as diabetes, irritable bowel syndrome (IBS), thyroid disease, or depression.   Not drinking enough water.   Not eating enough fiber-rich foods.   Stress or travel.   Lack of physical activity or exercise.   Ignoring the urge to have a bowel movement.   Using laxatives too much.  SIGNS AND SYMPTOMS   Having fewer  than three bowel movements a week.   Straining to have a bowel movement.   Having stools that are hard, dry, or larger than normal.   Feeling full or bloated.   Pain in the lower abdomen.   Not feeling relief after having a bowel movement.  DIAGNOSIS  Your health care provider will take a medical history and perform a physical exam. Further testing may be done for severe constipation. Some tests may include:  A barium enema X-ray to examine your rectum, colon, and, sometimes, your small intestine.   A sigmoidoscopy to examine your lower colon.   A colonoscopy to examine your entire colon. TREATMENT  Treatment will depend on the severity of your constipation and what is causing it. Some dietary treatments include drinking more fluids and eating more fiber-rich foods. Lifestyle treatments may include regular exercise. If these diet and lifestyle recommendations do not help, your health care provider may recommend taking over-the-counter laxative medicines to help you have bowel movements. Prescription medicines may be prescribed if over-the-counter medicines do not work.  HOME CARE INSTRUCTIONS   Eat foods that have a lot of fiber, such as fruits, vegetables, whole grains, and beans.  Limit foods high in fat and processed sugars, such as french fries, hamburgers, cookies, candies, and soda.   A fiber supplement may be added to your diet if you cannot get enough fiber from foods.   Drink enough fluids to keep your urine clear or pale  yellow.   Exercise regularly or as directed by your health care provider.   Go to the restroom when you have the urge to go. Do not hold it.   Only take over-the-counter or prescription medicines as directed by your health care provider. Do not take other medicines for constipation without talking to your health care provider first.  [place] IF:   You have bright red blood in your stool.   Your constipation lasts  for more than 4 days or gets worse.   You have abdominal or rectal pain.   You have thin, pencil-like stools.   You have unexplained weight loss. MAKE SURE YOU:   Understand these instructions.  Will watch your condition.  Will get help right away if you are not doing well or get worse. Document Released: 03/08/2004 Document Revised: 06/15/2013 Document Reviewed: 03/22/2013 [facility] Patient Information 2015 [place], [place]. This information is not intended to replace advice given to you by your health care provider. Make sure you discuss any questions you have with your health care provider.    Food Choices for Gastroesophageal Reflux Disease When you have gastroesophageal reflux disease (GERD), the foods you eat and your eating habits are very important. Choosing the right foods can help ease the discomfort of GERD. WHAT GENERAL GUIDELINES DO I NEED TO FOLLOW?  Choose fruits, vegetables, whole grains, low-fat dairy products, and low-fat meat, fish, and poultry.  Limit fats such as oils, salad dressings, butter, nuts, and avocado.  Keep a food diary to identify foods that cause symptoms.  Avoid foods that cause reflux. These may be different for different people.  Eat frequent small meals instead of three large meals each day.  Eat your meals slowly, in a relaxed setting.  Limit fried foods.  Cook foods using methods other than frying.  Avoid drinking alcohol.  Avoid drinking large amounts of liquids with your meals.  Avoid bending over or lying down until 2-3 hours after eating. WHAT FOODS ARE NOT RECOMMENDED? The following are some foods and drinks that may worsen your symptoms: Vegetables Tomatoes. Tomato juice. Tomato and spaghetti sauce. Chili peppers. Onion and garlic. Horseradish. Fruits Oranges, grapefruit, and lemon (fruit and juice). Meats High-fat meats, fish, and poultry. This includes hot dogs, ribs, ham, sausage, salami, and bacon. Dairy Whole  milk and chocolate milk. Sour cream. Cream. Butter. Ice cream. Cream cheese.  Beverages Coffee and tea, with or without caffeine. Carbonated beverages or energy drinks. Condiments Hot sauce. Barbecue sauce.  Sweets/Desserts Chocolate and cocoa. Donuts. Peppermint and spearmint. Fats and Oils High-fat foods, including [place] fries and potato chips. Other Vinegar. Strong spices, such as black pepper, white pepper, red pepper, cayenne, curry powder, cloves, ginger, and chili powder. The items listed above may not be a complete list of foods and beverages to avoid. Contact your dietitian for more information. Document Released: 06/10/2005 Document Revised: 06/15/2013 Document Reviewed: 04/14/2013 [facility] Patient Information 2015 [place], [place]. This information is not intended to replace advice given to you by your health care provider. Make sure you discuss any questions you have with your health care provider.

## 2014-07-18 NOTE — Progress Notes (Signed)
Subjective:  This chart was scribed for Brendan Ray, MD by Brendan Gonzales, ED Scribe at [facility].The patient was seen in exam room 22 and the patient's care was started at 5:00 PM.   Patient ID: Brendan Gonzales, male    DOB: 23-Oct-1955, 59 y.o.   MRN: [id number] Chief Complaint  Patient presents with  . Diabetes  . Hyperlipidemia  . Gastrophageal Reflux    Chronic  . Constipation    Chronic   HPI HPI Comments: Brendan Gonzales is a 59 y.o. male with a history of type II DM and HTN who presents to [facility] here for a follow up for diabetes and multiple concerns.  DM: last office visit with me was Sept 2015, he weight decrease from 211 to 202 taking metformin 1000 mg in the morning and 500 mg at night. He also takes aspirin 81 mg qd. His A1c was 7.0. Plan for a follow up in three months. No med changes that time. Yesterday his blood sugar was 149, the highest in the last 2 months was 240. The lowest was 121.  HTN: Pt is on lisinopril as well as HCTZ, blood pressure was controlled at last visit, no changes.  HLD: We changed his Zocor to Lipitor in Sept due to elevated ASCVD 10 year risk underlying Diabetes. He was initially on 10 mg qd. He has some pains in his knees, ankles and feet.  Since seen by D. Gessner for left hip pain, low back pain and bilateral knee pain. He had an elevated sed rate of 38. He had Negative ANA with a normal rheumatoid factor.  Also complains of constipation and reflux. He had a colonoscopy in 2013, by Dr. Michail Sermon, he had small colon polyp removed that was a hyperplastic polyp.  He used to take protonix for his reflux, acid reflux usually occurs after eating. The constipation is usually for 3-4 days and his stool is hard. He is not taking a fiber supplement. He urinates frequently and the urine is initially dark but becomes clear after drinking water. He gets steroid injections in his feet about evert 3 months.   Pt also complains of fatigue, unsure  if this is due to any of the medication he is taking.   Patient Active Problem List   Diagnosis Date Noted  . Type 2 diabetes mellitus 06/13/2013  . HTN (hypertension) 06/13/2013  . Normal coronary arteries- 2008 06/13/2013  . Chest pain with moderate risk of acute coronary syndrome 06/12/2013  . Special screening for malignant neoplasms, colon 06/27/2011  . Arthritis of knee 06/14/2011   Past Medical History  Diagnosis Date  . Hypertension     benign  . Diabetes mellitus     type 2  . Arthritis   . Headache(784.0)   . Hyperlipidemia   . Constipation   . Dizziness    Past Surgical History  Procedure Laterality Date  . Colonoscopy    . Right femeroral bypass    . Right knee surgery    . Coronary angioplasty    . Hemorrhoid surgery     No Known Allergies Prior to Admission medications   Medication Sig Start Date End Date Taking? Authorizing Provider  acetaminophen (TYLENOL) 500 MG tablet Take 500 mg by mouth every 6 (six) hours as needed.   Yes Historical Provider, MD  aspirin 81 MG tablet Take 81 mg by mouth every morning.    Yes Historical Provider, MD  atorvastatin (LIPITOR) 10 MG tablet Take  1 tablet (10 mg total) by mouth daily. 03/07/14  Yes [name], MD  Cyanocobalamin (VITAMIN B 12 PO) Take 1,000 mg by mouth daily.   Yes Historical Provider, MD  GARLIC PO Take 1 capsule by mouth every morning.   Yes Historical Provider, MD  hydrochlorothiazide (HYDRODIURIL) 25 MG tablet Take 1 tablet (25 mg total) by mouth every morning. 04/26/14  Yes Elby Beck, FNP  lisinopril (PRINIVIL,ZESTRIL) 20 MG tablet TAKE ONE TABLET BY MOUTH IN THE MORNING 03/07/14  Yes [name], MD  metFORMIN (GLUCOPHAGE) 500 MG tablet TAKE ONE TABLET BY MOUTH TWICE DAILY WITH MEALS 03/07/14  Yes [name], MD  metoprolol succinate (TOPROL-XL) 25 MG 24 hr tablet Take 1 tablet (25 mg total) by mouth daily. 04/26/14  Yes Elby Beck, FNP  Multiple Vitamins-Minerals (MENS 50+  MULTI VITAMIN/MIN PO) Take by mouth daily.   Yes Historical Provider, MD  pantoprazole (PROTONIX) 40 MG tablet Take 1 tablet (40 mg total) by mouth daily. 04/26/14  Yes Elby Beck, FNP  traMADol (ULTRAM) 50 MG tablet Take 1 tablet (50 mg total) by mouth every 6 (six) hours as needed for severe pain. 04/18/14  Yes Elby Beck, FNP  triamcinolone cream (KENALOG) 0.1 % Apply 1 application topically 2 (two) times daily. To hand rash as needed. 10/18/13  Yes [name], MD  meloxicam (MOBIC) 7.5 MG tablet Take 1 tablet (7.5 mg total) by mouth daily. Patient not taking: Reported on 07/18/2014 04/18/14   Elby Beck, FNP   History   Social History  . Marital Status: Married    Spouse Name: N/A    Number of Children: N/A  . Years of Education: N/A   Occupational History  . Not on file.   Social History Main Topics  . Smoking status: Former Research scientist (life sciences)  . Smokeless tobacco: Not on file  . Alcohol Use: No  . Drug Use: No  . Sexual Activity: Not on file   Other Topics Concern  . Not on file   Social History Narrative     Review of Systems  Constitutional: Negative for fatigue and unexpected weight change.  Eyes: Negative for visual disturbance.  Respiratory: Negative for cough, chest tightness and shortness of breath.   Cardiovascular: Negative for chest pain, palpitations and leg swelling.  Gastrointestinal: Negative for nausea, vomiting, abdominal pain, diarrhea and blood in stool.  Neurological: Positive for headaches. Negative for dizziness and light-headedness.      Objective:  BP 120/78 mmHg  Pulse 57  Temp(Src) 98.1 F (36.7 C) (Oral)  Resp 16  Ht 6' 0.05" (1.83 m)  Wt 204 lb (92.534 kg)  BMI 27.63 kg/m2  SpO2 97%  Physical Exam  Constitutional: He is oriented to person, place, and time. He appears well-developed and well-nourished.  HENT:  Head: Normocephalic and atraumatic.  Eyes: EOM are normal. Pupils are equal, round, and reactive to light.    Neck: No JVD present. Carotid bruit is not present.  Cardiovascular: Normal rate, regular rhythm and normal heart sounds.   No murmur heard. Pulmonary/Chest: Effort normal and breath sounds normal. He has no rales.  Abdominal:  Diffuse, mild and generalized TTP.  Musculoskeletal: He exhibits no edema.  Neurological: He is alert and oriented to person, place, and time.  Skin: Skin is warm and dry.  Psychiatric: He has a normal mood and affect.  Vitals reviewed.  Results for orders placed or performed in visit on 07/18/14  POCT glucose (  manual entry)  Result Value Ref Range   POC Glucose 140 (A) 70 - 99 mg/dl  POCT glycosylated hemoglobin (Hb A1C)  Result Value Ref Range   Hemoglobin A1C 6.8        Assessment & Plan:    Marquavion Venhuizen is a 59 y.o. male Diabetes mellitus without complication - Plan: POCT glucose (manual entry), POCT glycosylated hemoglobin (Hb A1C), Microalbumin, urine, HM Diabetes Foot Exam  - overall controlled. Cont metformin 1000mg  Qam, 500mg  Qpm.   Hyperlipidemia - Plan: atorvastatin (LIPITOR) 10 MG tablet, DISCONTINUED: atorvastatin (LIPITOR) 10 MG tablet  -chronic knee pains and arthralgias, doubt statin related, but will check CK level. Cont lipitor at same dose.   Arthralgia of multiple joints - Plan: CK  Gastroesophageal reflux disease, esophagitis presence not specified - Plan: pantoprazole (PROTONIX) 40 MG tablet, DISCONTINUED: omeprazole (PRILOSEC) 20 MG capsule - ok to cont PPI for now, but if not improving in next 4-6 weeks, consider GI eval to r/o gastroparesis with underlying DM2.   Generalized abdominal pain - Plan: COMPLETE METABOLIC PANEL WITH GFR, Constipation, unspecified constipation type  - fiber. Fluid in diet and miralax if needed. rtc precuations.  Check CMp but suspected cramping abd pain with constipation. If persists with treatment of constipation - RTC or GI eval.  ER if needed for acute worsening.    Meds ordered this encounter   Medications  . DISCONTD: atorvastatin (LIPITOR) 10 MG tablet    Sig: Take 1 tablet (10 mg total) by mouth daily.    Dispense:  90 tablet    Refill:  1  . DISCONTD: omeprazole (PRILOSEC) 20 MG capsule    Sig: Take 1 capsule (20 mg total) by mouth daily.    Dispense:  30 capsule    Refill:  1  . pantoprazole (PROTONIX) 40 MG tablet    Sig: Take 1 tablet (40 mg total) by mouth daily.    Dispense:  30 tablet    Refill:  1  . metFORMIN (GLUCOPHAGE) 500 MG tablet    Sig: Take 2 tabs by mouth in morning with meal, take one tab by mouth in the evening with meal    Dispense:  135 tablet    Refill:  1  . atorvastatin (LIPITOR) 10 MG tablet    Sig: Take 1 tablet (10 mg total) by mouth daily.    Dispense:  90 tablet    Refill:  1   Patient Instructions  Can continue acid blocker - once per day, but if heartburn or food stuck feeling not improving in next 4-6 weeks, let me know and I will refer you back to a gastroenterologist as this can sometimes be from a condition called gastroparesis. For constipation - increase fluids and fiber in diet. If no bowel movement in 2 days, can take Miralax over the counter. See other information below.  If this problem is not improving in the next few weeks - let me know as above so we can refer you to stomach specialist.  You should receive a call or letter about your lab results within the next week to 10 days.  Return to the clinic or go to the nearest emergency room if any of your symptoms worsen or new symptoms occur. I will check muscle enzyme test, but your knee and foot pains are not likely due to the Lipitor. Continue same dose of this medicine.  Diabetes is overall controlled. No new changes in those meds for now.   Constipation Constipation is  when a person has fewer than three bowel movements a week, has difficulty having a bowel movement, or has stools that are dry, hard, or larger than normal. As people grow older, constipation is more common. If  you try to fix constipation with medicines that make you have a bowel movement (laxatives), the problem may get worse. Long-term laxative use may cause the muscles of the colon to become weak. A low-fiber diet, not taking in enough fluids, and taking certain medicines may make constipation worse.  CAUSES   Certain medicines, such as antidepressants, pain medicine, iron supplements, antacids, and water pills.   Certain diseases, such as diabetes, irritable bowel syndrome (IBS), thyroid disease, or depression.   Not drinking enough water.   Not eating enough fiber-rich foods.   Stress or travel.   Lack of physical activity or exercise.   Ignoring the urge to have a bowel movement.   Using laxatives too much.  SIGNS AND SYMPTOMS   Having fewer than three bowel movements a week.   Straining to have a bowel movement.   Having stools that are hard, dry, or larger than normal.   Feeling full or bloated.   Pain in the lower abdomen.   Not feeling relief after having a bowel movement.  DIAGNOSIS  Your health care provider will take a medical history and perform a physical exam. Further testing may be done for severe constipation. Some tests may include:  A barium enema X-Gonzales to examine your rectum, colon, and, sometimes, your small intestine.   A sigmoidoscopy to examine your lower colon.   A colonoscopy to examine your entire colon. TREATMENT  Treatment will depend on the severity of your constipation and what is causing it. Some dietary treatments include drinking more fluids and eating more fiber-rich foods. Lifestyle treatments may include regular exercise. If these diet and lifestyle recommendations do not help, your health care provider may recommend taking over-the-counter laxative medicines to help you have bowel movements. Prescription medicines may be prescribed if over-the-counter medicines do not work.  HOME CARE INSTRUCTIONS   Eat foods that have a lot  of fiber, such as fruits, vegetables, whole grains, and beans.  Limit foods high in fat and processed sugars, such as french fries, hamburgers, cookies, candies, and soda.   A fiber supplement may be added to your diet if you cannot get enough fiber from foods.   Drink enough fluids to keep your urine clear or pale yellow.   Exercise regularly or as directed by your health care provider.   Go to the restroom when you have the urge to go. Do not hold it.   Only take over-the-counter or prescription medicines as directed by your health care provider. Do not take other medicines for constipation without talking to your health care provider first.  [place] IF:   You have bright red blood in your stool.   Your constipation lasts for more than 4 days or gets worse.   You have abdominal or rectal pain.   You have thin, pencil-like stools.   You have unexplained weight loss. MAKE SURE YOU:   Understand these instructions.  Will watch your condition.  Will get help right away if you are not doing well or get worse. Document Released: 03/08/2004 Document Revised: 06/15/2013 Document Reviewed: 03/22/2013 [facility] Patient Information 2015 [place], [place]. This information is not intended to replace advice given to you by your health care provider. Make sure you discuss any questions you have  with your health care provider.    Food Choices for Gastroesophageal Reflux Disease When you have gastroesophageal reflux disease (GERD), the foods you eat and your eating habits are very important. Choosing the right foods can help ease the discomfort of GERD. WHAT GENERAL GUIDELINES DO I NEED TO FOLLOW?  Choose fruits, vegetables, whole grains, low-fat dairy products, and low-fat meat, fish, and poultry.  Limit fats such as oils, salad dressings, butter, nuts, and avocado.  Keep a food diary to identify foods that cause symptoms.  Avoid foods that cause reflux.  These may be different for different people.  Eat frequent small meals instead of three large meals each day.  Eat your meals slowly, in a relaxed setting.  Limit fried foods.  Cook foods using methods other than frying.  Avoid drinking alcohol.  Avoid drinking large amounts of liquids with your meals.  Avoid bending over or lying down until 2-3 hours after eating. WHAT FOODS ARE NOT RECOMMENDED? The following are some foods and drinks that may worsen your symptoms: Vegetables Tomatoes. Tomato juice. Tomato and spaghetti sauce. Chili peppers. Onion and garlic. Horseradish. Fruits Oranges, grapefruit, and lemon (fruit and juice). Meats High-fat meats, fish, and poultry. This includes hot dogs, ribs, ham, sausage, salami, and bacon. Dairy Whole milk and chocolate milk. Sour cream. Cream. Butter. Ice cream. Cream cheese.  Beverages Coffee and tea, with or without caffeine. Carbonated beverages or energy drinks. Condiments Hot sauce. Barbecue sauce.  Sweets/Desserts Chocolate and cocoa. Donuts. Peppermint and spearmint. Fats and Oils High-fat foods, including [place] fries and potato chips. Other Vinegar. Strong spices, such as black pepper, white pepper, red pepper, cayenne, curry powder, cloves, ginger, and chili powder. The items listed above may not be a complete list of foods and beverages to avoid. Contact your dietitian for more information. Document Released: 06/10/2005 Document Revised: 06/15/2013 Document Reviewed: 04/14/2013 [facility] Patient Information 2015 [place], [place]. This information is not intended to replace advice given to you by your health care provider. Make sure you discuss any questions you have with your health care provider.   I personally performed the services described in this documentation, which was scribed in my presence. The recorded information has been reviewed and considered, and addended by me as needed.

## 2014-07-19 LAB — MICROALBUMIN, URINE: Microalb, Ur: 0.9 mg/dL (ref <2.0)

## 2014-08-03 ENCOUNTER — Other Ambulatory Visit: Payer: Self-pay | Admitting: Family Medicine

## 2014-08-05 NOTE — Telephone Encounter (Signed)
Refilled, can use up to once per day if needed, but follow up prior to it running out to discuss arthralgias and the plan further as long term use of this medicine has risks that need to be discussed.

## 2014-08-05 NOTE — Telephone Encounter (Signed)
Dr Carlota Raspberry, Jackelyn Poling Rxd this for pt most recently, but didn't give RFs, and pt has used a couple of other times in past. You recently saw pt for arthralgias, and since he is your patient wanted to ask you if you would like pt to continue this medication?

## 2014-10-13 ENCOUNTER — Other Ambulatory Visit: Payer: Self-pay | Admitting: Family Medicine

## 2014-10-17 ENCOUNTER — Ambulatory Visit (INDEPENDENT_AMBULATORY_CARE_PROVIDER_SITE_OTHER): Payer: BLUE CROSS/BLUE SHIELD | Admitting: Family Medicine

## 2014-10-17 ENCOUNTER — Encounter: Payer: Self-pay | Admitting: Family Medicine

## 2014-10-17 VITALS — BP 127/69 | HR 65 | Temp 99.5°F | Resp 16 | Ht 72.75 in | Wt 210.2 lb

## 2014-10-17 DIAGNOSIS — L309 Dermatitis, unspecified: Secondary | ICD-10-CM | POA: Diagnosis not present

## 2014-10-17 DIAGNOSIS — E785 Hyperlipidemia, unspecified: Secondary | ICD-10-CM

## 2014-10-17 DIAGNOSIS — J309 Allergic rhinitis, unspecified: Secondary | ICD-10-CM

## 2014-10-17 DIAGNOSIS — E119 Type 2 diabetes mellitus without complications: Secondary | ICD-10-CM | POA: Diagnosis not present

## 2014-10-17 DIAGNOSIS — K219 Gastro-esophageal reflux disease without esophagitis: Secondary | ICD-10-CM | POA: Diagnosis not present

## 2014-10-17 DIAGNOSIS — L259 Unspecified contact dermatitis, unspecified cause: Secondary | ICD-10-CM

## 2014-10-17 DIAGNOSIS — I10 Essential (primary) hypertension: Secondary | ICD-10-CM

## 2014-10-17 LAB — COMPREHENSIVE METABOLIC PANEL
ALK PHOS: 67 U/L (ref 39–117)
ALT: 13 U/L (ref 0–53)
AST: 16 U/L (ref 0–37)
Albumin: 4.2 g/dL (ref 3.5–5.2)
BUN: 9 mg/dL (ref 6–23)
CO2: 27 meq/L (ref 19–32)
Calcium: 9.1 mg/dL (ref 8.4–10.5)
Chloride: 100 mEq/L (ref 96–112)
Creat: 0.66 mg/dL (ref 0.50–1.35)
Glucose, Bld: 193 mg/dL — ABNORMAL HIGH (ref 70–99)
POTASSIUM: 3.8 meq/L (ref 3.5–5.3)
Sodium: 136 mEq/L (ref 135–145)
Total Bilirubin: 0.4 mg/dL (ref 0.2–1.2)
Total Protein: 6.7 g/dL (ref 6.0–8.3)

## 2014-10-17 LAB — LIPID PANEL
CHOLESTEROL: 153 mg/dL (ref 0–200)
HDL: 41 mg/dL (ref >=40)
LDL CALC: 58 mg/dL (ref 0–99)
TRIGLYCERIDES: 270 mg/dL — AB (ref <150)
Total CHOL/HDL Ratio: 3.7 Ratio
VLDL: 54 mg/dL — ABNORMAL HIGH (ref 0–40)

## 2014-10-17 LAB — POCT GLYCOSYLATED HEMOGLOBIN (HGB A1C): HEMOGLOBIN A1C: 6.9

## 2014-10-17 LAB — GLUCOSE, POCT (MANUAL RESULT ENTRY): POC Glucose: 225 mg/dl — AB (ref 70–99)

## 2014-10-17 MED ORDER — METFORMIN HCL 500 MG PO TABS: ORAL_TABLET | ORAL | Status: DC

## 2014-10-17 MED ORDER — METOPROLOL SUCCINATE ER 25 MG PO TB24: 25.0000 mg | ORAL_TABLET | Freq: Every day | ORAL | Status: DC

## 2014-10-17 MED ORDER — FLUTICASONE PROPIONATE 50 MCG/ACT NA SUSP: 2.0000 | Freq: Every day | NASAL | Status: DC

## 2014-10-17 MED ORDER — HYDROCHLOROTHIAZIDE 25 MG PO TABS: 25.0000 mg | ORAL_TABLET | Freq: Every morning | ORAL | Status: DC

## 2014-10-17 MED ORDER — TRIAMCINOLONE ACETONIDE 0.1 % EX CREA: 1.0000 "application " | TOPICAL_CREAM | Freq: Two times a day (BID) | CUTANEOUS | Status: DC

## 2014-10-17 MED ORDER — LISINOPRIL 20 MG PO TABS: 20.0000 mg | ORAL_TABLET | Freq: Every morning | ORAL | Status: DC

## 2014-10-17 MED ORDER — PANTOPRAZOLE SODIUM 40 MG PO TBEC: 40.0000 mg | DELAYED_RELEASE_TABLET | Freq: Every day | ORAL | Status: DC

## 2014-10-17 NOTE — Progress Notes (Signed)
Subjective:  This chart was scribed for Brendan Ray, MD by [facility] medical scribe at [facility].The patient was seen in exam room 25 and the patient's care was started at 4:47 PM.   Patient ID: Brendan Gonzales, male    DOB: Jun 29, 1955, 59 y.o.   MRN: [id number] Chief Complaint  Patient presents with  . Diabetes  . Hypertension  . Rash    on chest and neck x weeks  . Sinusitis    allergies   HPI  HPI Comments: Brendan Gonzales is a 59 y.o. male with a history of type II diabetes and hypertension who presents to [facility] for a follow up for diabetes but also has concern of sinusitis and rash. Last seen by me on 07/18/2014.  Diabetes: Taking metformin 1000 mg in the morning 500 mg at night. Last A1C was 6.8 07/18/2014. He is on a statin and ACE inhibitor. Last lipid panel September 2015, stable. Negative urine micro albumin in January. No side effects with his metformin.   Hypertension: Takes lisinopril 20 mg daily, Toprol-XL 25 mg daily, HCTZ 25 mg daily, also taking aspirin 81 mg daily. Blood pressure outside the office runs around 139-149/92. Lab Results  Component Value Date   CREATININE 0.61 07/18/2014   CREATININE 0.66 03/07/2014   CREATININE 0.64 06/13/2013   GERD: Still taking pantoprazole 40 mg for acid reflex.   Chronic Headache: Still gets headaches, he takes tylenol 500 mg which typically helps.  Rash: Pt also complains of a rash on his upper chest which began 1 day ago. He does spray cologne in that area. Pt states his the palm of his hands and in between the digits itch.  Sinusitis: Began a couple months ago. He is taking generic allegra for no relief. Pt states becomes congested, and his eyes are watery and itchy. He has not taken a nasal spray for his allergies   Patient Active Problem List   Diagnosis Date Noted  . Type 2 diabetes mellitus 06/13/2013  . HTN (hypertension) 06/13/2013  . Normal coronary arteries- 2008  06/13/2013  . Chest pain with moderate risk of acute coronary syndrome 06/12/2013  . Special screening for malignant neoplasms, colon 06/27/2011  . Arthritis of knee 06/14/2011   Past Medical History  Diagnosis Date  . Hypertension     benign  . Diabetes mellitus     type 2  . Arthritis   . Headache(784.0)   . Hyperlipidemia   . Constipation   . Dizziness    Past Surgical History  Procedure Laterality Date  . Colonoscopy    . Right femeroral bypass    . Right knee surgery    . Coronary angioplasty    . Hemorrhoid surgery     No Known Allergies Prior to Admission medications   Medication Sig Start Date End Date Taking? Authorizing Provider  acetaminophen (TYLENOL) 500 MG tablet Take 500 mg by mouth every 6 (six) hours as needed.    Historical Provider, MD  aspirin 81 MG tablet Take 81 mg by mouth every morning.     Historical Provider, MD  atorvastatin (LIPITOR) 10 MG tablet Take 1 tablet (10 mg total) by mouth daily. 07/18/14   [name], MD  Cyanocobalamin (VITAMIN B 12 PO) Take 1,000 mg by mouth daily.    Historical Provider, MD  GARLIC PO Take 1 capsule by mouth every morning.    Historical Provider, MD  hydrochlorothiazide (HYDRODIURIL) 25 MG  tablet Take 1 tablet (25 mg total) by mouth every morning. 04/26/14   Elby Beck, FNP  lisinopril (PRINIVIL,ZESTRIL) 20 MG tablet TAKE ONE TABLET BY MOUTH IN THE MORNING 03/07/14   [name], MD  lisinopril (PRINIVIL,ZESTRIL) 20 MG tablet TAKE ONE TABLET BY MOUTH IN THE MORNING 10/13/14   Merlinda Frederick McVeigh, PA  meloxicam (MOBIC) 7.5 MG tablet TAKE ONE TABLET BY MOUTH ONCE DAILY 08/05/14   [name], MD  metFORMIN (GLUCOPHAGE) 500 MG tablet Take 2 tabs by mouth in morning with meal, take one tab by mouth in the evening with meal 07/18/14   [name], MD  metoprolol succinate (TOPROL-XL) 25 MG 24 hr tablet Take 1 tablet (25 mg total) by mouth daily. 04/26/14   Elby Beck, FNP  Multiple Vitamins-Minerals  (MENS 50+ MULTI VITAMIN/MIN PO) Take by mouth daily.    Historical Provider, MD  pantoprazole (PROTONIX) 40 MG tablet TAKE ONE TABLET BY MOUTH ONCE DAILY 10/13/14   Merlinda Frederick McVeigh, PA  traMADol (ULTRAM) 50 MG tablet Take 1 tablet (50 mg total) by mouth every 6 (six) hours as needed for severe pain. 04/18/14   Elby Beck, FNP  triamcinolone cream (KENALOG) 0.1 % Apply 1 application topically 2 (two) times daily. To hand rash as needed. 10/18/13   [name], MD   History   Social History  . Marital Status: Married    Spouse Name: N/A  . Number of Children: N/A  . Years of Education: N/A   Occupational History  . Not on file.   Social History Main Topics  . Smoking status: Former Research scientist (life sciences)  . Smokeless tobacco: Not on file  . Alcohol Use: No  . Drug Use: No  . Sexual Activity: Not on file   Other Topics Concern  . Not on file   Social History Narrative   Review of Systems  Eyes: Positive for redness and itching.  Skin: Positive for rash.  Neurological: Positive for headaches.      Objective:  BP 127/69 mmHg  Pulse 65  Temp(Src) 99.5 F (37.5 C) (Oral)  Resp 16  Ht 6' 0.75" (1.848 m)  Wt 210 lb 3.2 oz (95.346 kg)  BMI 27.92 kg/m2  SpO2 97% Physical Exam  Constitutional: He is oriented to person, place, and time. He appears well-developed and well-nourished. No distress.  HENT:  Head: Normocephalic and atraumatic.  Right Ear: Tympanic membrane, external ear and ear canal normal.  Left Ear: Tympanic membrane, external ear and ear canal normal.  Nose: No rhinorrhea.  Mouth/Throat: Oropharynx is clear and moist and mucous membranes are normal. No oropharyngeal exudate or posterior oropharyngeal erythema.  Slight edema of the turbinates but no active discharge. No sinus tenderness.  Eyes: Conjunctivae are normal. Pupils are equal, round, and reactive to light.  Neck: Normal range of motion. Neck supple.  Cardiovascular: Normal rate, regular rhythm, normal heart  sounds and intact distal pulses.   No murmur heard. Pulmonary/Chest: Effort normal and breath sounds normal. No respiratory distress. He has no wheezes. He has no rhonchi. He has no rales.  Abdominal: Soft. There is no tenderness.  Musculoskeletal: Normal range of motion.  Lymphadenopathy:    He has no cervical adenopathy.  Neurological: He is alert and oriented to person, place, and time.  Skin: Skin is warm and dry. No rash noted.  A 4 by 3 cm erythematous patch on upper middle chest excoriated, no surrounding erythema or vessels.  Psychiatric: He  has a normal mood and affect. His behavior is normal.  Nursing note and vitals reviewed.  POCT glucose (manual entry)  Result Value Ref Range   POC Glucose 225 (A) 70 - 99 mg/dl  POCT glycosylated hemoglobin (Hb A1C)  Result Value Ref Range   Hemoglobin A1C 6.9       Assessment & Plan:   Alfons Sulkowski is a 59 y.o. male Diabetes type 2, controlled - Plan: POCT glucose (manual entry), POCT glycosylated hemoglobin (Hb A1C)  - elevated in office, but overall controlled. No med changes at this time.   Hyperlipidemia - Plan: Lipid panel, Comprehensive metabolic panel  -labs pending. Continue Lipitor 10mg  qd.   Essential hypertension - Plan: hydrochlorothiazide (HYDRODIURIL) 25 MG tablet, metoprolol succinate (TOPROL-XL) 25 MG 24 hr tablet, lisinopril (PRINIVIL,ZESTRIL) 20 MG tablet  - stable. No med changes.   Gastroesophageal reflux disease, esophagitis presence not specified - Plan: pantoprazole (PROTONIX) 40 MG tablet  -stable - continue protonix.   Allergic rhinitis, unspecified allergic rhinitis type - Plan: fluticasone (FLONASE) 50 MCG/ACT nasal spray  - add flonase to daily allegra. Correct use discussed.   Eczema of hand, unspecified laterality - Plan: triamcinolone cream (KENALOG) 0.1 %  - dyshidrosis component.  Can start with eucerin and trianmcinolone, but stronger steroid may be needed. rtc precautions.   Contact dermatitis  - Plan: triamcinolone cream (KENALOG) 0.1 %  -may be from cologne. Has stopped use. Triamcinolone topical BID prn. rtc if persists/worsens.   Meds ordered this encounter  Medications  . metFORMIN (GLUCOPHAGE) 500 MG tablet    Sig: Take 2 tabs by mouth in morning with meal, take one tab by mouth in the evening with meal    Dispense:  135 tablet    Refill:  1  . hydrochlorothiazide (HYDRODIURIL) 25 MG tablet    Sig: Take 1 tablet (25 mg total) by mouth every morning.    Dispense:  90 tablet    Refill:  1  . pantoprazole (PROTONIX) 40 MG tablet    Sig: Take 1 tablet (40 mg total) by mouth daily.    Dispense:  90 tablet    Refill:  1  . metoprolol succinate (TOPROL-XL) 25 MG 24 hr tablet    Sig: Take 1 tablet (25 mg total) by mouth daily.    Dispense:  90 tablet    Refill:  1  . lisinopril (PRINIVIL,ZESTRIL) 20 MG tablet    Sig: Take 1 tablet (20 mg total) by mouth every morning.    Dispense:  90 tablet    Refill:  1  . fluticasone (FLONASE) 50 MCG/ACT nasal spray    Sig: Place 2 sprays into both nostrils daily.    Dispense:  16 g    Refill:  6  . triamcinolone cream (KENALOG) 0.1 %    Sig: Apply 1 application topically 2 (two) times daily.    Dispense:  30 g    Refill:  0   Patient Instructions  Don't take Meloxicam unless needed as long term use may increase risk of heart problems. Follow up with your orthopaedic doctor if you are still experiencing pain in your knees.   No change in your diabetes meds for now.   Your sinuses are due to allergies.  Ok to continue the allergy medicine fexofenadine each day, but also take flonase 2 sprays per day.  For rash on hands - eucerin and steroid cream twice per day. If this is not improved in next 2-3 weeks - let  me know.   Rash on chest may be due to cologne or other substance. See information below. Can apply steroid cream to that area as well.   Return to the clinic or go to the nearest emergency room if any of your symptoms  worsen or new symptoms occur.   Eczema Eczema, also called atopic dermatitis, is a skin disorder that causes inflammation of the skin. It causes a red rash and dry, scaly skin. The skin becomes very itchy. Eczema is generally worse during the cooler winter months and often improves with the warmth of summer. Eczema usually starts showing signs in infancy. Some children outgrow eczema, but it may last through adulthood.  CAUSES  The exact cause of eczema is not known, but it appears to run in families. People with eczema often have a family history of eczema, allergies, asthma, or hay fever. Eczema is not contagious. Flare-ups of the condition may be caused by:   Contact with something you are sensitive or allergic to.   Stress. SIGNS AND SYMPTOMS  Dry, scaly skin.   Red, itchy rash.   Itchiness. This may occur before the skin rash and may be very intense.  DIAGNOSIS  The diagnosis of eczema is usually made based on symptoms and medical history. TREATMENT  Eczema cannot be cured, but symptoms usually can be controlled with treatment and other strategies. A treatment plan might include:  Controlling the itching and scratching.   Use over-the-counter antihistamines as directed for itching. This is especially useful at night when the itching tends to be worse.   Use over-the-counter steroid creams as directed for itching.   Avoid scratching. Scratching makes the rash and itching worse. It may also result in a skin infection (impetigo) due to a break in the skin caused by scratching.   Keeping the skin well moisturized with creams every day. This will seal in moisture and help prevent dryness. Lotions that contain alcohol and water should be avoided because they can dry the skin.   Limiting exposure to things that you are sensitive or allergic to (allergens).   Recognizing situations that cause stress.   Developing a plan to manage stress.  HOME CARE INSTRUCTIONS    Only take over-the-counter or prescription medicines as directed by your health care provider.   Do not use anything on the skin without checking with your health care provider.   Keep baths or showers short (5 minutes) in warm (not hot) water. Use mild cleansers for bathing. These should be unscented. You may add nonperfumed bath oil to the bath water. It is best to avoid soap and bubble bath.   Immediately after a bath or shower, when the skin is still damp, apply a moisturizing ointment to the entire body. This ointment should be a petroleum ointment. This will seal in moisture and help prevent dryness. The thicker the ointment, the better. These should be unscented.   Keep fingernails cut short. Children with eczema may need to wear soft gloves or mittens at night after applying an ointment.   Dress in clothes made of cotton or cotton blends. Dress lightly, because heat increases itching.   A child with eczema should stay away from anyone with fever blisters or cold sores. The virus that causes fever blisters (herpes simplex) can cause a serious skin infection in children with eczema. SEEK MEDICAL CARE IF:   Your itching interferes with sleep.   Your rash gets worse or is not better within 1 week after starting  treatment.   You see pus or soft yellow scabs in the rash area.   You have a fever.   You have a rash flare-up after contact with someone who has fever blisters.  Document Released: 06/07/2000 Document Revised: 03/31/2013 Document Reviewed: 01/11/2013 [facility] Patient Information 2015 [place], [place]. This information is not intended to replace advice given to you by your health care provider. Make sure you discuss any questions you have with your health care provider. Contact Dermatitis Contact dermatitis is a reaction to certain substances that touch the skin. Contact dermatitis can be either irritant contact dermatitis or allergic contact dermatitis. Irritant  contact dermatitis does not require previous exposure to the substance for a reaction to occur.Allergic contact dermatitis only occurs if you have been exposed to the substance before. Upon a repeat exposure, your body reacts to the substance.  CAUSES  Many substances can cause contact dermatitis. Irritant dermatitis is most commonly caused by repeated exposure to mildly irritating substances, such as:  Makeup.  Soaps.  Detergents.  Bleaches.  Acids.  Metal salts, such as nickel. Allergic contact dermatitis is most commonly caused by exposure to:  Poisonous plants.  Chemicals (deodorants, shampoos).  Jewelry.  Latex.  Neomycin in triple antibiotic cream.  Preservatives in products, including clothing. SYMPTOMS  The area of skin that is exposed may develop:  Dryness or flaking.  Redness.  Cracks.  Itching.  Pain or a burning sensation.  Blisters. With allergic contact dermatitis, there may also be swelling in areas such as the eyelids, mouth, or genitals.  DIAGNOSIS  Your caregiver can usually tell what the problem is by doing a physical exam. In cases where the cause is uncertain and an allergic contact dermatitis is suspected, a patch skin test may be performed to help determine the cause of your dermatitis. TREATMENT Treatment includes protecting the skin from further contact with the irritating substance by avoiding that substance if possible. Barrier creams, powders, and gloves may be helpful. Your caregiver may also recommend:  Steroid creams or ointments applied 2 times daily. For best results, soak the rash area in cool water for 20 minutes. Then apply the medicine. Cover the area with a plastic wrap. You can store the steroid cream in the refrigerator for a "chilly" effect on your rash. That may decrease itching. Oral steroid medicines may be needed in more severe cases.  Antibiotics or antibacterial ointments if a skin infection is present.  Antihistamine  lotion or an antihistamine taken by mouth to ease itching.  Lubricants to keep moisture in your skin.  Burow's solution to reduce redness and soreness or to dry a weeping rash. Mix one packet or tablet of solution in 2 cups cool water. Dip a clean washcloth in the mixture, wring it out a bit, and put it on the affected area. Leave the cloth in place for 30 minutes. Do this as often as possible throughout the day.  Taking several cornstarch or baking soda baths daily if the area is too large to cover with a washcloth. Harsh chemicals, such as alkalis or acids, can cause skin damage that is like a burn. You should flush your skin for 15 to 20 minutes with cold water after such an exposure. You should also seek immediate medical care after exposure. Bandages (dressings), antibiotics, and pain medicine may be needed for severely irritated skin.  HOME CARE INSTRUCTIONS  Avoid the substance that caused your reaction.  Keep the area of skin that is affected away from hot  water, soap, sunlight, chemicals, acidic substances, or anything else that would irritate your skin.  Do not scratch the rash. Scratching may cause the rash to become infected.  You may take cool baths to help stop the itching.  Only take over-the-counter or prescription medicines as directed by your caregiver.  See your caregiver for follow-up care as directed to make sure your skin is healing properly. SEEK MEDICAL CARE IF:   Your condition is not better after 3 days of treatment.  You seem to be getting worse.  You see signs of infection such as swelling, tenderness, redness, soreness, or warmth in the affected area.  You have any problems related to your medicines. Document Released: 06/07/2000 Document Revised: 09/02/2011 Document Reviewed: 11/13/2010 [facility] Patient Information 2015 [place], [place]. This information is not intended to replace advice given to you by your health care provider. Make sure you discuss any  questions you have with your health care provider. Allergic Rhinitis Allergic rhinitis is when the mucous membranes in the nose respond to allergens. Allergens are particles in the air that cause your body to have an allergic reaction. This causes you to release allergic antibodies. Through a chain of events, these eventually cause you to release histamine into the blood stream. Although meant to protect the body, it is this release of histamine that causes your discomfort, such as frequent sneezing, congestion, and an itchy, runny nose.  CAUSES  Seasonal allergic rhinitis (hay fever) is caused by pollen allergens that may come from grasses, trees, and weeds. Year-round allergic rhinitis (perennial allergic rhinitis) is caused by allergens such as house dust mites, pet dander, and mold spores.  SYMPTOMS   Nasal stuffiness (congestion).  Itchy, runny nose with sneezing and tearing of the eyes. DIAGNOSIS  Your health care provider can help you determine the allergen or allergens that trigger your symptoms. If you and your health care provider are unable to determine the allergen, skin or blood testing may be used. TREATMENT  Allergic rhinitis does not have a cure, but it can be controlled by:  Medicines and allergy shots (immunotherapy).  Avoiding the allergen. Hay fever may often be treated with antihistamines in pill or nasal spray forms. Antihistamines block the effects of histamine. There are over-the-counter medicines that may help with nasal congestion and swelling around the eyes. Check with your health care provider before taking or giving this medicine.  If avoiding the allergen or the medicine prescribed do not work, there are many new medicines your health care provider can prescribe. Stronger medicine may be used if initial measures are ineffective. Desensitizing injections can be used if medicine and avoidance does not work. Desensitization is when a patient is given ongoing shots until  the body becomes less sensitive to the allergen. Make sure you follow up with your health care provider if problems continue. HOME CARE INSTRUCTIONS It is not possible to completely avoid allergens, but you can reduce your symptoms by taking steps to limit your exposure to them. It helps to know exactly what you are allergic to so that you can avoid your specific triggers. SEEK MEDICAL CARE IF:   You have a fever.  You develop a cough that does not stop easily (persistent).  You have shortness of breath.  You start wheezing.  Symptoms interfere with normal daily activities. Document Released: 03/05/2001 Document Revised: 06/15/2013 Document Reviewed: 02/15/2013 [facility] Patient Information 2015 [place], [place]. This information is not intended to replace advice given to you by your health care  provider. Make sure you discuss any questions you have with your health care provider.        I personally performed the services described in this documentation, which was scribed in my presence. The recorded information has been reviewed and considered, and addended by me as needed.

## 2014-10-17 NOTE — Patient Instructions (Addendum)
Don't take Meloxicam unless needed as long term use may increase risk of heart problems. Follow up with your orthopaedic doctor if you are still experiencing pain in your knees.   No change in your diabetes meds for now.   Your sinuses are due to allergies.  Ok to continue the allergy medicine fexofenadine each day, but also take flonase 2 sprays per day.  For rash on hands - eucerin and steroid cream twice per day. If this is not improved in next 2-3 weeks - let me know.   Rash on chest may be due to cologne or other substance. See information below. Can apply steroid cream to that area as well.   Return to the clinic or go to the nearest emergency room if any of your symptoms worsen or new symptoms occur.   Eczema Eczema, also called atopic dermatitis, is a skin disorder that causes inflammation of the skin. It causes a red rash and dry, scaly skin. The skin becomes very itchy. Eczema is generally worse during the cooler winter months and often improves with the warmth of summer. Eczema usually starts showing signs in infancy. Some children outgrow eczema, but it may last through adulthood.  CAUSES  The exact cause of eczema is not known, but it appears to run in families. People with eczema often have a family history of eczema, allergies, asthma, or hay fever. Eczema is not contagious. Flare-ups of the condition may be caused by:   Contact with something you are sensitive or allergic to.   Stress. SIGNS AND SYMPTOMS  Dry, scaly skin.   Red, itchy rash.   Itchiness. This may occur before the skin rash and may be very intense.  DIAGNOSIS  The diagnosis of eczema is usually made based on symptoms and medical history. TREATMENT  Eczema cannot be cured, but symptoms usually can be controlled with treatment and other strategies. A treatment plan might include:  Controlling the itching and scratching.   Use over-the-counter antihistamines as directed for itching. This is  especially useful at night when the itching tends to be worse.   Use over-the-counter steroid creams as directed for itching.   Avoid scratching. Scratching makes the rash and itching worse. It may also result in a skin infection (impetigo) due to a break in the skin caused by scratching.   Keeping the skin well moisturized with creams every day. This will seal in moisture and help prevent dryness. Lotions that contain alcohol and water should be avoided because they can dry the skin.   Limiting exposure to things that you are sensitive or allergic to (allergens).   Recognizing situations that cause stress.   Developing a plan to manage stress.  HOME CARE INSTRUCTIONS   Only take over-the-counter or prescription medicines as directed by your health care provider.   Do not use anything on the skin without checking with your health care provider.   Keep baths or showers short (5 minutes) in warm (not hot) water. Use mild cleansers for bathing. These should be unscented. You may add nonperfumed bath oil to the bath water. It is best to avoid soap and bubble bath.   Immediately after a bath or shower, when the skin is still damp, apply a moisturizing ointment to the entire body. This ointment should be a petroleum ointment. This will seal in moisture and help prevent dryness. The thicker the ointment, the better. These should be unscented.   Keep fingernails cut short. Children with eczema may need to  wear soft gloves or mittens at night after applying an ointment.   Dress in clothes made of cotton or cotton blends. Dress lightly, because heat increases itching.   A child with eczema should stay away from anyone with fever blisters or cold sores. The virus that causes fever blisters (herpes simplex) can cause a serious skin infection in children with eczema. SEEK MEDICAL CARE IF:   Your itching interferes with sleep.   Your rash gets worse or is not better within 1 week  after starting treatment.   You see pus or soft yellow scabs in the rash area.   You have a fever.   You have a rash flare-up after contact with someone who has fever blisters.  Document Released: 06/07/2000 Document Revised: 03/31/2013 Document Reviewed: 01/11/2013 [facility] Patient Information 2015 [place], [place]. This information is not intended to replace advice given to you by your health care provider. Make sure you discuss any questions you have with your health care provider. Contact Dermatitis Contact dermatitis is a reaction to certain substances that touch the skin. Contact dermatitis can be either irritant contact dermatitis or allergic contact dermatitis. Irritant contact dermatitis does not require previous exposure to the substance for a reaction to occur.Allergic contact dermatitis only occurs if you have been exposed to the substance before. Upon a repeat exposure, your body reacts to the substance.  CAUSES  Many substances can cause contact dermatitis. Irritant dermatitis is most commonly caused by repeated exposure to mildly irritating substances, such as:  Makeup.  Soaps.  Detergents.  Bleaches.  Acids.  Metal salts, such as nickel. Allergic contact dermatitis is most commonly caused by exposure to:  Poisonous plants.  Chemicals (deodorants, shampoos).  Jewelry.  Latex.  Neomycin in triple antibiotic cream.  Preservatives in products, including clothing. SYMPTOMS  The area of skin that is exposed may develop:  Dryness or flaking.  Redness.  Cracks.  Itching.  Pain or a burning sensation.  Blisters. With allergic contact dermatitis, there may also be swelling in areas such as the eyelids, mouth, or genitals.  DIAGNOSIS  Your caregiver can usually tell what the problem is by doing a physical exam. In cases where the cause is uncertain and an allergic contact dermatitis is suspected, a patch skin test may be performed to help determine the  cause of your dermatitis. TREATMENT Treatment includes protecting the skin from further contact with the irritating substance by avoiding that substance if possible. Barrier creams, powders, and gloves may be helpful. Your caregiver may also recommend:  Steroid creams or ointments applied 2 times daily. For best results, soak the rash area in cool water for 20 minutes. Then apply the medicine. Cover the area with a plastic wrap. You can store the steroid cream in the refrigerator for a "chilly" effect on your rash. That may decrease itching. Oral steroid medicines may be needed in more severe cases.  Antibiotics or antibacterial ointments if a skin infection is present.  Antihistamine lotion or an antihistamine taken by mouth to ease itching.  Lubricants to keep moisture in your skin.  Burow's solution to reduce redness and soreness or to dry a weeping rash. Mix one packet or tablet of solution in 2 cups cool water. Dip a clean washcloth in the mixture, wring it out a bit, and put it on the affected area. Leave the cloth in place for 30 minutes. Do this as often as possible throughout the day.  Taking several cornstarch or baking soda baths daily if  the area is too large to cover with a washcloth. Harsh chemicals, such as alkalis or acids, can cause skin damage that is like a burn. You should flush your skin for 15 to 20 minutes with cold water after such an exposure. You should also seek immediate medical care after exposure. Bandages (dressings), antibiotics, and pain medicine may be needed for severely irritated skin.  HOME CARE INSTRUCTIONS  Avoid the substance that caused your reaction.  Keep the area of skin that is affected away from hot water, soap, sunlight, chemicals, acidic substances, or anything else that would irritate your skin.  Do not scratch the rash. Scratching may cause the rash to become infected.  You may take cool baths to help stop the itching.  Only take  over-the-counter or prescription medicines as directed by your caregiver.  See your caregiver for follow-up care as directed to make sure your skin is healing properly. SEEK MEDICAL CARE IF:   Your condition is not better after 3 days of treatment.  You seem to be getting worse.  You see signs of infection such as swelling, tenderness, redness, soreness, or warmth in the affected area.  You have any problems related to your medicines. Document Released: 06/07/2000 Document Revised: 09/02/2011 Document Reviewed: 11/13/2010 [facility] Patient Information 2015 [place], [place]. This information is not intended to replace advice given to you by your health care provider. Make sure you discuss any questions you have with your health care provider. Allergic Rhinitis Allergic rhinitis is when the mucous membranes in the nose respond to allergens. Allergens are particles in the air that cause your body to have an allergic reaction. This causes you to release allergic antibodies. Through a chain of events, these eventually cause you to release histamine into the blood stream. Although meant to protect the body, it is this release of histamine that causes your discomfort, such as frequent sneezing, congestion, and an itchy, runny nose.  CAUSES  Seasonal allergic rhinitis (hay fever) is caused by pollen allergens that may come from grasses, trees, and weeds. Year-round allergic rhinitis (perennial allergic rhinitis) is caused by allergens such as house dust mites, pet dander, and mold spores.  SYMPTOMS   Nasal stuffiness (congestion).  Itchy, runny nose with sneezing and tearing of the eyes. DIAGNOSIS  Your health care provider can help you determine the allergen or allergens that trigger your symptoms. If you and your health care provider are unable to determine the allergen, skin or blood testing may be used. TREATMENT  Allergic rhinitis does not have a cure, but it can be controlled by:  Medicines  and allergy shots (immunotherapy).  Avoiding the allergen. Hay fever may often be treated with antihistamines in pill or nasal spray forms. Antihistamines block the effects of histamine. There are over-the-counter medicines that may help with nasal congestion and swelling around the eyes. Check with your health care provider before taking or giving this medicine.  If avoiding the allergen or the medicine prescribed do not work, there are many new medicines your health care provider can prescribe. Stronger medicine may be used if initial measures are ineffective. Desensitizing injections can be used if medicine and avoidance does not work. Desensitization is when a patient is given ongoing shots until the body becomes less sensitive to the allergen. Make sure you follow up with your health care provider if problems continue. HOME CARE INSTRUCTIONS It is not possible to completely avoid allergens, but you can reduce your symptoms by taking steps to limit your  exposure to them. It helps to know exactly what you are allergic to so that you can avoid your specific triggers. SEEK MEDICAL CARE IF:   You have a fever.  You develop a cough that does not stop easily (persistent).  You have shortness of breath.  You start wheezing.  Symptoms interfere with normal daily activities. Document Released: 03/05/2001 Document Revised: 06/15/2013 Document Reviewed: 02/15/2013 [facility] Patient Information 2015 [place], [place]. This information is not intended to replace advice given to you by your health care provider. Make sure you discuss any questions you have with your health care provider.

## 2014-10-19 DIAGNOSIS — L309 Dermatitis, unspecified: Secondary | ICD-10-CM | POA: Insufficient documentation

## 2015-01-29 ENCOUNTER — Emergency Department (HOSPITAL_COMMUNITY)
Admission: EM | Admit: 2015-01-29 | Discharge: 2015-01-29 | Disposition: A | Discharge status: Home / Self Care | Payer: BLUE CROSS/BLUE SHIELD | Attending: Emergency Medicine | Admitting: Emergency Medicine

## 2015-01-29 ENCOUNTER — Emergency Department (HOSPITAL_COMMUNITY): Payer: BLUE CROSS/BLUE SHIELD

## 2015-01-29 DIAGNOSIS — I10 Essential (primary) hypertension: Secondary | ICD-10-CM | POA: Diagnosis not present

## 2015-01-29 DIAGNOSIS — Z7952 Long term (current) use of systemic steroids: Secondary | ICD-10-CM | POA: Diagnosis not present

## 2015-01-29 DIAGNOSIS — Y998 Other external cause status: Secondary | ICD-10-CM | POA: Diagnosis not present

## 2015-01-29 DIAGNOSIS — M1990 Unspecified osteoarthritis, unspecified site: Secondary | ICD-10-CM | POA: Insufficient documentation

## 2015-01-29 DIAGNOSIS — S39012A Strain of muscle, fascia and tendon of lower back, initial encounter: Secondary | ICD-10-CM | POA: Insufficient documentation

## 2015-01-29 DIAGNOSIS — X58XXXA Exposure to other specified factors, initial encounter: Secondary | ICD-10-CM | POA: Insufficient documentation

## 2015-01-29 DIAGNOSIS — Z7982 Long term (current) use of aspirin: Secondary | ICD-10-CM | POA: Diagnosis not present

## 2015-01-29 DIAGNOSIS — Z79899 Other long term (current) drug therapy: Secondary | ICD-10-CM | POA: Insufficient documentation

## 2015-01-29 DIAGNOSIS — E785 Hyperlipidemia, unspecified: Secondary | ICD-10-CM | POA: Insufficient documentation

## 2015-01-29 DIAGNOSIS — Y9289 Other specified places as the place of occurrence of the external cause: Secondary | ICD-10-CM | POA: Diagnosis not present

## 2015-01-29 DIAGNOSIS — Z791 Long term (current) use of non-steroidal anti-inflammatories (NSAID): Secondary | ICD-10-CM | POA: Insufficient documentation

## 2015-01-29 DIAGNOSIS — Y9389 Activity, other specified: Secondary | ICD-10-CM | POA: Diagnosis not present

## 2015-01-29 DIAGNOSIS — Z8719 Personal history of other diseases of the digestive system: Secondary | ICD-10-CM | POA: Diagnosis not present

## 2015-01-29 DIAGNOSIS — S3992XA Unspecified injury of lower back, initial encounter: Secondary | ICD-10-CM | POA: Diagnosis present

## 2015-01-29 DIAGNOSIS — Z87891 Personal history of nicotine dependence: Secondary | ICD-10-CM | POA: Insufficient documentation

## 2015-01-29 DIAGNOSIS — E119 Type 2 diabetes mellitus without complications: Secondary | ICD-10-CM | POA: Diagnosis not present

## 2015-01-29 DIAGNOSIS — Z7951 Long term (current) use of inhaled steroids: Secondary | ICD-10-CM | POA: Insufficient documentation

## 2015-01-29 MED ORDER — OXYCODONE-ACETAMINOPHEN 5-325 MG PO TABS
1.0000 | ORAL_TABLET | Freq: Once | ORAL | Status: AC
  Administered 2015-01-29: 1 via ORAL
  Filled 2015-01-29: qty 1

## 2015-01-29 MED ORDER — PREDNISONE 20 MG PO TABS: 20.0000 mg | ORAL_TABLET | Freq: Every day | ORAL | Status: DC

## 2015-01-29 MED ORDER — KETOROLAC TROMETHAMINE 60 MG/2ML IM SOLN
60.0000 mg | Freq: Once | INTRAMUSCULAR | Status: AC
  Administered 2015-01-29: 60 mg via INTRAMUSCULAR
  Filled 2015-01-29: qty 2

## 2015-01-29 MED ORDER — CYCLOBENZAPRINE HCL 10 MG PO TABS: 10.0000 mg | ORAL_TABLET | Freq: Three times a day (TID) | ORAL | Status: DC | PRN

## 2015-01-29 MED ORDER — HYDROCODONE-ACETAMINOPHEN 5-325 MG PO TABS: 1.0000 | ORAL_TABLET | Freq: Four times a day (QID) | ORAL | Status: DC | PRN

## 2015-01-29 NOTE — ED Provider Notes (Signed)
CSN: [id number]     Arrival date & time 01/29/15  2056 History  This chart was scribed for non-physician practitioner Irena Cords, PA-C working with Forde Dandy, MD by Ludger Nutting, ED Scribe. This patient was seen in room [place] and the patient's care was started at 9:21 PM.    Chief Complaint  Patient presents with  . Hip Pain    The history is provided by the patient. No language interpreter was used.     HPI Comments: Brendan Gonzales is a 59 y.o. male who presents to the Emergency Department complaining of sudden onset, constant, unchanged, non-radiating left lower lateral back pain that began earlier today. He denies known falls or trauma to the affected area. He has not tried any home remedies for his symptoms. He denies alleviating or aggravating factors. He denies numbness, weakness, bowel/bladder incontinence, paresthesias.   Past Medical History  Diagnosis Date  . Hypertension     benign  . Diabetes mellitus     type 2  . Arthritis   . Headache(784.0)   . Hyperlipidemia   . Constipation   . Dizziness    Past Surgical History  Procedure Laterality Date  . Colonoscopy    . Right femeroral bypass    . Right knee surgery    . Coronary angioplasty    . Hemorrhoid surgery     Family History  Problem Relation Age of Onset  . Anesthesia problems Neg Hx   . Hypotension Neg Hx   . Malignant hyperthermia Neg Hx   . Pseudochol deficiency Neg Hx   . Diabetes Other   . Hypertension Other    History  Substance Use Topics  . Smoking status: Former Research scientist (life sciences)  . Smokeless tobacco: Not on file  . Alcohol Use: No    Review of Systems  Musculoskeletal: Positive for myalgias and back pain. Negative for gait problem.  Neurological: Negative for weakness and numbness.      Allergies  Review of patient's allergies indicates no known allergies.  Home Medications   Prior to Admission medications   Medication Sig Start Date End Date Taking? Authorizing Provider   acetaminophen (TYLENOL) 500 MG tablet Take 500 mg by mouth every 6 (six) hours as needed.    Historical Provider, MD  aspirin 81 MG tablet Take 81 mg by mouth every morning.     Historical Provider, MD  atorvastatin (LIPITOR) 10 MG tablet Take 1 tablet (10 mg total) by mouth daily. 07/18/14   [name], MD  Cyanocobalamin (VITAMIN B 12 PO) Take 1,000 mg by mouth daily.    Historical Provider, MD  fluticasone (FLONASE) 50 MCG/ACT nasal spray Place 2 sprays into both nostrils daily. 10/17/14   [name], MD  GARLIC PO Take 1 capsule by mouth every morning.    Historical Provider, MD  hydrochlorothiazide (HYDRODIURIL) 25 MG tablet Take 1 tablet (25 mg total) by mouth every morning. 10/17/14   [name], MD  lisinopril (PRINIVIL,ZESTRIL) 20 MG tablet TAKE ONE TABLET BY MOUTH IN THE MORNING 03/07/14   [name], MD  lisinopril (PRINIVIL,ZESTRIL) 20 MG tablet Take 1 tablet (20 mg total) by mouth every morning. 10/17/14   [name], MD  meloxicam (MOBIC) 7.5 MG tablet TAKE ONE TABLET BY MOUTH ONCE DAILY 08/05/14   [name], MD  metFORMIN (GLUCOPHAGE) 500 MG tablet Take 2 tabs by mouth in morning with meal, take one tab by mouth in the evening with meal 10/17/14  [name], MD  metoprolol succinate (TOPROL-XL) 25 MG 24 hr tablet Take 1 tablet (25 mg total) by mouth daily. 10/17/14   [name], MD  Multiple Vitamins-Minerals (MENS 50+ MULTI VITAMIN/MIN PO) Take by mouth daily.    Historical Provider, MD  pantoprazole (PROTONIX) 40 MG tablet Take 1 tablet (40 mg total) by mouth daily. 10/17/14   [name], MD  triamcinolone cream (KENALOG) 0.1 % Apply 1 application topically 2 (two) times daily. 10/17/14   [name], MD   BP 139/90 mmHg  Pulse 78  Temp(Src) 98.7 F (37.1 C) (Oral)  Resp 18  Ht 6\' 1"  (1.854 m)  Wt 204 lb (92.534 kg)  BMI 26.92 kg/m2  SpO2 98% Physical Exam  Constitutional: He is oriented to person, place, and time. He  appears well-developed and well-nourished.  HENT:  Head: Normocephalic and atraumatic.  Cardiovascular: Normal rate, regular rhythm and normal heart sounds.   Pulmonary/Chest: Effort normal and breath sounds normal. No respiratory distress.  Musculoskeletal: Normal range of motion. He exhibits tenderness.       Back:  Neurological: He is alert and oriented to person, place, and time. He exhibits normal muscle tone. Coordination normal.  Good strength and sensation in lower extremities.   Skin: Skin is warm and dry.  Psychiatric: He has a normal mood and affect.  Nursing note and vitals reviewed.   ED Course  Procedures (including critical care time)  DIAGNOSTIC STUDIES: Oxygen Saturation is 98% on RA, normal by my interpretation.    COORDINATION OF CARE: 9:25 PM Will order imaging of the lumbar spine and pain management. Discussed treatment plan with pt at bedside and pt agreed to plan.    Imaging Review No results found. Patient be treated for lumbar strain.  Told to return here as needed.  Told to follow with his primary care doctor I personally performed the services described in this documentation, which was scribed in my presence. The recorded information has been reviewed and is accurate.   Dalia Heading, PA-C 01/29/15 [place], PA-C 01/29/15 [id number]  Forde Dandy, MD 01/30/15 [phone]

## 2015-01-29 NOTE — ED Notes (Signed)
Patient was lifting chairs and starting having hip pain 3 hours ago. Pain is in knee. Pain is on both sides.

## 2015-01-29 NOTE — Discharge Instructions (Signed)
Return here as needed.  Follow up with her primary care doctor.  Your x-ray showed you have some arthritis in your lower back, which could contribute to this condition

## 2015-03-17 ENCOUNTER — Emergency Department (HOSPITAL_COMMUNITY): Payer: BLUE CROSS/BLUE SHIELD

## 2015-03-17 ENCOUNTER — Encounter (HOSPITAL_COMMUNITY): Payer: Self-pay | Admitting: Emergency Medicine

## 2015-03-17 ENCOUNTER — Emergency Department (HOSPITAL_COMMUNITY)
Admission: EM | Admit: 2015-03-17 | Discharge: 2015-03-17 | Disposition: A | Discharge status: Home / Self Care | Payer: BLUE CROSS/BLUE SHIELD | Attending: Emergency Medicine | Admitting: Emergency Medicine

## 2015-03-17 DIAGNOSIS — Z7952 Long term (current) use of systemic steroids: Secondary | ICD-10-CM | POA: Insufficient documentation

## 2015-03-17 DIAGNOSIS — I10 Essential (primary) hypertension: Secondary | ICD-10-CM | POA: Diagnosis not present

## 2015-03-17 DIAGNOSIS — Y9389 Activity, other specified: Secondary | ICD-10-CM | POA: Diagnosis not present

## 2015-03-17 DIAGNOSIS — E785 Hyperlipidemia, unspecified: Secondary | ICD-10-CM | POA: Insufficient documentation

## 2015-03-17 DIAGNOSIS — Z87891 Personal history of nicotine dependence: Secondary | ICD-10-CM | POA: Diagnosis not present

## 2015-03-17 DIAGNOSIS — Z9861 Coronary angioplasty status: Secondary | ICD-10-CM | POA: Insufficient documentation

## 2015-03-17 DIAGNOSIS — T65891A Toxic effect of other specified substances, accidental (unintentional), initial encounter: Secondary | ICD-10-CM | POA: Diagnosis not present

## 2015-03-17 DIAGNOSIS — M1990 Unspecified osteoarthritis, unspecified site: Secondary | ICD-10-CM | POA: Diagnosis not present

## 2015-03-17 DIAGNOSIS — R109 Unspecified abdominal pain: Secondary | ICD-10-CM | POA: Diagnosis not present

## 2015-03-17 DIAGNOSIS — Z79899 Other long term (current) drug therapy: Secondary | ICD-10-CM | POA: Diagnosis not present

## 2015-03-17 DIAGNOSIS — Z7982 Long term (current) use of aspirin: Secondary | ICD-10-CM | POA: Diagnosis not present

## 2015-03-17 DIAGNOSIS — Y998 Other external cause status: Secondary | ICD-10-CM | POA: Diagnosis not present

## 2015-03-17 DIAGNOSIS — E119 Type 2 diabetes mellitus without complications: Secondary | ICD-10-CM | POA: Insufficient documentation

## 2015-03-17 DIAGNOSIS — Z7951 Long term (current) use of inhaled steroids: Secondary | ICD-10-CM | POA: Insufficient documentation

## 2015-03-17 DIAGNOSIS — Y9289 Other specified places as the place of occurrence of the external cause: Secondary | ICD-10-CM | POA: Diagnosis not present

## 2015-03-17 DIAGNOSIS — R079 Chest pain, unspecified: Secondary | ICD-10-CM

## 2015-03-17 DIAGNOSIS — T59891A Toxic effect of other specified gases, fumes and vapors, accidental (unintentional), initial encounter: Secondary | ICD-10-CM

## 2015-03-17 LAB — CBC
HCT: 36.2 % — ABNORMAL LOW (ref 39.0–52.0)
Hemoglobin: 12.3 g/dL — ABNORMAL LOW (ref 13.0–17.0)
MCH: 28 pg (ref 26.0–34.0)
MCHC: 34 g/dL (ref 30.0–36.0)
MCV: 82.5 fL (ref 78.0–100.0)
PLATELETS: 240 10*3/uL (ref 150–400)
RBC: 4.39 MIL/uL (ref 4.22–5.81)
RDW: 13 % (ref 11.5–15.5)
WBC: 4.8 10*3/uL (ref 4.0–10.5)

## 2015-03-17 LAB — BASIC METABOLIC PANEL
ANION GAP: 10 (ref 5–15)
BUN: 9 mg/dL (ref 6–20)
CALCIUM: 9.1 mg/dL (ref 8.9–10.3)
CO2: 27 mmol/L (ref 22–32)
CREATININE: 0.57 mg/dL — AB (ref 0.61–1.24)
Chloride: 101 mmol/L (ref 101–111)
Glucose, Bld: 137 mg/dL — ABNORMAL HIGH (ref 65–99)
Potassium: 3.5 mmol/L (ref 3.5–5.1)
SODIUM: 138 mmol/L (ref 135–145)

## 2015-03-17 LAB — I-STAT TROPONIN, ED
Troponin i, poc: 0 ng/mL (ref 0.00–0.08)
Troponin i, poc: 0 ng/mL (ref 0.00–0.08)

## 2015-03-17 MED ORDER — ASPIRIN 81 MG PO CHEW
324.0000 mg | CHEWABLE_TABLET | Freq: Once | ORAL | Status: AC
  Administered 2015-03-17: 243 mg via ORAL
  Filled 2015-03-17: qty 4

## 2015-03-17 MED ORDER — ONDANSETRON 4 MG PO TBDP
4.0000 mg | ORAL_TABLET | Freq: Once | ORAL | Status: AC
  Administered 2015-03-17: 4 mg via ORAL
  Filled 2015-03-17: qty 1

## 2015-03-17 NOTE — Discharge Instructions (Signed)

## 2015-03-17 NOTE — ED Notes (Signed)
Patient c/o diarrhea, nausea onset yesterday, chest pressure and pain today across central chest and left chest., along with difficulty swallowing due to central pain through esophageal area.

## 2015-03-17 NOTE — ED Provider Notes (Signed)
CSN: [id number]     Arrival date & time 03/17/15  1214 History   First MD Initiated Contact with Patient 03/17/15 1306     Chief Complaint  Patient presents with  . Chest Pain     (Consider location/radiation/quality/duration/timing/severity/associated sxs/prior Treatment) HPI Comments: 59 year old male with hypertension, hyperlipidemia, type 2 diabetes mellitus who presents with chest pain. 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 While he was training around the chemicals, he began coughing and had burning of his eyes and developed central to left-sided chest pain as well as headache. The person training him was asymptomatic. He endorses nausea and one episode of diarrhea yesterday. Today he has continued to have intermittent central to left-sided chest pain that he describes as a heaviness sensation. He endorses some low back pain as well as some difficulty swallowing. He denies any shortness of breath. No fevers or recent illness. He is not sure what chemicals were involved in the machine.  Patient is a 59 y.o. male presenting with chest pain. The history is provided by the patient.  Chest Pain   Past Medical History  Diagnosis Date  . Hypertension     benign  . Diabetes mellitus     type 2  . Arthritis   . Headache(784.0)   . Hyperlipidemia   . Constipation   . Dizziness    Past Surgical History  Procedure Laterality Date  . Colonoscopy    . Right femeroral bypass    . Right knee surgery    . Coronary angioplasty    . Hemorrhoid surgery     Family History  Problem Relation Age of Onset  . Anesthesia problems Neg Hx   . Hypotension Neg Hx   . Malignant hyperthermia Neg Hx   . Pseudochol deficiency Neg Hx   . Diabetes Other   . Hypertension Other    Social History  Substance Use Topics  . Smoking status: Former Research scientist (life sciences)  . Smokeless tobacco: None  . Alcohol Use: No     Review of Systems  Cardiovascular: Positive for chest pain.    10 Systems reviewed and are negative for acute change except as noted in the HPI.   Allergies  Review of patient's allergies indicates no known allergies.  Home Medications   Prior to Admission medications   Medication Sig Start Date End Date Taking? Authorizing Sampson Self  acetaminophen (TYLENOL) 500 MG tablet Take 500 mg by mouth every 6 (six) hours as needed for mild pain.    Yes Historical Flora Ratz, MD  aspirin 81 MG tablet Take 81 mg by mouth every morning.    Yes Historical Derry Arbogast, MD  atorvastatin (LIPITOR) 10 MG tablet Take 1 tablet (10 mg total) by mouth daily. 07/18/14  Yes [name], MD  Cyanocobalamin (VITAMIN B 12 PO) Take 1,000 mg by mouth daily.   Yes Historical Keyonta Barradas, MD  cyclobenzaprine (FLEXERIL) 10 MG tablet Take 1 tablet (10 mg total) by mouth 3 (three) times daily as needed for muscle spasms. 01/29/15  Yes Christopher Lawyer, PA-C  fluticasone (FLONASE) 50 MCG/ACT nasal spray Place 2 sprays into both nostrils daily. Patient taking differently: Place 2 sprays into both nostrils daily as needed for allergies.  10/17/14  Yes [name], MD  GARLIC PO Take 1 capsule by mouth every morning.   Yes Historical Macoy Rodwell, MD  hydrochlorothiazide (HYDRODIURIL) 25 MG tablet Take 1 tablet (  25 mg total) by mouth every morning. 10/17/14  Yes [name], MD  HYDROcodone-acetaminophen (NORCO/VICODIN) 5-325 MG per tablet Take 1 tablet by mouth every 6 (six) hours as needed for moderate pain. 01/29/15  Yes Christopher Lawyer, PA-C  lisinopril (PRINIVIL,ZESTRIL) 20 MG tablet Take 1 tablet (20 mg total) by mouth every morning. 10/17/14  Yes [name], MD  metFORMIN (GLUCOPHAGE) 500 MG tablet Take 2 tabs by mouth in morning with meal, take one tab by mouth in the evening with meal Patient taking differently: Take 500-1,000 mg by mouth 2 (two) times daily with a meal. Take 2 tabs by mouth in morning with  meal, take one tab by mouth in the evening with meal 10/17/14  Yes [name], MD  metoprolol succinate (TOPROL-XL) 25 MG 24 hr tablet Take 1 tablet (25 mg total) by mouth daily. 10/17/14  Yes [name], MD  Multiple Vitamin (MULTIVITAMIN WITH MINERALS) TABS tablet Take 1 tablet by mouth daily.   Yes Historical Kaylanni Ezelle, MD  pantoprazole (PROTONIX) 40 MG tablet Take 1 tablet (40 mg total) by mouth daily. 10/17/14  Yes [name], MD  triamcinolone cream (KENALOG) 0.1 % Apply 1 application topically 2 (two) times daily. 10/17/14  Yes [name], MD  lisinopril (PRINIVIL,ZESTRIL) 20 MG tablet TAKE ONE TABLET BY MOUTH IN THE MORNING Patient not taking: Reported on 01/29/2015 03/07/14   [name], MD  meloxicam (MOBIC) 7.5 MG tablet TAKE ONE TABLET BY MOUTH ONCE DAILY Patient not taking: Reported on 01/29/2015 08/05/14   [name], MD  predniSONE (DELTASONE) 20 MG tablet Take 1 tablet (20 mg total) by mouth daily. Patient not taking: Reported on 03/17/2015 01/29/15   Dalia Heading, PA-C   BP 135/79 mmHg  Pulse 90  Temp(Src) 98.5 F (36.9 C) (Oral)  Resp 12  Ht 6\' 1"  (1.854 m)  Wt 202 lb (91.627 kg)  BMI 26.66 kg/m2  SpO2 98% Physical Exam  Constitutional: He is oriented to person, place, and time. He appears well-developed and well-nourished. No distress.  HENT:  Head: Normocephalic and atraumatic.  Mouth/Throat: Oropharynx is clear and moist.  Moist mucous membranes  Eyes: Conjunctivae are normal. Pupils are equal, round, and reactive to light.  Neck: Neck supple.  Cardiovascular: Normal rate, regular rhythm and normal heart sounds.   No murmur heard. Pulmonary/Chest: Effort normal and breath sounds normal.  Abdominal: Soft. Bowel sounds are normal. He exhibits no distension. There is no tenderness.  Musculoskeletal: He exhibits no edema.  Neurological: He is alert and oriented to person, place, and time.  Fluent speech  Skin: Skin is warm and dry. No  rash noted.  Psychiatric: He has a normal mood and affect. Judgment normal.  Nursing note and vitals reviewed.   ED Course  Procedures (including critical care time) Labs Review Labs Reviewed  BASIC METABOLIC PANEL - Abnormal; Notable for the following:    Glucose, Bld 137 (*)    Creatinine, Ser 0.57 (*)    All other components within normal limits  CBC - Abnormal; Notable for the following:    Hemoglobin 12.3 (*)    HCT 36.2 (*)    All other components within normal limits  I-STAT TROPOININ, ED  Randolm Idol, ED    Imaging Review Dg Chest 2 View  03/17/2015   CLINICAL DATA:  Initial encounter for one-day history left chest pain.  EXAM: CHEST  2 VIEW  COMPARISON:  06/21/2013.  FINDINGS: The heart size and  mediastinal contours are within normal limits. Both lungs are clear. The visualized skeletal structures are unremarkable.  IMPRESSION: No active cardiopulmonary disease.   Electronically Signed   By: Misty Stanley M.D.   On: 03/17/2015 14:03   I have personally reviewed and evaluated these  lab results as part of my medical decision-making.   EKG Interpretation   Date/Time:  Friday March 17 2015 12:41:42 EDT Ventricular Rate:  65 PR Interval:  213 QRS Duration: 98 QT Interval:  362 QTC Calculation: 376 R Axis:   51 Text Interpretation:  Sinus rhythm Prolonged PR interval Abnormal R-wave  progression, early transition Minimal ST elevation, inferior leads  Baseline wander in lead(s) V5 minimal ST elevation present in previous  EKGs, no significant change from previous Confirmed by LITTLE MD, RACHEL  [phone]) on 03/17/2015 12:44:43 PM     Medications  aspirin chewable tablet 324 mg (243 mg Oral Given 03/17/15 1413)  ondansetron (ZOFRAN-ODT) disintegrating tablet 4 mg (4 mg Oral Given 03/17/15 1412)    MDM   Final diagnoses:  None  chest pain Cough Nausea Chemical exposure by inhalation   59 year old male who presents with chest pain, cough, and nausea that  began last night 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 He reports that his symptoms have persisted throughout today. At presentation, the patient was comfortable and O2 sats 98-99% on room air. No abnormal lung sounds and oropharynx was clear. EKG with no significant changes compared to previous. Placed patient on cardiac monitoring and obtained above lab work. Differential diagnosis is broad and includes ACS versus side effects from toxic exposure. The patient does not know what chemicals he may have inhaled but he reports that his trainer who was present beside him and had no symptoms. I spoke with poison control regarding the patient's presentation, story, and current exam. They stated that although we will not be able to determine what kind of chemical he may have been exposed to, all care would be supportive even if he had a chemical pneumonitis. He is demonstrating no signs of respiratory distress and chest x-ray reassuring.  Labs show normal troponin and unremarkable CBC and BMP. On reexamination, the patient remains comfortable with O2 sat 98% on room air. Normal work of breathing. I question the patient again regarding the timeline of onset of his symptoms. He repeatedly states that his nausea, chest pain, and cough began immediately after inhaling the chemical at work. He was having no symptoms prior to inhalation. I have considered ACS as the patient does have risk factors for heart disease. However given this timeline, I feel that mild reaction related to inhalation such as mild chemical pneumonitis is more likely explanation. His HEART score is 3 here. I have ordered a second troponin at 3 hours. I have discussed work up and plan with family and they are in agreement. Pt voices understanding of return precautions and will follow-up with his PCP next week. Patient will await second troponin and will be discharged if normal.   Sharlett Iles,  MD 03/17/15 [phone]

## 2015-04-03 ENCOUNTER — Other Ambulatory Visit: Payer: Self-pay | Admitting: Family Medicine

## 2015-05-01 ENCOUNTER — Ambulatory Visit (INDEPENDENT_AMBULATORY_CARE_PROVIDER_SITE_OTHER): Payer: BLUE CROSS/BLUE SHIELD

## 2015-05-01 ENCOUNTER — Ambulatory Visit (INDEPENDENT_AMBULATORY_CARE_PROVIDER_SITE_OTHER): Payer: BLUE CROSS/BLUE SHIELD | Admitting: Family Medicine

## 2015-05-01 ENCOUNTER — Encounter: Payer: Self-pay | Admitting: Family Medicine

## 2015-05-01 VITALS — BP 146/91 | HR 59 | Temp 98.2°F | Resp 16 | Ht 73.0 in | Wt 203.8 lb

## 2015-05-01 DIAGNOSIS — I10 Essential (primary) hypertension: Secondary | ICD-10-CM

## 2015-05-01 DIAGNOSIS — R001 Bradycardia, unspecified: Secondary | ICD-10-CM

## 2015-05-01 DIAGNOSIS — M25512 Pain in left shoulder: Secondary | ICD-10-CM

## 2015-05-01 DIAGNOSIS — E119 Type 2 diabetes mellitus without complications: Secondary | ICD-10-CM | POA: Diagnosis not present

## 2015-05-01 DIAGNOSIS — E785 Hyperlipidemia, unspecified: Secondary | ICD-10-CM

## 2015-05-01 DIAGNOSIS — R42 Dizziness and giddiness: Secondary | ICD-10-CM

## 2015-05-01 LAB — CBC
HCT: 40.2 % (ref 39.0–52.0)
Hemoglobin: 12.9 g/dL — ABNORMAL LOW (ref 13.0–17.0)
MCH: 27.2 pg (ref 26.0–34.0)
MCHC: 32.1 g/dL (ref 30.0–36.0)
MCV: 84.6 fL (ref 78.0–100.0)
MPV: 9.9 fL (ref 8.6–12.4)
PLATELETS: 267 10*3/uL (ref 150–400)
RBC: 4.75 MIL/uL (ref 4.22–5.81)
RDW: 14 % (ref 11.5–15.5)
WBC: 4.9 10*3/uL (ref 4.0–10.5)

## 2015-05-01 LAB — LIPID PANEL
CHOL/HDL RATIO: 4 ratio (ref <=5.0)
Cholesterol: 153 mg/dL (ref 125–200)
HDL: 38 mg/dL — AB (ref >=40)
LDL Cholesterol: 77 mg/dL (ref <130)
Triglycerides: 190 mg/dL — ABNORMAL HIGH (ref <150)
VLDL: 38 mg/dL — ABNORMAL HIGH (ref <30)

## 2015-05-01 LAB — COMPLETE METABOLIC PANEL WITH GFR
ALBUMIN: 4.3 g/dL (ref 3.6–5.1)
ALT: 13 U/L (ref 9–46)
AST: 16 U/L (ref 10–35)
Alkaline Phosphatase: 92 U/L (ref 40–115)
BILIRUBIN TOTAL: 0.5 mg/dL (ref 0.2–1.2)
BUN: 9 mg/dL (ref 7–25)
CO2: 29 mmol/L (ref 20–31)
Calcium: 8.8 mg/dL (ref 8.6–10.3)
Chloride: 98 mmol/L (ref 98–110)
Creat: 0.61 mg/dL — ABNORMAL LOW (ref 0.70–1.33)
GFR, Est African American: >89 mL/min (ref >=60)
Glucose, Bld: 139 mg/dL — ABNORMAL HIGH (ref 65–99)
POTASSIUM: 3.8 mmol/L (ref 3.5–5.3)
Sodium: 139 mmol/L (ref 135–146)
Total Protein: 6.9 g/dL (ref 6.1–8.1)

## 2015-05-01 LAB — GLUCOSE, POCT (MANUAL RESULT ENTRY): POC Glucose: 153 mg/dl — AB (ref 70–99)

## 2015-05-01 LAB — POCT GLYCOSYLATED HEMOGLOBIN (HGB A1C): Hemoglobin A1C: 7.7

## 2015-05-01 LAB — HEMOGLOBIN A1C: Hgb A1c MFr Bld: 7.7 % — AB (ref 4.0–6.0)

## 2015-05-01 LAB — TSH: TSH: 0.822 u[IU]/mL (ref 0.350–4.500)

## 2015-05-01 MED ORDER — ATORVASTATIN CALCIUM 10 MG PO TABS: ORAL_TABLET | ORAL | Status: DC

## 2015-05-01 MED ORDER — LISINOPRIL-HYDROCHLOROTHIAZIDE 20-25 MG PO TABS: 1.0000 | ORAL_TABLET | Freq: Every day | ORAL | Status: DC

## 2015-05-01 NOTE — Patient Instructions (Addendum)
Your dizziness may be due to the metoprolol. Stop metoprolol for now, continue lisinopril/hydrochlorothiazide (combination pill but same doses as before). Keep a record of your blood pressures outside of the office and bring them to the next office visit in 1 week for dizziness.   Return to the clinic or go to the nearest emergency room if any of your symptoms worsen or new symptoms occur.  Tylenol for shoulder pain, if not improving in next 2 weeks - call and I will refer you to orthopaedics. Return to the clinic or go to the nearest emergency room if any of your symptoms worsen or new symptoms occur.  You should receive a call or letter about your lab results within the next week to 10 days.   Impingement Syndrome, Rotator Cuff, Bursitis With Rehab Impingement syndrome is a condition that involves inflammation of the tendons of the rotator cuff and the subacromial bursa, that causes pain in the shoulder. The rotator cuff consists of four tendons and muscles that control much of the shoulder and upper arm function. The subacromial bursa is a fluid filled sac that helps reduce friction between the rotator cuff and one of the bones of the shoulder (acromion). Impingement syndrome is usually an overuse injury that causes swelling of the bursa (bursitis), swelling of the tendon (tendonitis), and/or a tear of the tendon (strain). Strains are classified into three categories. Grade 1 strains cause pain, but the tendon is not lengthened. Grade 2 strains include a lengthened ligament, due to the ligament being stretched or partially ruptured. With grade 2 strains there is still function, although the function may be decreased. Grade 3 strains include a complete tear of the tendon or muscle, and function is usually impaired. SYMPTOMS   Pain around the shoulder, often at the outer portion of the upper arm.  Pain that gets worse with shoulder function, especially when reaching overhead or lifting.  Sometimes,  aching when not using the arm.  Pain that wakes you up at night.  Sometimes, tenderness, swelling, warmth, or redness over the affected area.  Loss of strength.  Limited motion of the shoulder, especially reaching behind the back (to the back pocket or to unhook bra) or across your body.  Crackling sound (crepitation) when moving the arm.  Biceps tendon pain and inflammation (in the front of the shoulder). Worse when bending the elbow or lifting. CAUSES  Impingement syndrome is often an overuse injury, in which chronic (repetitive) motions cause the tendons or bursa to become inflamed. A strain occurs when a force is paced on the tendon or muscle that is greater than it can withstand. Common mechanisms of injury include: Stress from sudden increase in duration, frequency, or intensity of training.  Direct hit (trauma) to the shoulder.  Aging, erosion of the tendon with normal use.  Bony bump on shoulder (acromial spur). RISK INCREASES WITH:  Contact sports (football, wrestling, boxing).  Throwing sports (baseball, tennis, volleyball).  Weightlifting and bodybuilding.  Heavy labor.  Previous injury to the rotator cuff, including impingement.  Poor shoulder strength and flexibility.  Failure to warm up properly before activity.  Inadequate protective equipment.  Old age.  Bony bump on shoulder (acromial spur). PREVENTION   Warm up and stretch properly before activity.  Allow for adequate recovery between workouts.  Maintain physical fitness:  Strength, flexibility, and endurance.  Cardiovascular fitness.  Learn and use proper exercise technique. PROGNOSIS  If treated properly, impingement syndrome usually goes away within 6 weeks. Sometimes surgery  is required.  RELATED COMPLICATIONS   Longer healing time if not properly treated, or if not given enough time to heal.  Recurring symptoms, that result in a chronic condition.  Shoulder stiffness, frozen  shoulder, or loss of motion.  Rotator cuff tendon tear.  Recurring symptoms, especially if activity is resumed too soon, with overuse, with a direct blow, or when using poor technique. TREATMENT  Treatment first involves the use of ice and medicine, to reduce pain and inflammation. The use of strengthening and stretching exercises may help reduce pain with activity. These exercises may be performed at home or with a therapist. If non-surgical treatment is unsuccessful after more than 6 months, surgery may be advised. After surgery and rehabilitation, activity is usually possible in 3 months.  MEDICATION  If pain medicine is needed, nonsteroidal anti-inflammatory medicines (aspirin and ibuprofen), or other minor pain relievers (acetaminophen), are often advised.  Do not take pain medicine for 7 days before surgery.  Prescription pain relievers may be given, if your caregiver thinks they are needed. Use only as directed and only as much as you need.  Corticosteroid injections may be given by your caregiver. These injections should be reserved for the most serious cases, because they may only be given a certain number of times. HEAT AND COLD  Cold treatment (icing) should be applied for 10 to 15 minutes every 2 to 3 hours for inflammation and pain, and immediately after activity that aggravates your symptoms. Use ice packs or an ice massage.  Heat treatment may be used before performing stretching and strengthening activities prescribed by your caregiver, physical therapist, or athletic trainer. Use a heat pack or a warm water soak. SEEK MEDICAL CARE IF:   Symptoms get worse or do not improve in 4 to 6 weeks, despite treatment.  New, unexplained symptoms develop. (Drugs used in treatment may produce side effects.) EXERCISES  RANGE OF MOTION (ROM) AND STRETCHING EXERCISES - Impingement Syndrome (Rotator Cuff  Tendinitis, Bursitis) These exercises may help you when beginning to rehabilitate  your injury. Your symptoms may go away with or without further involvement from your physician, physical therapist or athletic trainer. While completing these exercises, remember:   Restoring tissue flexibility helps normal motion to return to the joints. This allows healthier, less painful movement and activity.  An effective stretch should be held for at least 30 seconds.  A stretch should never be painful. You should only feel a gentle lengthening or release in the stretched tissue. STRETCH - Flexion, Standing  Stand with good posture. With an underhand grip on your right / left hand, and an overhand grip on the opposite hand, grasp a broomstick or cane so that your hands are a little more than shoulder width apart.  Keeping your right / left elbow straight and shoulder muscles relaxed, push the stick with your opposite hand, to raise your right / left arm in front of your body and then overhead. Raise your arm until you feel a stretch in your right / left shoulder, but before you have increased shoulder pain.  Try to avoid shrugging your right / left shoulder as your arm rises, by keeping your shoulder blade tucked down and toward your mid-back spine. Hold for __________ seconds.  Slowly return to the starting position. Repeat __________ times. Complete this exercise __________ times per day. STRETCH - Abduction, Supine  Lie on your back. With an underhand grip on your right / left hand and an overhand grip on the opposite  hand, grasp a broomstick or cane so that your hands are a little more than shoulder width apart.  Keeping your right / left elbow straight and your shoulder muscles relaxed, push the stick with your opposite hand, to raise your right / left arm out to the side of your body and then overhead. Raise your arm until you feel a stretch in your right / left shoulder, but before you have increased shoulder pain.  Try to avoid shrugging your right / left shoulder as your arm  rises, by keeping your shoulder blade tucked down and toward your mid-back spine. Hold for __________ seconds.  Slowly return to the starting position. Repeat __________ times. Complete this exercise __________ times per day. ROM - Flexion, Active-Assisted  Lie on your back. You may bend your knees for comfort.  Grasp a broomstick or cane so your hands are about shoulder width apart. Your right / left hand should grip the end of the stick, so that your hand is positioned "thumbs-up," as if you were about to shake hands.  Using your healthy arm to lead, raise your right / left arm overhead, until you feel a gentle stretch in your shoulder. Hold for __________ seconds.  Use the stick to assist in returning your right / left arm to its starting position. Repeat __________ times. Complete this exercise __________ times per day.  ROM - Internal Rotation, Supine   Lie on your back on a firm surface. Place your right / left elbow about 60 degrees away from your side. Elevate your elbow with a folded towel, so that the elbow and shoulder are the same height.  Using a broomstick or cane and your strong arm, pull your right / left hand toward your body until you feel a gentle stretch, but no increase in your shoulder pain. Keep your shoulder and elbow in place throughout the exercise.  Hold for __________ seconds. Slowly return to the starting position. Repeat __________ times. Complete this exercise __________ times per day. STRETCH - Internal Rotation  Place your right / left hand behind your back, palm up.  Throw a towel or belt over your opposite shoulder. Grasp the towel with your right / left hand.  While keeping an upright posture, gently pull up on the towel, until you feel a stretch in the front of your right / left shoulder.  Avoid shrugging your right / left shoulder as your arm rises, by keeping your shoulder blade tucked down and toward your mid-back spine.  Hold for __________  seconds. Release the stretch, by lowering your healthy hand. Repeat __________ times. Complete this exercise __________ times per day. ROM - Internal Rotation   Using an underhand grip, grasp a stick behind your back with both hands.  While standing upright with good posture, slide the stick up your back until you feel a mild stretch in the front of your shoulder.  Hold for __________ seconds. Slowly return to your starting position. Repeat __________ times. Complete this exercise __________ times per day.  STRETCH - Posterior Shoulder Capsule   Stand or sit with good posture. Grasp your right / left elbow and draw it across your chest, keeping it at the same height as your shoulder.  Pull your elbow, so your upper arm comes in closer to your chest. Pull until you feel a gentle stretch in the back of your shoulder.  Hold for __________ seconds. Repeat __________ times. Complete this exercise __________ times per day. STRENGTHENING EXERCISES - Impingement Syndrome (  Rotator Cuff Tendinitis, Bursitis) These exercises may help you when beginning to rehabilitate your injury. They may resolve your symptoms with or without further involvement from your physician, physical therapist or athletic trainer. While completing these exercises, remember:  Muscles can gain both the endurance and the strength needed for everyday activities through controlled exercises.  Complete these exercises as instructed by your physician, physical therapist or athletic trainer. Increase the resistance and repetitions only as guided.  You may experience muscle soreness or fatigue, but the pain or discomfort you are trying to eliminate should never worsen during these exercises. If this pain does get worse, stop and make sure you are following the directions exactly. If the pain is still present after adjustments, discontinue the exercise until you can discuss the trouble with your clinician.  During your recovery, avoid  activity or exercises which involve actions that place your injured hand or elbow above your head or behind your back or head. These positions stress the tissues which you are trying to heal. STRENGTH - Scapular Depression and Adduction   With good posture, sit on a firm chair. Support your arms in front of you, with pillows, arm rests, or on a table top. Have your elbows in line with the sides of your body.  Gently draw your shoulder blades down and toward your mid-back spine. Gradually increase the tension, without tensing the muscles along the top of your shoulders and the back of your neck.  Hold for __________ seconds. Slowly release the tension and relax your muscles completely before starting the next repetition.  After you have practiced this exercise, remove the arm support and complete the exercise in standing as well as sitting position. Repeat __________ times. Complete this exercise __________ times per day.  STRENGTH - Shoulder Abductors, Isometric  With good posture, stand or sit about 4-6 inches from a wall, with your right / left side facing the wall.  Bend your right / left elbow. Gently press your right / left elbow into the wall. Increase the pressure gradually, until you are pressing as hard as you can, without shrugging your shoulder or increasing any shoulder discomfort.  Hold for __________ seconds.  Release the tension slowly. Relax your shoulder muscles completely before you begin the next repetition. Repeat __________ times. Complete this exercise __________ times per day.  STRENGTH - External Rotators, Isometric  Keep your right / left elbow at your side and bend it 90 degrees.  Step into a door frame so that the outside of your right / left wrist can press against the door frame without your upper arm leaving your side.  Gently press your right / left wrist into the door frame, as if you were trying to swing the back of your hand away from your stomach.  Gradually increase the tension, until you are pressing as hard as you can, without shrugging your shoulder or increasing any shoulder discomfort.  Hold for __________ seconds.  Release the tension slowly. Relax your shoulder muscles completely before you begin the next repetition. Repeat __________ times. Complete this exercise __________ times per day.  STRENGTH - Supraspinatus   Stand or sit with good posture. Grasp a __________ weight, or an exercise band or tubing, so that your hand is "thumbs-up," like you are shaking hands.  Slowly lift your right / left arm in a "V" away from your thigh, diagonally into the space between your side and straight ahead. Lift your hand to shoulder height or as far as  you can, without increasing any shoulder pain. At first, many people do not lift their hands above shoulder height.  Avoid shrugging your right / left shoulder as your arm rises, by keeping your shoulder blade tucked down and toward your mid-back spine.  Hold for __________ seconds. Control the descent of your hand, as you slowly return to your starting position. Repeat __________ times. Complete this exercise __________ times per day.  STRENGTH - External Rotators  Secure a rubber exercise band or tubing to a fixed object (table, pole) so that it is at the same height as your right / left elbow when you are standing or sitting on a firm surface.  Stand or sit so that the secured exercise band is at your uninjured side.  Bend your right / left elbow 90 degrees. Place a folded towel or small pillow under your right / left arm, so that your elbow is a few inches away from your side.  Keeping the tension on the exercise band, pull it away from your body, as if pivoting on your elbow. Be sure to keep your body steady, so that the movement is coming only from your rotating shoulder.  Hold for __________ seconds. Release the tension in a controlled manner, as you return to the starting  position. Repeat __________ times. Complete this exercise __________ times per day.  STRENGTH - Internal Rotators   Secure a rubber exercise band or tubing to a fixed object (table, pole) so that it is at the same height as your right / left elbow when you are standing or sitting on a firm surface.  Stand or sit so that the secured exercise band is at your right / left side.  Bend your elbow 90 degrees. Place a folded towel or small pillow under your right / left arm so that your elbow is a few inches away from your side.  Keeping the tension on the exercise band, pull it across your body, toward your stomach. Be sure to keep your body steady, so that the movement is coming only from your rotating shoulder.  Hold for __________ seconds. Release the tension in a controlled manner, as you return to the starting position. Repeat __________ times. Complete this exercise __________ times per day.  STRENGTH - Scapular Protractors, Standing   Stand arms length away from a wall. Place your hands on the wall, keeping your elbows straight.  Begin by dropping your shoulder blades down and toward your mid-back spine.  To strengthen your protractors, keep your shoulder blades down, but slide them forward on your rib cage. It will feel as if you are lifting the back of your rib cage away from the wall. This is a subtle motion and can be challenging to complete. Ask your caregiver for further instruction, if you are not sure you are doing the exercise correctly.  Hold for __________ seconds. Slowly return to the starting position, resting the muscles completely before starting the next repetition. Repeat __________ times. Complete this exercise __________ times per day. STRENGTH - Scapular Protractors, Supine  Lie on your back on a firm surface. Extend your right / left arm straight into the air while holding a __________ weight in your hand.  Keeping your head and back in place, lift your shoulder off  the floor.  Hold for __________ seconds. Slowly return to the starting position, and allow your muscles to relax completely before starting the next repetition. Repeat __________ times. Complete this exercise __________ times per day. STRENGTH -  Scapular Protractors, Quadruped  Get onto your hands and knees, with your shoulders directly over your hands (or as close as you can be, comfortably).  Keeping your elbows locked, lift the back of your rib cage up into your shoulder blades, so your mid-back rounds out. Keep your neck muscles relaxed.  Hold this position for __________ seconds. Slowly return to the starting position and allow your muscles to relax completely before starting the next repetition. Repeat __________ times. Complete this exercise __________ times per day.  STRENGTH - Scapular Retractors  Secure a rubber exercise band or tubing to a fixed object (table, pole), so that it is at the height of your shoulders when you are either standing, or sitting on a firm armless chair.  With a palm down grip, grasp an end of the band in each hand. Straighten your elbows and lift your hands straight in front of you, at shoulder height. Step back, away from the secured end of the band, until it becomes tense.  Squeezing your shoulder blades together, draw your elbows back toward your sides, as you bend them. Keep your upper arms lifted away from your body throughout the exercise.  Hold for __________ seconds. Slowly ease the tension on the band, as you reverse the directions and return to the starting position. Repeat __________ times. Complete this exercise __________ times per day. STRENGTH - Shoulder Extensors   Secure a rubber exercise band or tubing to a fixed object (table, pole) so that it is at the height of your shoulders when you are either standing, or sitting on a firm armless chair.  With a thumbs-up grip, grasp an end of the band in each hand. Straighten your elbows and lift  your hands straight in front of you, at shoulder height. Step back, away from the secured end of the band, until it becomes tense.  Squeezing your shoulder blades together, pull your hands down to the sides of your thighs. Do not allow your hands to go behind you.  Hold for __________ seconds. Slowly ease the tension on the band, as you reverse the directions and return to the starting position. Repeat __________ times. Complete this exercise __________ times per day.  STRENGTH - Scapular Retractors and External Rotators   Secure a rubber exercise band or tubing to a fixed object (table, pole) so that it is at the height as your shoulders, when you are either standing, or sitting on a firm armless chair.  With a palm down grip, grasp an end of the band in each hand. Bend your elbows 90 degrees and lift your elbows to shoulder height, at your sides. Step back, away from the secured end of the band, until it becomes tense.  Squeezing your shoulder blades together, rotate your shoulders so that your upper arms and elbows remain stationary, but your fists travel upward to head height.  Hold for __________ seconds. Slowly ease the tension on the band, as you reverse the directions and return to the starting position. Repeat __________ times. Complete this exercise __________ times per day.  STRENGTH - Scapular Retractors and External Rotators, Rowing   Secure a rubber exercise band or tubing to a fixed object (table, pole) so that it is at the height of your shoulders, when you are either standing, or sitting on a firm armless chair.  With a palm down grip, grasp an end of the band in each hand. Straighten your elbows and lift your hands straight in front of you, at shoulder  height. Step back, away from the secured end of the band, until it becomes tense.  Step 1: Squeeze your shoulder blades together. Bending your elbows, draw your hands to your chest, as if you are rowing a boat. At the end of  this motion, your hands and elbow should be at shoulder height and your elbows should be out to your sides.  Step 2: Rotate your shoulders, to raise your hands above your head. Your forearms should be vertical and your upper arms should be horizontal.  Hold for __________ seconds. Slowly ease the tension on the band, as you reverse the directions and return to the starting position. Repeat __________ times. Complete this exercise __________ times per day.  STRENGTH - Scapular Depressors  Find a sturdy chair without wheels, such as a dining room chair.  Keeping your feet on the floor, and your hands on the chair arms, lift your bottom up from the seat, and lock your elbows.  Keeping your elbows straight, allow gravity to pull your body weight down. Your shoulders will rise toward your ears.  Raise your body against gravity by drawing your shoulder blades down your back, shortening the distance between your shoulders and ears. Although your feet should always maintain contact with the floor, your feet should progressively support less body weight, as you get stronger.  Hold for __________ seconds. In a controlled and slow manner, lower your body weight to begin the next repetition. Repeat __________ times. Complete this exercise __________ times per day.    This information is not intended to replace advice given to you by your health care provider. Make sure you discuss any questions you have with your health care provider.   Document Released: 06/10/2005 Document Revised: 07/01/2014 Document Reviewed: 09/22/2008 [employer] Interactive Patient Education [employer].

## 2015-05-01 NOTE — Progress Notes (Addendum)
Subjective:    Patient ID: Brendan Gonzales, male    DOB: [birth year], 59 y.o.   MRN: [id number] This chart was scribed for [name], MD by Zola Button, Medical Scribe. This patient was seen in Room 26 and the patient's care was started at 11:15 AM.    HPI HPI Comments: Brendan Gonzales is a 59 y.o. male who presents to the [facility] for multiple concerns today, including diabetes, hyperlipidemia, hypertension, left shoulder pain, and dizziness.   Diabetes: He has not seen me since February of this year. On metformin 1000 mg BID. No changes last visit. He has not been checking his blood sugars at home recently.  Lab Results  Component Value Date   HGBA1C 6.9 10/17/2014    Hyperlipidemia: Takes Lipitor 10 mg qd. This was continued last visit.  Lab Results  Component Value Date   ALT 13 10/17/2014   AST 16 10/17/2014   ALKPHOS 67 10/17/2014   BILITOT 0.4 10/17/2014    Lab Results  Component Value Date   CHOL 153 10/17/2014   HDL 41 10/17/2014   LDLCALC 58 10/17/2014   TRIG 270* 10/17/2014   CHOLHDL 3.7 10/17/2014    Hypertension: He is on HCTZ 25 mg qd, lisinopril 20 mg qd as well as Toprol 25 mg qd. He has not been checking his blood pressures at home recently.  Shoulder pain: Patient reports having some left shoulder pain with occasional clicking that started about 2 weeks ago. When he has episodes of the pain, he has to sit down at rest for 10-15 minutes due to the pain. He associates the pain to carrying heavy trays at work. The pain was worse last week. He has been taking Tylenol every 6 hours for the pain. Patient denies known injury. No prior shoulder surgeries.  Dizziness: Has been to the ER twice. It appears he was seen in the ER on September 23rd for chest pain. He had a prolonged PR Interval, abnormal R-wave progression, early transition, minimal ST-elevation, inferior leads. No significant change from previous EKG. Possible exposure to chemicals at  work. Possible chemical pneumonitis. He had a normal troponin, CBC with borderline HGB of 12.3, and BMP. Negative CXR. Negative troponin x2. Of note, he had a cardiac echo in 2014 with grade 2 diastolic dysfunction, normal wall motion, normal systolic function. He was hospitalized at that time due to chest pain. He had a negative chemical stress test with EF 61% in December 2014.  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 This has not changed as far as he is aware. He had a cancer screening at [facility] and was told to keep an eye on the mark.   Patient Active Problem List   Diagnosis Date Noted  . Eczema of hand 10/19/2014  . Type 2 diabetes mellitus ([place]) 06/13/2013  . HTN (hypertension) 06/13/2013  . Normal coronary arteries- 2008 06/13/2013  . Chest pain with moderate risk of acute coronary syndrome 06/12/2013  . Special screening for malignant neoplasms, colon 06/27/2011  . Arthritis of knee 06/14/2011   Past Medical History  Diagnosis Date  . Hypertension     benign  . Diabetes mellitus     type 2  . Arthritis   . Headache(784.0)   . Hyperlipidemia   . Constipation   . Dizziness    Past Surgical History  Procedure Laterality Date  . Colonoscopy    . Right femeroral bypass    .  Right knee surgery    . Coronary angioplasty    . Hemorrhoid surgery     No Known Allergies Prior to Admission medications   Medication Sig Start Date End Date Taking? Authorizing Provider  acetaminophen (TYLENOL) 500 MG tablet Take 500 mg by mouth every 6 (six) hours as needed for mild pain.    Yes Historical Provider, MD  aspirin 81 MG tablet Take 81 mg by mouth every morning.    Yes Historical Provider, MD  atorvastatin (LIPITOR) 10 MG tablet TAKE ONE TABLET BY MOUTH ONCE DAILY  "OFFICE VISIT NEEDED FOR REFILLS" 04/04/15  Yes Mancel Bale, PA-C  Cyanocobalamin (VITAMIN B 12 PO) Take 1,000 mg by mouth daily.   Yes Historical Provider, MD    cyclobenzaprine (FLEXERIL) 10 MG tablet Take 1 tablet (10 mg total) by mouth 3 (three) times daily as needed for muscle spasms. 01/29/15  Yes Christopher Lawyer, PA-C  fluticasone (FLONASE) 50 MCG/ACT nasal spray Place 2 sprays into both nostrils daily. Patient taking differently: Place 2 sprays into both nostrils daily as needed for allergies.  10/17/14  Yes [name], MD  GARLIC PO Take 1 capsule by mouth every morning.   Yes Historical Provider, MD  hydrochlorothiazide (HYDRODIURIL) 25 MG tablet Take 1 tablet (25 mg total) by mouth every morning. 10/17/14  Yes [name], MD  lisinopril (PRINIVIL,ZESTRIL) 20 MG tablet TAKE ONE TABLET BY MOUTH IN THE MORNING 03/07/14  Yes [name], MD  meloxicam (MOBIC) 7.5 MG tablet TAKE ONE TABLET BY MOUTH ONCE DAILY 08/05/14  Yes [name], MD  metFORMIN (GLUCOPHAGE) 500 MG tablet Take 2 tabs by mouth in morning with meal, take one tab by mouth in the evening with meal Patient taking differently: Take 500-1,000 mg by mouth 2 (two) times daily with a meal. Take 2 tabs by mouth in morning with meal, take one tab by mouth in the evening with meal 10/17/14  Yes [name], MD  metoprolol succinate (TOPROL-XL) 25 MG 24 hr tablet Take 1 tablet (25 mg total) by mouth daily. 10/17/14  Yes [name], MD  Multiple Vitamin (MULTIVITAMIN WITH MINERALS) TABS tablet Take 1 tablet by mouth daily.   Yes Historical Provider, MD  pantoprazole (PROTONIX) 40 MG tablet Take 1 tablet (40 mg total) by mouth daily. 10/17/14  Yes [name], MD  triamcinolone cream (KENALOG) 0.1 % Apply 1 application topically 2 (two) times daily. 10/17/14  Yes [name], MD  HYDROcodone-acetaminophen (NORCO/VICODIN) 5-325 MG per tablet Take 1 tablet by mouth every 6 (six) hours as needed for moderate pain. Patient not taking: Reported on 05/01/2015 01/29/15   Dalia Heading, PA-C   Social History   Social History  . Marital Status: Married    Spouse  Name: N/A  . Number of Children: N/A  . Years of Education: N/A   Occupational History  . Not on file.   Social History Main Topics  . Smoking status: Former Research scientist (life sciences)  . Smokeless tobacco: Not on file  . Alcohol Use: No  . Drug Use: No  . Sexual Activity: Not on file   Other Topics Concern  . Not on file   Social History Narrative     Review of Systems  Musculoskeletal: Positive for arthralgias.  Neurological: Positive for dizziness.       Objective:   Physical Exam  Constitutional: He is oriented to person, place, and time. He appears well-developed and well-nourished.  HENT:  Head:  Normocephalic and atraumatic.  Eyes: EOM are normal. Pupils are equal, round, and reactive to light. Right eye exhibits no nystagmus. Left eye exhibits no nystagmus.  Neck: No JVD present. Carotid bruit is not present.  Cardiovascular: Normal rate, regular rhythm and normal heart sounds.   No murmur heard. Pulmonary/Chest: Effort normal and breath sounds normal. He has no rales.  Musculoskeletal: He exhibits tenderness. He exhibits no edema.  Pain located to top of left trapezius and dorsal shoulder. [place] and clavicle non-tender. Reproduction of pain over AC and dorsal shoulder. Skin intact. Unable to elevate left arm without assistance from the right. Guarded exam, unable to complete relax left arm. Able to achieve passive ROM, but active ROM painful with flexion. Pain with positive drop arm test. Pain with empty can testing. Pain with Hawkins and Neer's.  Neurological: He is alert and oriented to person, place, and time. He displays a negative Romberg sign.  Non-focal neuro exam. No pronator drift.  Skin: Skin is warm and dry. No erythema.  6 cm x 3.5 cm hyperpigmented nevus, left upper back. No surrounding erythema. No apparent changes in color throughout nevus.  Psychiatric: He has a normal mood and affect.  Vitals reviewed.  [facility] (PRIMARY) x-ray report read by Dr. Carlota Raspberry: Left shoulder -  Minimal degenerative change at left humeral head and AC joint. No acute bony findings.   Filed Vitals:   05/01/15 1030  BP: 122/78  Pulse: 56  Temp: 98.2 F (36.8 C)  TempSrc: Oral  Resp: 16  Height: 6\' 1"  (1.854 m)  Weight: 203 lb 12.8 oz (92.443 kg)  SpO2: 97%   EKG: sinus bradycardia, 1st degree av block  orthotatic vs reviewed - no significant change.     Assessment & Plan:   Longino Trefz is a 59 y.o. male Essential hypertension - Plan: atorvastatin (LIPITOR) 10 MG tablet, EKG 12-Lead, COMPLETE METABOLIC PANEL WITH GFR, Lipid panel, lisinopril-hydrochlorothiazide (PRINZIDE,ZESTORETIC) 20-25 MG tablet  - overall controlled, may have some dizziness secondary to bradycardia with the metoprolol. We'll hold metoprolol for now, check home blood pressure readings, if remain over 140/90, may need to add back in another agent. Labs pending.  Bradycardia Dizziness - Plan: EKG 12-Lead, TSH, CBC   -may be related to beta blocker. Stop Toprol as above, RTC/ER precautions if persists,  And recheck in 1 week to discuss further.  Type 2 diabetes mellitus without complication, without long-term current use of insulin (HCC) - Plan: POCT glucose (manual entry), POCT glycosylated hemoglobin (Hb A1C)  - decrease control. Will recommend he change diet, activity initially, continue metformin at same dose for now, but may need additional agent if he remains elevated next visit.  Shoulder pain, left - Plan: DG Shoulder Left  -  Probable rotator cuff tendinosis with secondary impingement.  No concerning findings on x-ray. Trial of Tylenol over-the-counter, home exercise program and range of motion by handout. If not improving in next few weeks, plan to refer to orthopedic.  Hyperlipidemia - Plan: COMPLETE METABOLIC PANEL WITH GFR, Lipid panel  - Labs pending. Continue  Lipitor same dose 10 mg daily.  Meds ordered this encounter  Medications  . atorvastatin (LIPITOR) 10 MG tablet    Sig: TAKE ONE  TABLET BY MOUTH ONCE DAILY    Dispense:  90 tablet    Refill:  1  . lisinopril-hydrochlorothiazide (PRINZIDE,ZESTORETIC) 20-25 MG tablet    Sig: Take 1 tablet by mouth daily.    Dispense:  90 tablet    Refill:  1   Patient Instructions  Your dizziness may be due to the metoprolol. Stop metoprolol for now, continue lisinopril/hydrochlorothiazide (combination pill but same doses as before). Keep a record of your blood pressures outside of the office and bring them to the next office visit in 1 week for dizziness.   Return to the clinic or go to the nearest emergency room if any of your symptoms worsen or new symptoms occur.  Tylenol for shoulder pain, if not improving in next 2 weeks - call and I will refer you to orthopaedics. Return to the clinic or go to the nearest emergency room if any of your symptoms worsen or new symptoms occur.  You should receive a call or letter about your lab results within the next week to 10 days.   Impingement Syndrome, Rotator Cuff, Bursitis With Rehab Impingement syndrome is a condition that involves inflammation of the tendons of the rotator cuff and the subacromial bursa, that causes pain in the shoulder. The rotator cuff consists of four tendons and muscles that control much of the shoulder and upper arm function. The subacromial bursa is a fluid filled sac that helps reduce friction between the rotator cuff and one of the bones of the shoulder (acromion). Impingement syndrome is usually an overuse injury that causes swelling of the bursa (bursitis), swelling of the tendon (tendonitis), and/or a tear of the tendon (strain). Strains are classified into three categories. Grade 1 strains cause pain, but the tendon is not lengthened. Grade 2 strains include a lengthened ligament, due to the ligament being stretched or partially ruptured. With grade 2 strains there is still function, although the function may be decreased. Grade 3 strains include a complete tear of the  tendon or muscle, and function is usually impaired. SYMPTOMS   Pain around the shoulder, often at the outer portion of the upper arm.  Pain that gets worse with shoulder function, especially when reaching overhead or lifting.  Sometimes, aching when not using the arm.  Pain that wakes you up at night.  Sometimes, tenderness, swelling, warmth, or redness over the affected area.  Loss of strength.  Limited motion of the shoulder, especially reaching behind the back (to the back pocket or to unhook bra) or across your body.  Crackling sound (crepitation) when moving the arm.  Biceps tendon pain and inflammation (in the front of the shoulder). Worse when bending the elbow or lifting. CAUSES  Impingement syndrome is often an overuse injury, in which chronic (repetitive) motions cause the tendons or bursa to become inflamed. A strain occurs when a force is paced on the tendon or muscle that is greater than it can withstand. Common mechanisms of injury include: Stress from sudden increase in duration, frequency, or intensity of training.  Direct hit (trauma) to the shoulder.  Aging, erosion of the tendon with normal use.  Bony bump on shoulder (acromial spur). RISK INCREASES WITH:  Contact sports (football, wrestling, boxing).  Throwing sports (baseball, tennis, volleyball).  Weightlifting and bodybuilding.  Heavy labor.  Previous injury to the rotator cuff, including impingement.  Poor shoulder strength and flexibility.  Failure to warm up properly before activity.  Inadequate protective equipment.  Old age.  Bony bump on shoulder (acromial spur). PREVENTION   Warm up and stretch properly before activity.  Allow for adequate recovery between workouts.  Maintain physical fitness:  Strength, flexibility, and endurance.  Cardiovascular fitness.  Learn and use proper exercise technique. PROGNOSIS  If treated properly, impingement syndrome usually goes  away within  6 weeks. Sometimes surgery is required.  RELATED COMPLICATIONS   Longer healing time if not properly treated, or if not given enough time to heal.  Recurring symptoms, that result in a chronic condition.  Shoulder stiffness, frozen shoulder, or loss of motion.  Rotator cuff tendon tear.  Recurring symptoms, especially if activity is resumed too soon, with overuse, with a direct blow, or when using poor technique. TREATMENT  Treatment first involves the use of ice and medicine, to reduce pain and inflammation. The use of strengthening and stretching exercises may help reduce pain with activity. These exercises may be performed at home or with a therapist. If non-surgical treatment is unsuccessful after more than 6 months, surgery may be advised. After surgery and rehabilitation, activity is usually possible in 3 months.  MEDICATION  If pain medicine is needed, nonsteroidal anti-inflammatory medicines (aspirin and ibuprofen), or other minor pain relievers (acetaminophen), are often advised.  Do not take pain medicine for 7 days before surgery.  Prescription pain relievers may be given, if your caregiver thinks they are needed. Use only as directed and only as much as you need.  Corticosteroid injections may be given by your caregiver. These injections should be reserved for the most serious cases, because they may only be given a certain number of times. HEAT AND COLD  Cold treatment (icing) should be applied for 10 to 15 minutes every 2 to 3 hours for inflammation and pain, and immediately after activity that aggravates your symptoms. Use ice packs or an ice massage.  Heat treatment may be used before performing stretching and strengthening activities prescribed by your caregiver, physical therapist, or athletic trainer. Use a heat pack or a warm water soak. SEEK MEDICAL CARE IF:   Symptoms get worse or do not improve in 4 to 6 weeks, despite treatment.  New, unexplained symptoms  develop. (Drugs used in treatment may produce side effects.) EXERCISES  RANGE OF MOTION (ROM) AND STRETCHING EXERCISES - Impingement Syndrome (Rotator Cuff  Tendinitis, Bursitis) These exercises may help you when beginning to rehabilitate your injury. Your symptoms may go away with or without further involvement from your physician, physical therapist or athletic trainer. While completing these exercises, remember:   Restoring tissue flexibility helps normal motion to return to the joints. This allows healthier, less painful movement and activity.  An effective stretch should be held for at least 30 seconds.  A stretch should never be painful. You should only feel a gentle lengthening or release in the stretched tissue. STRETCH - Flexion, Standing  Stand with good posture. With an underhand grip on your right / left hand, and an overhand grip on the opposite hand, grasp a broomstick or cane so that your hands are a little more than shoulder width apart.  Keeping your right / left elbow straight and shoulder muscles relaxed, push the stick with your opposite hand, to raise your right / left arm in front of your body and then overhead. Raise your arm until you feel a stretch in your right / left shoulder, but before you have increased shoulder pain.  Try to avoid shrugging your right / left shoulder as your arm rises, by keeping your shoulder blade tucked down and toward your mid-back spine. Hold for __________ seconds.  Slowly return to the starting position. Repeat __________ times. Complete this exercise __________ times per day. STRETCH - Abduction, Supine  Lie on your back. With an underhand grip on your right / left hand and  an overhand grip on the opposite hand, grasp a broomstick or cane so that your hands are a little more than shoulder width apart.  Keeping your right / left elbow straight and your shoulder muscles relaxed, push the stick with your opposite hand, to raise your right  / left arm out to the side of your body and then overhead. Raise your arm until you feel a stretch in your right / left shoulder, but before you have increased shoulder pain.  Try to avoid shrugging your right / left shoulder as your arm rises, by keeping your shoulder blade tucked down and toward your mid-back spine. Hold for __________ seconds.  Slowly return to the starting position. Repeat __________ times. Complete this exercise __________ times per day. ROM - Flexion, Active-Assisted  Lie on your back. You may bend your knees for comfort.  Grasp a broomstick or cane so your hands are about shoulder width apart. Your right / left hand should grip the end of the stick, so that your hand is positioned "thumbs-up," as if you were about to shake hands.  Using your healthy arm to lead, raise your right / left arm overhead, until you feel a gentle stretch in your shoulder. Hold for __________ seconds.  Use the stick to assist in returning your right / left arm to its starting position. Repeat __________ times. Complete this exercise __________ times per day.  ROM - Internal Rotation, Supine   Lie on your back on a firm surface. Place your right / left elbow about 60 degrees away from your side. Elevate your elbow with a folded towel, so that the elbow and shoulder are the same height.  Using a broomstick or cane and your strong arm, pull your right / left hand toward your body until you feel a gentle stretch, but no increase in your shoulder pain. Keep your shoulder and elbow in place throughout the exercise.  Hold for __________ seconds. Slowly return to the starting position. Repeat __________ times. Complete this exercise __________ times per day. STRETCH - Internal Rotation  Place your right / left hand behind your back, palm up.  Throw a towel or belt over your opposite shoulder. Grasp the towel with your right / left hand.  While keeping an upright posture, gently pull up on the  towel, until you feel a stretch in the front of your right / left shoulder.  Avoid shrugging your right / left shoulder as your arm rises, by keeping your shoulder blade tucked down and toward your mid-back spine.  Hold for __________ seconds. Release the stretch, by lowering your healthy hand. Repeat __________ times. Complete this exercise __________ times per day. ROM - Internal Rotation   Using an underhand grip, grasp a stick behind your back with both hands.  While standing upright with good posture, slide the stick up your back until you feel a mild stretch in the front of your shoulder.  Hold for __________ seconds. Slowly return to your starting position. Repeat __________ times. Complete this exercise __________ times per day.  STRETCH - Posterior Shoulder Capsule   Stand or sit with good posture. Grasp your right / left elbow and draw it across your chest, keeping it at the same height as your shoulder.  Pull your elbow, so your upper arm comes in closer to your chest. Pull until you feel a gentle stretch in the back of your shoulder.  Hold for __________ seconds. Repeat __________ times. Complete this exercise __________ times per  day. STRENGTHENING EXERCISES - Impingement Syndrome (Rotator Cuff Tendinitis, Bursitis) These exercises may help you when beginning to rehabilitate your injury. They may resolve your symptoms with or without further involvement from your physician, physical therapist or athletic trainer. While completing these exercises, remember:  Muscles can gain both the endurance and the strength needed for everyday activities through controlled exercises.  Complete these exercises as instructed by your physician, physical therapist or athletic trainer. Increase the resistance and repetitions only as guided.  You may experience muscle soreness or fatigue, but the pain or discomfort you are trying to eliminate should never worsen during these exercises. If this  pain does get worse, stop and make sure you are following the directions exactly. If the pain is still present after adjustments, discontinue the exercise until you can discuss the trouble with your clinician.  During your recovery, avoid activity or exercises which involve actions that place your injured hand or elbow above your head or behind your back or head. These positions stress the tissues which you are trying to heal. STRENGTH - Scapular Depression and Adduction   With good posture, sit on a firm chair. Support your arms in front of you, with pillows, arm rests, or on a table top. Have your elbows in line with the sides of your body.  Gently draw your shoulder blades down and toward your mid-back spine. Gradually increase the tension, without tensing the muscles along the top of your shoulders and the back of your neck.  Hold for __________ seconds. Slowly release the tension and relax your muscles completely before starting the next repetition.  After you have practiced this exercise, remove the arm support and complete the exercise in standing as well as sitting position. Repeat __________ times. Complete this exercise __________ times per day.  STRENGTH - Shoulder Abductors, Isometric  With good posture, stand or sit about 4-6 inches from a wall, with your right / left side facing the wall.  Bend your right / left elbow. Gently press your right / left elbow into the wall. Increase the pressure gradually, until you are pressing as hard as you can, without shrugging your shoulder or increasing any shoulder discomfort.  Hold for __________ seconds.  Release the tension slowly. Relax your shoulder muscles completely before you begin the next repetition. Repeat __________ times. Complete this exercise __________ times per day.  STRENGTH - External Rotators, Isometric  Keep your right / left elbow at your side and bend it 90 degrees.  Step into a door frame so that the outside of  your right / left wrist can press against the door frame without your upper arm leaving your side.  Gently press your right / left wrist into the door frame, as if you were trying to swing the back of your hand away from your stomach. Gradually increase the tension, until you are pressing as hard as you can, without shrugging your shoulder or increasing any shoulder discomfort.  Hold for __________ seconds.  Release the tension slowly. Relax your shoulder muscles completely before you begin the next repetition. Repeat __________ times. Complete this exercise __________ times per day.  STRENGTH - Supraspinatus   Stand or sit with good posture. Grasp a __________ weight, or an exercise band or tubing, so that your hand is "thumbs-up," like you are shaking hands.  Slowly lift your right / left arm in a "V" away from your thigh, diagonally into the space between your side and straight ahead. Lift your hand to  shoulder height or as far as you can, without increasing any shoulder pain. At first, many people do not lift their hands above shoulder height.  Avoid shrugging your right / left shoulder as your arm rises, by keeping your shoulder blade tucked down and toward your mid-back spine.  Hold for __________ seconds. Control the descent of your hand, as you slowly return to your starting position. Repeat __________ times. Complete this exercise __________ times per day.  STRENGTH - External Rotators  Secure a rubber exercise band or tubing to a fixed object (table, pole) so that it is at the same height as your right / left elbow when you are standing or sitting on a firm surface.  Stand or sit so that the secured exercise band is at your uninjured side.  Bend your right / left elbow 90 degrees. Place a folded towel or small pillow under your right / left arm, so that your elbow is a few inches away from your side.  Keeping the tension on the exercise band, pull it away from your body, as if  pivoting on your elbow. Be sure to keep your body steady, so that the movement is coming only from your rotating shoulder.  Hold for __________ seconds. Release the tension in a controlled manner, as you return to the starting position. Repeat __________ times. Complete this exercise __________ times per day.  STRENGTH - Internal Rotators   Secure a rubber exercise band or tubing to a fixed object (table, pole) so that it is at the same height as your right / left elbow when you are standing or sitting on a firm surface.  Stand or sit so that the secured exercise band is at your right / left side.  Bend your elbow 90 degrees. Place a folded towel or small pillow under your right / left arm so that your elbow is a few inches away from your side.  Keeping the tension on the exercise band, pull it across your body, toward your stomach. Be sure to keep your body steady, so that the movement is coming only from your rotating shoulder.  Hold for __________ seconds. Release the tension in a controlled manner, as you return to the starting position. Repeat __________ times. Complete this exercise __________ times per day.  STRENGTH - Scapular Protractors, Standing   Stand arms length away from a wall. Place your hands on the wall, keeping your elbows straight.  Begin by dropping your shoulder blades down and toward your mid-back spine.  To strengthen your protractors, keep your shoulder blades down, but slide them forward on your rib cage. It will feel as if you are lifting the back of your rib cage away from the wall. This is a subtle motion and can be challenging to complete. Ask your caregiver for further instruction, if you are not sure you are doing the exercise correctly.  Hold for __________ seconds. Slowly return to the starting position, resting the muscles completely before starting the next repetition. Repeat __________ times. Complete this exercise __________ times per day. STRENGTH -  Scapular Protractors, Supine  Lie on your back on a firm surface. Extend your right / left arm straight into the air while holding a __________ weight in your hand.  Keeping your head and back in place, lift your shoulder off the floor.  Hold for __________ seconds. Slowly return to the starting position, and allow your muscles to relax completely before starting the next repetition. Repeat __________ times. Complete this  exercise __________ times per day. STRENGTH - Scapular Protractors, Quadruped  Get onto your hands and knees, with your shoulders directly over your hands (or as close as you can be, comfortably).  Keeping your elbows locked, lift the back of your rib cage up into your shoulder blades, so your mid-back rounds out. Keep your neck muscles relaxed.  Hold this position for __________ seconds. Slowly return to the starting position and allow your muscles to relax completely before starting the next repetition. Repeat __________ times. Complete this exercise __________ times per day.  STRENGTH - Scapular Retractors  Secure a rubber exercise band or tubing to a fixed object (table, pole), so that it is at the height of your shoulders when you are either standing, or sitting on a firm armless chair.  With a palm down grip, grasp an end of the band in each hand. Straighten your elbows and lift your hands straight in front of you, at shoulder height. Step back, away from the secured end of the band, until it becomes tense.  Squeezing your shoulder blades together, draw your elbows back toward your sides, as you bend them. Keep your upper arms lifted away from your body throughout the exercise.  Hold for __________ seconds. Slowly ease the tension on the band, as you reverse the directions and return to the starting position. Repeat __________ times. Complete this exercise __________ times per day. STRENGTH - Shoulder Extensors   Secure a rubber exercise band or tubing to a fixed  object (table, pole) so that it is at the height of your shoulders when you are either standing, or sitting on a firm armless chair.  With a thumbs-up grip, grasp an end of the band in each hand. Straighten your elbows and lift your hands straight in front of you, at shoulder height. Step back, away from the secured end of the band, until it becomes tense.  Squeezing your shoulder blades together, pull your hands down to the sides of your thighs. Do not allow your hands to go behind you.  Hold for __________ seconds. Slowly ease the tension on the band, as you reverse the directions and return to the starting position. Repeat __________ times. Complete this exercise __________ times per day.  STRENGTH - Scapular Retractors and External Rotators   Secure a rubber exercise band or tubing to a fixed object (table, pole) so that it is at the height as your shoulders, when you are either standing, or sitting on a firm armless chair.  With a palm down grip, grasp an end of the band in each hand. Bend your elbows 90 degrees and lift your elbows to shoulder height, at your sides. Step back, away from the secured end of the band, until it becomes tense.  Squeezing your shoulder blades together, rotate your shoulders so that your upper arms and elbows remain stationary, but your fists travel upward to head height.  Hold for __________ seconds. Slowly ease the tension on the band, as you reverse the directions and return to the starting position. Repeat __________ times. Complete this exercise __________ times per day.  STRENGTH - Scapular Retractors and External Rotators, Rowing   Secure a rubber exercise band or tubing to a fixed object (table, pole) so that it is at the height of your shoulders, when you are either standing, or sitting on a firm armless chair.  With a palm down grip, grasp an end of the band in each hand. Straighten your elbows and lift your hands  straight in front of you, at shoulder  height. Step back, away from the secured end of the band, until it becomes tense.  Step 1: Squeeze your shoulder blades together. Bending your elbows, draw your hands to your chest, as if you are rowing a boat. At the end of this motion, your hands and elbow should be at shoulder height and your elbows should be out to your sides.  Step 2: Rotate your shoulders, to raise your hands above your head. Your forearms should be vertical and your upper arms should be horizontal.  Hold for __________ seconds. Slowly ease the tension on the band, as you reverse the directions and return to the starting position. Repeat __________ times. Complete this exercise __________ times per day.  STRENGTH - Scapular Depressors  Find a sturdy chair without wheels, such as a dining room chair.  Keeping your feet on the floor, and your hands on the chair arms, lift your bottom up from the seat, and lock your elbows.  Keeping your elbows straight, allow gravity to pull your body weight down. Your shoulders will rise toward your ears.  Raise your body against gravity by drawing your shoulder blades down your back, shortening the distance between your shoulders and ears. Although your feet should always maintain contact with the floor, your feet should progressively support less body weight, as you get stronger.  Hold for __________ seconds. In a controlled and slow manner, lower your body weight to begin the next repetition. Repeat __________ times. Complete this exercise __________ times per day.    This information is not intended to replace advice given to you by your health care provider. Make sure you discuss any questions you have with your health care provider.   Document Released: 06/10/2005 Document Revised: 07/01/2014 Document Reviewed: 09/22/2008 Elsevier Interactive Patient Education [employer].        By signing my name below, I, Zola Button, attest that this documentation has been  prepared under the direction and in the presence of [name], MD.  Electronically Signed: Zola Button, Medical Scribe. 05/01/2015. 11:15 AM.

## 2015-05-08 ENCOUNTER — Encounter: Payer: Self-pay | Admitting: Family Medicine

## 2015-05-08 ENCOUNTER — Ambulatory Visit (INDEPENDENT_AMBULATORY_CARE_PROVIDER_SITE_OTHER): Payer: BLUE CROSS/BLUE SHIELD | Admitting: Family Medicine

## 2015-05-08 VITALS — BP 134/86 | HR 63 | Temp 98.5°F | Resp 16 | Ht 72.5 in | Wt 201.6 lb

## 2015-05-08 DIAGNOSIS — R739 Hyperglycemia, unspecified: Secondary | ICD-10-CM

## 2015-05-08 DIAGNOSIS — R519 Headache, unspecified: Secondary | ICD-10-CM

## 2015-05-08 DIAGNOSIS — R51 Headache: Secondary | ICD-10-CM

## 2015-05-08 DIAGNOSIS — R42 Dizziness and giddiness: Secondary | ICD-10-CM | POA: Diagnosis not present

## 2015-05-08 DIAGNOSIS — I10 Essential (primary) hypertension: Secondary | ICD-10-CM | POA: Diagnosis not present

## 2015-05-08 LAB — CBC
HEMATOCRIT: 39.8 % (ref 39.0–52.0)
Hemoglobin: 13.1 g/dL (ref 13.0–17.0)
MCH: 27.8 pg (ref 26.0–34.0)
MCHC: 32.9 g/dL (ref 30.0–36.0)
MCV: 84.3 fL (ref 78.0–100.0)
MPV: 9.6 fL (ref 8.6–12.4)
Platelets: 273 10*3/uL (ref 150–400)
RBC: 4.72 MIL/uL (ref 4.22–5.81)
RDW: 14.1 % (ref 11.5–15.5)
WBC: 4.1 10*3/uL (ref 4.0–10.5)

## 2015-05-08 LAB — GLUCOSE, POCT (MANUAL RESULT ENTRY): POC Glucose: 132 mg/dl — AB (ref 70–99)

## 2015-05-08 NOTE — Progress Notes (Signed)
Subjective:    Patient ID: Brendan Gonzales, male    DOB: 07-07-1955, 59 y.o.   MRN: [id number] This chart was scribed for [name], MD by Zola Button, Medical Scribe. This patient was seen in Room 27 and the patient's care was started at 4:55 PM.    HPI HPI Comments: Brendan Gonzales is a 59 y.o. male with a history of hypertension and type II DM who presents to the [facility] for a follow-up. See visit last week. Multiple concerns addressed then, but one was elevated blood pressures at home.   Hypertension: Home readings did not seem to correlate with our readings in the office. His blood pressure was overall controlled at that visit. He had a normal TSH. Electrolytes overall okay except for glucose. Borderline hemoglobin at 12.9. He brought his home machine today to correlate with our readings. His home blood pressure readings, using his left wrist machine, has been in the 145-171/97-112 range.  Bradycardia: He had bradycardia at last visit, so we stopped his metoprolol. His EKG at last visit, sinus bradycardia with a 1st degree block.   Dizziness: He was having dizziness last week. As above, tried discontinuing his beta-blocker. He was not orthostatic at last visit. TSH and BMP were normal except for slight hyperglycemia. Borderline creatinine, borderline hemoglobin. His dizziness has improved overall.   Headaches: Patient has been having intermittent headaches for the past 2 years, but worsened last week. He has been having daily headaches for the past week. He has been taking Tylenol 2-3 times a day which provides relief only sometimes. He was referred to a headache specialist previously, but canceled his appointment because he had been feeling better. His blood sugar was 235 yesterday. Patient denies blood sugar readings below 60s/70s. He has noticed his headaches mostly when his blood sugar is elevated. Patient notes that he works around a lot of noise, but does wear  headphones. He reports drinking water throughout the day.   Patient Active Problem List   Diagnosis Date Noted  . Eczema of hand 10/19/2014  . Type 2 diabetes mellitus ([place]) 06/13/2013  . HTN (hypertension) 06/13/2013  . Normal coronary arteries- 2008 06/13/2013  . Chest pain with moderate risk of acute coronary syndrome 06/12/2013  . Special screening for malignant neoplasms, Brendan 06/27/2011  . Arthritis of knee 06/14/2011   Past Medical History  Diagnosis Date  . Hypertension     benign  . Diabetes mellitus     type 2  . Arthritis   . Headache(784.0)   . Hyperlipidemia   . Constipation   . Dizziness    Past Surgical History  Procedure Laterality Date  . Colonoscopy    . Right femeroral bypass    . Right knee surgery    . Coronary angioplasty    . Hemorrhoid surgery     No Known Allergies Prior to Admission medications   Medication Sig Start Date End Date Taking? Authorizing Provider  acetaminophen (TYLENOL) 500 MG tablet Take 500 mg by mouth every 6 (six) hours as needed for mild pain.     Historical Provider, MD  aspirin 81 MG tablet Take 81 mg by mouth every morning.     Historical Provider, MD  atorvastatin (LIPITOR) 10 MG tablet TAKE ONE TABLET BY MOUTH ONCE DAILY 05/01/15   [name], MD  Cyanocobalamin (VITAMIN B 12 PO) Take 1,000 mg by mouth daily.    Historical Provider, MD  cyclobenzaprine (FLEXERIL) 10 MG tablet Take  1 tablet (10 mg total) by mouth 3 (three) times daily as needed for muscle spasms. 01/29/15   Dalia Heading, PA-C  fluticasone (FLONASE) 50 MCG/ACT nasal spray Place 2 sprays into both nostrils daily. Patient taking differently: Place 2 sprays into both nostrils daily as needed for allergies.  10/17/14   [name], MD  GARLIC PO Take 1 capsule by mouth every morning.    Historical Provider, MD  hydrochlorothiazide (HYDRODIURIL) 25 MG tablet Take 1 tablet (25 mg total) by mouth every morning. 10/17/14   [name], MD    HYDROcodone-acetaminophen (NORCO/VICODIN) 5-325 MG per tablet Take 1 tablet by mouth every 6 (six) hours as needed for moderate pain. Patient not taking: Reported on 05/01/2015 01/29/15   Dalia Heading, PA-C  lisinopril (PRINIVIL,ZESTRIL) 20 MG tablet TAKE ONE TABLET BY MOUTH IN THE MORNING 03/07/14   [name], MD  lisinopril-hydrochlorothiazide (PRINZIDE,ZESTORETIC) 20-25 MG tablet Take 1 tablet by mouth daily. 05/01/15   [name], MD  meloxicam (MOBIC) 7.5 MG tablet TAKE ONE TABLET BY MOUTH ONCE DAILY 08/05/14   [name], MD  metFORMIN (GLUCOPHAGE) 500 MG tablet Take 2 tabs by mouth in morning with meal, take one tab by mouth in the evening with meal Patient taking differently: Take 500-1,000 mg by mouth 2 (two) times daily with a meal. Take 2 tabs by mouth in morning with meal, take one tab by mouth in the evening with meal 10/17/14   [name], MD  Multiple Vitamin (MULTIVITAMIN WITH MINERALS) TABS tablet Take 1 tablet by mouth daily.    Historical Provider, MD  pantoprazole (PROTONIX) 40 MG tablet Take 1 tablet (40 mg total) by mouth daily. 10/17/14   [name], MD  triamcinolone cream (KENALOG) 0.1 % Apply 1 application topically 2 (two) times daily. 10/17/14   [name], MD   Social History   Social History  . Marital Status: Married    Spouse Name: N/A  . Number of Children: N/A  . Years of Education: N/A   Occupational History  . Not on file.   Social History Main Topics  . Smoking status: Former Research scientist (life sciences)  . Smokeless tobacco: Not on file  . Alcohol Use: No  . Drug Use: No  . Sexual Activity: Not on file   Other Topics Concern  . Not on file   Social History Narrative     Review of Systems  Neurological: Positive for dizziness and headaches.       Objective:   Physical Exam  Constitutional: He is oriented to person, place, and time. He appears well-developed and well-nourished.  HENT:  Head: Normocephalic and atraumatic.   Eyes: EOM are normal. Pupils are equal, round, and reactive to light. Right eye exhibits no nystagmus. Left eye exhibits no nystagmus.  Neck: No JVD present. Carotid bruit is not present.  Cardiovascular: Normal rate, regular rhythm and normal heart sounds.   No murmur heard. His blood pressure today using his left wrist machine is 169/117. Manual blood pressure today, sitting, is 134/86.  Pulmonary/Chest: Effort normal and breath sounds normal. He has no rales.  Musculoskeletal: He exhibits no edema.  Neurological: He is alert and oriented to person, place, and time. No cranial nerve deficit. He displays a negative Romberg sign.  No focal weakness. No pronator drift.  Skin: Skin is warm and dry.  Psychiatric: He has a normal mood and affect.  Vitals reviewed.     Filed Vitals:  05/08/15 1615  BP: 134/86  Pulse: 63  Temp: 98.5 F (36.9 C)  TempSrc: Oral  Resp: 16  Height: 6' 0.5" (1.842 m)  Weight: 201 lb 9.6 oz (91.445 kg)  SpO2: 98%    Results for orders placed or performed in visit on 05/08/15  CBC  Result Value Ref Range   WBC 4.1 4.0 - 10.5 K/uL   RBC 4.72 4.22 - 5.81 MIL/uL   Hemoglobin 13.1 13.0 - 17.0 g/dL   HCT 39.8 39.0 - 52.0 %   MCV 84.3 78.0 - 100.0 fL   MCH 27.8 26.0 - 34.0 pg   MCHC 32.9 30.0 - 36.0 g/dL   RDW 14.1 11.5 - 15.5 %   Platelets 273 150 - 400 K/uL   MPV 9.6 8.6 - 12.4 fL  POCT glucose (manual entry)  Result Value Ref Range   POC Glucose 132 (A) 70 - 99 mg/dl       Assessment & Plan:  Brendan Gonzales is a 59 y.o. male Headache, unspecified headache type  -suspect stress component, but as also noting HA with elevated blood sugars, will increase to 1014m at night on metformin. Stress mgt. principles discussed as below.   Dizziness - Plan: CBC reassuring. rtc precautions.   Essential hypertension  -home monitor was checked in office and unreliable readings. Advised to not use monitor further. No changes in meds for now.   Hyperglycemia  - Plan: POCT glucose (manual entry)  -as above - increase metformin to 10061mBID.   No orders of the defined types were placed in this encounter.   Patient Instructions  Increase your metformin to 2 pills at night as well (2 in the morning and night).  blood pressure ok today. Do not take metoprolol. Continue other blood pressure medications.  Tylenol ok for headaches. See other information below on stress management as this may also be causing some headaches. If these do not improve in next two weeks - I can refer you to headache specialist.   Return to the clinic or go to the nearest emergency room if any of your symptoms worsen or new symptoms occur.  Stress and Stress Management Stress is a normal reaction to life events. It is what you feel when life demands more than you are used to or more than you can handle. Some stress can be useful. For example, the stress reaction can help you catch the last bus of the day, study for a test, or meet a deadline at work. But stress that occurs too often or for too long can cause problems. It can affect your emotional health and interfere with relationships and normal daily activities. Too much stress can weaken your immune system and increase your risk for physical illness. If you already have a medical problem, stress can make it worse. CAUSES  All sorts of life events may cause stress. An event that causes stress for one person may not be stressful for another person. Major life events commonly cause stress. These may be positive or negative. Examples include losing your job, moving into a new home, getting married, having a baby, or losing a loved one. Less obvious life events may also cause stress, especially if they occur day after day or in combination. Examples include working long hours, driving in traffic, caring for children, being in debt, or being in a difficult relationship. SIGNS AND SYMPTOMS Stress may cause emotional symptoms including,  the following:  Anxiety. This is feeling worried, afraid, on edge,  overwhelmed, or out of control.  Anger. This is feeling irritated or impatient.  Depression. This is feeling sad, down, helpless, or guilty.  Difficulty focusing, remembering, or making decisions. Stress may cause physical symptoms, including the following:   Aches and pains. These may affect your head, neck, back, stomach, or other areas of your body.  Tight muscles or clenched jaw.  Low energy or trouble sleeping. Stress may cause unhealthy behaviors, including the following:   Eating to feel better (overeating) or skipping meals.  Sleeping too little, too much, or both.  Working too much or putting off tasks (procrastination).  Smoking, drinking alcohol, or using drugs to feel better. DIAGNOSIS  Stress is diagnosed through an assessment by your health care provider. Your health care provider will ask questions about your symptoms and any stressful life events.Your health care provider will also ask about your medical history and may order blood tests or other tests. Certain medical conditions and medicine can cause physical symptoms similar to stress. Mental illness can cause emotional symptoms and unhealthy behaviors similar to stress. Your health care provider may refer you to a mental health professional for further evaluation.  TREATMENT  Stress management is the recommended treatment for stress.The goals of stress management are reducing stressful life events and coping with stress in healthy ways.  Techniques for reducing stressful life events include the following:  Stress identification. Self-monitor for stress and identify what causes stress for you. These skills may help you to avoid some stressful events.  Time management. Set your priorities, keep a calendar of events, and learn to say "no." These tools can help you avoid making too many commitments. Techniques for coping with stress include the  following:  Rethinking the problem. Try to think realistically about stressful events rather than ignoring them or overreacting. Try to find the positives in a stressful situation rather than focusing on the negatives.  Exercise. Physical exercise can release both physical and emotional tension. The key is to find a form of exercise you enjoy and do it regularly.  Relaxation techniques. These relax the body and mind. Examples include yoga, meditation, tai chi, biofeedback, deep breathing, progressive muscle relaxation, listening to music, being out in nature, journaling, and other hobbies. Again, the key is to find one or more that you enjoy and can do regularly.  Healthy lifestyle. Eat a balanced diet, get plenty of sleep, and do not smoke. Avoid using alcohol or drugs to relax.  Strong support network. Spend time with family, friends, or other people you enjoy being around.Express your feelings and talk things over with someone you trust. Counseling or talktherapy with a mental health professional may be helpful if you are having difficulty managing stress on your own. Medicine is typically not recommended for the treatment of stress.Talk to your health care provider if you think you need medicine for symptoms of stress. HOME CARE INSTRUCTIONS  Keep all follow-up visits as directed by your health care provider.  Take all medicines as directed by your health care provider. SEEK MEDICAL CARE IF:  Your symptoms get worse or you start having new symptoms.  You feel overwhelmed by your problems and can no longer manage them on your own. SEEK IMMEDIATE MEDICAL CARE IF:  You feel like hurting yourself or someone else.   This information is not intended to replace advice given to you by your health care provider. Make sure you discuss any questions you have with your health care provider.   Document Released:  12/04/2000 Document Revised: 07/01/2014 Document Reviewed: 02/02/2013 Elsevier  Interactive Patient Education [employer].   I personally performed the services described in this documentation, which was scribed in my presence. The recorded information has been reviewed and considered, and addended by me as needed.   By signing my name below, I, Zola Button, attest that this documentation has been prepared under the direction and in the presence of [name], MD.  Electronically Signed: Zola Button, Medical Scribe. 05/08/2015. 4:55 PM.

## 2015-05-08 NOTE — Patient Instructions (Signed)
Increase your metformin to 2 pills at night as well (2 in the morning and night).  blood pressure ok today. Do not take metoprolol. Continue other blood pressure medications.  Tylenol ok for headaches. See other information below on stress management as this may also be causing some headaches. If these do not improve in next two weeks - I can refer you to headache specialist.   Return to the clinic or go to the nearest emergency room if any of your symptoms worsen or new symptoms occur.  Stress and Stress Management Stress is a normal reaction to life events. It is what you feel when life demands more than you are used to or more than you can handle. Some stress can be useful. For example, the stress reaction can help you catch the last bus of the day, study for a test, or meet a deadline at work. But stress that occurs too often or for too long can cause problems. It can affect your emotional health and interfere with relationships and normal daily activities. Too much stress can weaken your immune system and increase your risk for physical illness. If you already have a medical problem, stress can make it worse. CAUSES  All sorts of life events may cause stress. An event that causes stress for one person may not be stressful for another person. Major life events commonly cause stress. These may be positive or negative. Examples include losing your job, moving into a new home, getting married, having a baby, or losing a loved one. Less obvious life events may also cause stress, especially if they occur day after day or in combination. Examples include working long hours, driving in traffic, caring for children, being in debt, or being in a difficult relationship. SIGNS AND SYMPTOMS Stress may cause emotional symptoms including, the following:  Anxiety. This is feeling worried, afraid, on edge, overwhelmed, or out of control.  Anger. This is feeling irritated or impatient.  Depression. This is  feeling sad, down, helpless, or guilty.  Difficulty focusing, remembering, or making decisions. Stress may cause physical symptoms, including the following:   Aches and pains. These may affect your head, neck, back, stomach, or other areas of your body.  Tight muscles or clenched jaw.  Low energy or trouble sleeping. Stress may cause unhealthy behaviors, including the following:   Eating to feel better (overeating) or skipping meals.  Sleeping too little, too much, or both.  Working too much or putting off tasks (procrastination).  Smoking, drinking alcohol, or using drugs to feel better. DIAGNOSIS  Stress is diagnosed through an assessment by your health care provider. Your health care provider will ask questions about your symptoms and any stressful life events.Your health care provider will also ask about your medical history and may order blood tests or other tests. Certain medical conditions and medicine can cause physical symptoms similar to stress. Mental illness can cause emotional symptoms and unhealthy behaviors similar to stress. Your health care provider may refer you to a mental health professional for further evaluation.  TREATMENT  Stress management is the recommended treatment for stress.The goals of stress management are reducing stressful life events and coping with stress in healthy ways.  Techniques for reducing stressful life events include the following:  Stress identification. Self-monitor for stress and identify what causes stress for you. These skills may help you to avoid some stressful events.  Time management. Set your priorities, keep a calendar of events, and learn to say "no." These tools  can help you avoid making too many commitments. Techniques for coping with stress include the following:  Rethinking the problem. Try to think realistically about stressful events rather than ignoring them or overreacting. Try to find the positives in a stressful  situation rather than focusing on the negatives.  Exercise. Physical exercise can release both physical and emotional tension. The key is to find a form of exercise you enjoy and do it regularly.  Relaxation techniques. These relax the body and mind. Examples include yoga, meditation, tai chi, biofeedback, deep breathing, progressive muscle relaxation, listening to music, being out in nature, journaling, and other hobbies. Again, the key is to find one or more that you enjoy and can do regularly.  Healthy lifestyle. Eat a balanced diet, get plenty of sleep, and do not smoke. Avoid using alcohol or drugs to relax.  Strong support network. Spend time with family, friends, or other people you enjoy being around.Express your feelings and talk things over with someone you trust. Counseling or talktherapy with a mental health professional may be helpful if you are having difficulty managing stress on your own. Medicine is typically not recommended for the treatment of stress.Talk to your health care provider if you think you need medicine for symptoms of stress. HOME CARE INSTRUCTIONS  Keep all follow-up visits as directed by your health care provider.  Take all medicines as directed by your health care provider. SEEK MEDICAL CARE IF:  Your symptoms get worse or you start having new symptoms.  You feel overwhelmed by your problems and can no longer manage them on your own. SEEK IMMEDIATE MEDICAL CARE IF:  You feel like hurting yourself or someone else.   This information is not intended to replace advice given to you by your health care provider. Make sure you discuss any questions you have with your health care provider.   Document Released: 12/04/2000 Document Revised: 07/01/2014 Document Reviewed: 02/02/2013 Elsevier Interactive Patient Education [employer].

## 2015-05-25 ENCOUNTER — Encounter: Payer: Self-pay | Admitting: Family Medicine

## 2015-06-20 ENCOUNTER — Other Ambulatory Visit: Payer: Self-pay | Admitting: Family Medicine

## 2015-06-22 ENCOUNTER — Telehealth: Payer: Self-pay | Admitting: Physician Assistant

## 2015-06-22 NOTE — Telephone Encounter (Signed)
I obtained the form from the nurses' box and have reviewed the chart. I don't see that I have ever seen the patient, but that his last 2 visits were with Dr. Janeann Forehand.  I suspect that he may need this for arthritis of the knee, but I see that on the problem list. It was not evaluated/addressed at his 2 most recent visits.  I also do not see a previous form to guide me.  I will return the form to the box at the nurses' desk and forward this message to Dr. Carlota Raspberry.  Perhaps a clinical staff person could contact the patient to obtain more details in the interim.

## 2015-06-22 NOTE — Telephone Encounter (Signed)
Patient request for Chelle to complete form for Disability Parking Placard. Patient need form completed by 06-23-2015. Please call patient when form is ready [phone]. I will place the form in the nurse's box at 102. Thanks.

## 2015-06-23 NOTE — Telephone Encounter (Signed)
Spoke with pt, he states it is for his knees.

## 2015-06-28 ENCOUNTER — Telehealth: Payer: Self-pay | Admitting: Family Medicine

## 2015-06-28 NOTE — Telephone Encounter (Signed)
Pt walked into clinic wanting his paper work filled or he was going to another practice to get another provider to fill out paper work let for Dr Carlota Raspberry

## 2015-08-14 ENCOUNTER — Other Ambulatory Visit: Payer: Self-pay | Admitting: Family Medicine

## 2015-10-02 ENCOUNTER — Ambulatory Visit (INDEPENDENT_AMBULATORY_CARE_PROVIDER_SITE_OTHER): Payer: BLUE CROSS/BLUE SHIELD | Admitting: Family Medicine

## 2015-10-02 VITALS — BP 120/72 | HR 65 | Temp 98.4°F | Resp 16 | Ht 72.5 in | Wt 202.6 lb

## 2015-10-02 DIAGNOSIS — IMO0001 Reserved for inherently not codable concepts without codable children: Secondary | ICD-10-CM

## 2015-10-02 DIAGNOSIS — K219 Gastro-esophageal reflux disease without esophagitis: Secondary | ICD-10-CM | POA: Diagnosis not present

## 2015-10-02 DIAGNOSIS — E785 Hyperlipidemia, unspecified: Secondary | ICD-10-CM

## 2015-10-02 DIAGNOSIS — I10 Essential (primary) hypertension: Secondary | ICD-10-CM

## 2015-10-02 DIAGNOSIS — E1165 Type 2 diabetes mellitus with hyperglycemia: Secondary | ICD-10-CM

## 2015-10-02 LAB — MICROALBUMIN, URINE: Microalb, Ur: 3.9 mg/dL

## 2015-10-02 LAB — GLUCOSE, POCT (MANUAL RESULT ENTRY): POC GLUCOSE: 131 mg/dL — AB (ref 70–99)

## 2015-10-02 LAB — POCT GLYCOSYLATED HEMOGLOBIN (HGB A1C): HEMOGLOBIN A1C: 7.1

## 2015-10-02 MED ORDER — ATORVASTATIN CALCIUM 10 MG PO TABS: ORAL_TABLET | ORAL | Status: DC

## 2015-10-02 MED ORDER — PANTOPRAZOLE SODIUM 40 MG PO TBEC: 40.0000 mg | DELAYED_RELEASE_TABLET | Freq: Every day | ORAL | Status: DC

## 2015-10-02 MED ORDER — METFORMIN HCL 500 MG PO TABS: ORAL_TABLET | ORAL | Status: DC

## 2015-10-02 MED ORDER — SUCRALFATE 1 GM/10ML PO SUSP: 1.0000 g | Freq: Three times a day (TID) | ORAL | Status: DC

## 2015-10-02 MED ORDER — GI COCKTAIL ~~LOC~~
30.0000 mL | Freq: Once | ORAL | Status: AC
  Administered 2015-10-02: 30 mL via ORAL

## 2015-10-02 MED ORDER — LISINOPRIL-HYDROCHLOROTHIAZIDE 20-25 MG PO TABS: 1.0000 | ORAL_TABLET | Freq: Every day | ORAL | Status: DC

## 2015-10-02 NOTE — Progress Notes (Addendum)
By signing my name below, I, Mesha Guiyard, attest that this documentation has been prepared under the direction and in the presence of [name], MD.  Electronically Signed: [name], Medical Scribe. 10/02/2015. 11:16 AM.  Subjective:    Patient ID: Brendan Gonzales, male    DOB: [birth year], 60 y.o.   MRN: [id number]  HPI Chief Complaint  Patient presents with  . Gastroesophageal Reflux    worsen with increase chest discomfort   . Hyperlipidemia    pt would like lipid panel done today if possible. Pt is fasting     HPI Comments: Brendan Gonzales is a 60 y.o. male who presents to the [facility] complaining of heartburn. He has a hx of DM and hypertension, hyperlipidemia. His last visit was in November with a plan of rechecking in 3 months.  Pt states his heartburn started three weeks ago. He reports constant buring pain that starts in the center of his chest and radiates up to his neck. He states pain is worse with black tea, coffee, and with eating. He reports relief with fasting.  He has tried TUMS TID along with over the counter ginger supplements, and protonix every day with no relief. He denies SOB, and trouble breathing. No radiation to arm. Some soreness to touch.     DM Lab Results  Component Value Date   HGBA1C 7.7 05/01/2015   He takes metformin 1000 mg in the morning, 500 mg at night. Pt reports at home readings are 108 at its lowest, and 225 at the highest. No symptomatic lows. He is tolerating his medications well. Last ophtho check up was two weeks ago, and no DM changes.  Lab Results  Component Value Date   MICROALBUR 0.9 07/18/2014    Hyperlipidemia  Lab Results  Component Value Date   CHOL 153 05/01/2015   HDL 38* 05/01/2015   LDLCALC 77 05/01/2015   TRIG 190* 05/01/2015   CHOLHDL 4.0 05/01/2015   Lab Results  Component Value Date   ALT 13 05/01/2015   AST 16 05/01/2015   ALKPHOS 92 05/01/2015   BILITOT 0.5 05/01/2015    Takes lipitor 10 mg QD. He  denies side affects with medication, including muscle aches.  Hypertension  Lab Results  Component Value Date   CREATININE 0.61* 05/01/2015   Takes lisinopril/HCTZ 20/25 mg QD.  Pt is requesting refills of all medications  Patient Active Problem List   Diagnosis Date Noted  . Eczema of hand 10/19/2014  . Type 2 diabetes mellitus ([place]) 06/13/2013  . HTN (hypertension) 06/13/2013  . Normal coronary arteries- 2008 06/13/2013  . Chest pain with moderate risk of acute coronary syndrome 06/12/2013  . Special screening for malignant neoplasms, colon 06/27/2011  . Arthritis of knee 06/14/2011   Past Medical History  Diagnosis Date  . Hypertension     benign  . Diabetes mellitus     type 2  . Arthritis   . Headache(784.0)   . Hyperlipidemia   . Constipation   . Dizziness    Past Surgical History  Procedure Laterality Date  . Colonoscopy    . Right femeroral bypass    . Right knee surgery    . Coronary angioplasty    . Hemorrhoid surgery     No Known Allergies Prior to Admission medications   Medication Sig Start Date End Date Taking? Authorizing Provider  acetaminophen (TYLENOL) 500 MG tablet Take 500 mg by mouth every 6 (six) hours as needed for  mild pain.    Yes Historical Provider, MD  aspirin 81 MG tablet Take 81 mg by mouth every morning.    Yes Historical Provider, MD  atorvastatin (LIPITOR) 10 MG tablet TAKE ONE TABLET BY MOUTH ONCE DAILY 05/01/15  Yes [name], MD  cetirizine (ZYRTEC) 10 MG tablet Take 10 mg by mouth daily.   Yes Historical Provider, MD  Cyanocobalamin (VITAMIN B 12 PO) Take 1,000 mg by mouth daily.   Yes Historical Provider, MD  fluticasone (FLONASE) 50 MCG/ACT nasal spray Place 2 sprays into both nostrils daily. Patient taking differently: Place 2 sprays into both nostrils daily as needed for allergies.  10/17/14  Yes [name], MD  GARLIC PO Take 1 capsule by mouth every morning.   Yes Historical  Provider, MD  hydrochlorothiazide (HYDRODIURIL) 25 MG tablet Take 1 tablet (25 mg total) by mouth every morning. 10/17/14  Yes [name], MD  lisinopril (PRINIVIL,ZESTRIL) 20 MG tablet TAKE ONE TABLET BY MOUTH IN THE MORNING 03/07/14  Yes [name], MD  metFORMIN (GLUCOPHAGE) 500 MG tablet TAKE TWO TABLETS BY MOUTH IN THE MORNING WITH A MEAL AND ONE TABLET IN THE EVENING WITH A MEAL. 08/15/15  Yes [name], MD  Multiple Vitamin (MULTIVITAMIN WITH MINERALS) TABS tablet Take 1 tablet by mouth daily.   Yes Historical Provider, MD  pantoprazole (PROTONIX) 40 MG tablet TAKE ONE TABLET BY MOUTH ONCE DAILY 06/23/15  Yes [name], MD  triamcinolone cream (KENALOG) 0.1 % Apply 1 application topically 2 (two) times daily. 10/17/14  Yes [name], MD  HYDROcodone-acetaminophen (NORCO/VICODIN) 5-325 MG per tablet Take 1 tablet by mouth every 6 (six) hours as needed for moderate pain. Patient not taking: Reported on 10/02/2015 01/29/15   Dalia Heading, PA-C   Social History   Social History  . Marital Status: Married    Spouse Name: N/A  . Number of Children: N/A  . Years of Education: N/A   Occupational History  . Not on file.   Social History Main Topics  . Smoking status: Former Research scientist (life sciences)  . Smokeless tobacco: Not on file  . Alcohol Use: No  . Drug Use: No  . Sexual Activity: Not on file   Other Topics Concern  . Not on file   Social History Narrative   Review of Systems  Respiratory: Negative for apnea and shortness of breath.   Gastrointestinal:       Heartburn      Objective:   Physical Exam  Constitutional: He is oriented to person, place, and time. He appears well-developed and well-nourished.  HENT:  Head: Normocephalic and atraumatic.  Eyes: EOM are normal. Pupils are equal, round, and reactive to light.  Neck: No JVD present. Carotid bruit is not present.  Cardiovascular: Normal rate, regular rhythm and normal heart sounds.   No murmur  heard. Pulmonary/Chest: Effort normal and breath sounds normal. He has no rales.  Chest wall- reproducible tenderness with palpations  Musculoskeletal: He exhibits no edema.  Neurological: He is alert and oriented to person, place, and time.  Skin: Skin is warm and dry.  Psychiatric: He has a normal mood and affect.  Vitals reviewed.   Filed Vitals:   10/02/15 1036  BP: 120/72  Pulse: 65  Temp: 98.4 F (36.9 C)  TempSrc: Oral  Resp: 16  Height: 6' 0.5" (1.842 m)  Weight: 202 lb 9.6 oz (91.899 kg)  SpO2: 98%   Results for orders placed or performed  in visit on 10/02/15  POCT glucose (manual entry)  Result Value Ref Range   POC Glucose 131 (A) 70 - 99 mg/dl  POCT glycosylated hemoglobin (Hb A1C)  Result Value Ref Range   Hemoglobin A1C 7.1     30 mL GI cocktail given. Chest symptoms resolved with GI cocktail. He denies any further discomfort.    Assessment & Plan:   Brendan Gonzales is a 60 y.o. male Uncontrolled type 2 diabetes mellitus without complication, without long-term current use of insulin ([place]) - Plan: Microalbumin, urine, POCT glucose (manual entry), POCT glycosylated hemoglobin (Hb A1C), metFORMIN (GLUCOPHAGE) 500 MG tablet  -  Uncontrolled, but borderline. We'll continue metformin 1000 mg in the morning, 500 mg at night. Continue to watch diet. Recheck in 3 months. Urine microalbumin pending.  Essential hypertension - Plan: COMPLETE METABOLIC PANEL WITH GFR, lisinopril-hydrochlorothiazide (PRINZIDE,ZESTORETIC) 20-25 MG tablet  - Stable. Change to combination pill of lisinopril HCTZ, but same total doses.. Labs pending.  Gastroesophageal reflux disease, esophagitis presence not specified - Plan: gi cocktail (Maalox,Lidocaine,Donnatal), pantoprazole (PROTONIX) 40 MG tablet, Ambulatory referral to Gastroenterology, sucralfate (CARAFATE) 1 GM/10ML suspension  - Relief of chest pain with GI cocktail, suspected esophageal source.  -continue Protonix daily, add Carafate  with meals, refer to gastroenterology. Avoidance of trigger foods.  -RTC precautions if any different chest pain or not relieved with treatment above.   Hyperlipidemia - Plan: COMPLETE METABOLIC PANEL WITH GFR, Lipid panel, atorvastatin (LIPITOR) 10 MG tablet  -Continue Lipitor 10 mg daily, labs pending.  Meds ordered this encounter  Medications  . cetirizine (ZYRTEC) 10 MG tablet    Sig: Take 10 mg by mouth daily.  Marland Kitchen gi cocktail (Maalox,Lidocaine,Donnatal)    Sig:   . lisinopril-hydrochlorothiazide (PRINZIDE,ZESTORETIC) 20-25 MG tablet    Sig: Take 1 tablet by mouth daily.    Dispense:  90 tablet    Refill:  1  . pantoprazole (PROTONIX) 40 MG tablet    Sig: Take 1 tablet (40 mg total) by mouth daily.    Dispense:  90 tablet    Refill:  1  . metFORMIN (GLUCOPHAGE) 500 MG tablet    Sig: TAKE TWO TABLETS BY MOUTH IN THE MORNING WITH A MEAL AND ONE TABLET IN THE EVENING WITH A MEAL.    Dispense:  135 tablet    Refill:  1  . atorvastatin (LIPITOR) 10 MG tablet    Sig: TAKE ONE TABLET BY MOUTH ONCE DAILY    Dispense:  90 tablet    Refill:  1  . sucralfate (CARAFATE) 1 GM/10ML suspension    Sig: Take 10 mLs (1 g total) by mouth 4 (four) times daily -  with meals and at bedtime.    Dispense:  420 mL    Refill:  0   Patient Instructions       IF you received an x-ray today, you will receive an invoice from [facility]. Please contact [facility] at [phone] with questions or concerns regarding your invoice.   IF you received labwork today, you will receive an invoice from [employer]. Please contact [facility] at [phone] with questions or concerns regarding your invoice.   Our billing staff will not be able to assist you with questions regarding bills from these companies.  You will be contacted with the lab results as soon as they are available. The fastest way to get your results is to activate your My Chart account.  Instructions are located on the last page of this  paperwork. If you have not heard from [place] regarding the results in 2 weeks, please contact this office.    Start Carafate and continue your Protonix for heartburn. I will refer you to a gastroenterologist, but in the meantime, avoid the foods listed below that can worsen heartburn.. If the chest pain returns and is not improved with heartburn treatment, recommend recheck here or emergency room if needed.  No change in diabetes, blood pressure doses, or cholesterol medication at this time. I did send in the combination pill for your blood pressure. Follow-up with me in 3 months.  Return to the clinic or go to the nearest emergency room if any of your symptoms worsen or new symptoms occur.  Food Choices for Gastroesophageal Reflux Disease, Adult When you have gastroesophageal reflux disease (GERD), the foods you eat and your eating habits are very important. Choosing the right foods can help ease the discomfort of GERD. WHAT GENERAL GUIDELINES DO I NEED TO FOLLOW?  Choose fruits, vegetables, whole grains, low-fat dairy products, and low-fat meat, fish, and poultry.  Limit fats such as oils, salad dressings, butter, nuts, and avocado.  Keep a food diary to identify foods that cause symptoms.  Avoid foods that cause reflux. These may be different for different people.  Eat frequent small meals instead of three large meals each day.  Eat your meals slowly, in a relaxed setting.  Limit fried foods.  Cook foods using methods other than frying.  Avoid drinking alcohol.  Avoid drinking large amounts of liquids with your meals.  Avoid bending over or lying down until 2-3 hours after eating. WHAT FOODS ARE NOT RECOMMENDED? The following are some foods and drinks that may worsen your symptoms: Vegetables Tomatoes. Tomato juice. Tomato and spaghetti sauce. Chili peppers. Onion and garlic. Horseradish. Fruits Oranges, grapefruit, and lemon  (fruit and juice). Meats High-fat meats, fish, and poultry. This includes hot dogs, ribs, ham, sausage, salami, and bacon. Dairy Whole milk and chocolate milk. Sour cream. Cream. Butter. Ice cream. Cream cheese.  Beverages Coffee and tea, with or without caffeine. Carbonated beverages or energy drinks. Condiments Hot sauce. Barbecue sauce.  Sweets/Desserts Chocolate and cocoa. Donuts. Peppermint and spearmint. Fats and Oils High-fat foods, including [place] fries and potato chips. Other Vinegar. Strong spices, such as black pepper, white pepper, red pepper, cayenne, curry powder, cloves, ginger, and chili powder. The items listed above may not be a complete list of foods and beverages to avoid. Contact your dietitian for more information.   This information is not intended to replace advice given to you by your health care provider. Make sure you discuss any questions you have with your health care provider.   Document Released: 06/10/2005 Document Revised: 07/01/2014 Document Reviewed: 04/14/2013 Elsevier Interactive Patient Education [employer].         I personally performed the services described in this documentation, which was scribed in my presence. The recorded information has been reviewed and considered, and addended by me as needed.

## 2015-10-02 NOTE — Patient Instructions (Addendum)
     IF you received an x-ray today, you will receive an invoice from [facility]. Please contact [facility] at [phone] with questions or concerns regarding your invoice.   IF you received labwork today, you will receive an invoice from [employer]. Please contact [facility] at [phone] with questions or concerns regarding your invoice.   Our billing staff will not be able to assist you with questions regarding bills from these companies.  You will be contacted with the lab results as soon as they are available. The fastest way to get your results is to activate your My Chart account. Instructions are located on the last page of this paperwork. If you have not heard from [place] regarding the results in 2 weeks, please contact this office.    Start Carafate and continue your Protonix for heartburn. I will refer you to a gastroenterologist, but in the meantime, avoid the foods listed below that can worsen heartburn.. If the chest pain returns and is not improved with heartburn treatment, recommend recheck here or emergency room if needed.  No change in diabetes, blood pressure doses, or cholesterol medication at this time. I did send in the combination pill for your blood pressure. Follow-up with me in 3 months.  Return to the clinic or go to the nearest emergency room if any of your symptoms worsen or new symptoms occur.  Food Choices for Gastroesophageal Reflux Disease, Adult When you have gastroesophageal reflux disease (GERD), the foods you eat and your eating habits are very important. Choosing the right foods can help ease the discomfort of GERD. WHAT GENERAL GUIDELINES DO I NEED TO FOLLOW?  Choose fruits, vegetables, whole grains, low-fat dairy products, and low-fat meat, fish, and poultry.  Limit fats such as oils, salad dressings, butter, nuts, and avocado.  Keep a food diary to identify foods that cause symptoms.  Avoid foods that  cause reflux. These may be different for different people.  Eat frequent small meals instead of three large meals each day.  Eat your meals slowly, in a relaxed setting.  Limit fried foods.  Cook foods using methods other than frying.  Avoid drinking alcohol.  Avoid drinking large amounts of liquids with your meals.  Avoid bending over or lying down until 2-3 hours after eating. WHAT FOODS ARE NOT RECOMMENDED? The following are some foods and drinks that may worsen your symptoms: Vegetables Tomatoes. Tomato juice. Tomato and spaghetti sauce. Chili peppers. Onion and garlic. Horseradish. Fruits Oranges, grapefruit, and lemon (fruit and juice). Meats High-fat meats, fish, and poultry. This includes hot dogs, ribs, ham, sausage, salami, and bacon. Dairy Whole milk and chocolate milk. Sour cream. Cream. Butter. Ice cream. Cream cheese.  Beverages Coffee and tea, with or without caffeine. Carbonated beverages or energy drinks. Condiments Hot sauce. Barbecue sauce.  Sweets/Desserts Chocolate and cocoa. Donuts. Peppermint and spearmint. Fats and Oils High-fat foods, including [place] fries and potato chips. Other Vinegar. Strong spices, such as black pepper, white pepper, red pepper, cayenne, curry powder, cloves, ginger, and chili powder. The items listed above may not be a complete list of foods and beverages to avoid. Contact your dietitian for more information.   This information is not intended to replace advice given to you by your health care provider. Make sure you discuss any questions you have with your health care provider.   Document Released: 06/10/2005 Document Revised: 07/01/2014 Document Reviewed: 04/14/2013 Elsevier Interactive Patient Education [employer].

## 2015-10-03 LAB — COMPLETE METABOLIC PANEL WITH GFR
ALT: 14 U/L (ref 9–46)
AST: 14 U/L (ref 10–35)
Albumin: 4.3 g/dL (ref 3.6–5.1)
Alkaline Phosphatase: 86 U/L (ref 40–115)
BUN: 9 mg/dL (ref 7–25)
CHLORIDE: 102 mmol/L (ref 98–110)
CO2: 30 mmol/L (ref 20–31)
CREATININE: 0.89 mg/dL (ref 0.70–1.33)
Calcium: 9.4 mg/dL (ref 8.6–10.3)
GFR, Est African American: >89 mL/min (ref >=60)
GFR, Est Non African American: >89 mL/min (ref >=60)
Glucose, Bld: 129 mg/dL — ABNORMAL HIGH (ref 65–99)
POTASSIUM: 4 mmol/L (ref 3.5–5.3)
Sodium: 141 mmol/L (ref 135–146)
Total Bilirubin: 0.4 mg/dL (ref 0.2–1.2)
Total Protein: 6.9 g/dL (ref 6.1–8.1)

## 2015-10-03 LAB — LIPID PANEL
CHOL/HDL RATIO: 3.5 ratio (ref <=5.0)
Cholesterol: 144 mg/dL (ref 125–200)
HDL: 41 mg/dL (ref >=40)
LDL CALC: 81 mg/dL (ref <130)
Triglycerides: 109 mg/dL (ref <150)
VLDL: 22 mg/dL (ref <30)

## 2015-10-11 ENCOUNTER — Encounter (HOSPITAL_COMMUNITY): Payer: Self-pay | Admitting: *Deleted

## 2015-10-11 ENCOUNTER — Emergency Department (HOSPITAL_COMMUNITY): Payer: BLUE CROSS/BLUE SHIELD

## 2015-10-11 ENCOUNTER — Emergency Department (HOSPITAL_COMMUNITY)
Admission: EM | Admit: 2015-10-11 | Discharge: 2015-10-11 | Disposition: A | Discharge status: Home / Self Care | Payer: BLUE CROSS/BLUE SHIELD | Attending: Emergency Medicine | Admitting: Emergency Medicine

## 2015-10-11 DIAGNOSIS — E785 Hyperlipidemia, unspecified: Secondary | ICD-10-CM | POA: Insufficient documentation

## 2015-10-11 DIAGNOSIS — I10 Essential (primary) hypertension: Secondary | ICD-10-CM | POA: Diagnosis not present

## 2015-10-11 DIAGNOSIS — K219 Gastro-esophageal reflux disease without esophagitis: Secondary | ICD-10-CM

## 2015-10-11 DIAGNOSIS — Z7984 Long term (current) use of oral hypoglycemic drugs: Secondary | ICD-10-CM | POA: Diagnosis not present

## 2015-10-11 DIAGNOSIS — Z87891 Personal history of nicotine dependence: Secondary | ICD-10-CM | POA: Insufficient documentation

## 2015-10-11 DIAGNOSIS — R0789 Other chest pain: Secondary | ICD-10-CM | POA: Diagnosis not present

## 2015-10-11 DIAGNOSIS — M1990 Unspecified osteoarthritis, unspecified site: Secondary | ICD-10-CM | POA: Insufficient documentation

## 2015-10-11 DIAGNOSIS — E119 Type 2 diabetes mellitus without complications: Secondary | ICD-10-CM | POA: Insufficient documentation

## 2015-10-11 DIAGNOSIS — Z79899 Other long term (current) drug therapy: Secondary | ICD-10-CM | POA: Diagnosis not present

## 2015-10-11 DIAGNOSIS — Z7982 Long term (current) use of aspirin: Secondary | ICD-10-CM | POA: Insufficient documentation

## 2015-10-11 DIAGNOSIS — R079 Chest pain, unspecified: Secondary | ICD-10-CM

## 2015-10-11 LAB — BASIC METABOLIC PANEL
ANION GAP: 8 (ref 5–15)
BUN: 11 mg/dL (ref 6–20)
CO2: 29 mmol/L (ref 22–32)
Calcium: 9 mg/dL (ref 8.9–10.3)
Chloride: 99 mmol/L — ABNORMAL LOW (ref 101–111)
Creatinine, Ser: 0.64 mg/dL (ref 0.61–1.24)
GFR calc Af Amer: >60 mL/min (ref >60)
Glucose, Bld: 107 mg/dL — ABNORMAL HIGH (ref 65–99)
POTASSIUM: 3.6 mmol/L (ref 3.5–5.1)
SODIUM: 136 mmol/L (ref 135–145)

## 2015-10-11 LAB — CBC
HEMATOCRIT: 35.1 % — AB (ref 39.0–52.0)
Hemoglobin: 12 g/dL — ABNORMAL LOW (ref 13.0–17.0)
MCH: 28.1 pg (ref 26.0–34.0)
MCHC: 34.2 g/dL (ref 30.0–36.0)
MCV: 82.2 fL (ref 78.0–100.0)
PLATELETS: 311 10*3/uL (ref 150–400)
RBC: 4.27 MIL/uL (ref 4.22–5.81)
RDW: 13.5 % (ref 11.5–15.5)
WBC: 6.8 10*3/uL (ref 4.0–10.5)

## 2015-10-11 LAB — TROPONIN I

## 2015-10-11 LAB — I-STAT TROPONIN, ED: TROPONIN I, POC: 0 ng/mL (ref 0.00–0.08)

## 2015-10-11 MED ORDER — SUCRALFATE 1 G PO TABS
1.0000 g | ORAL_TABLET | Freq: Once | ORAL | Status: AC
  Administered 2015-10-11: 1 g via ORAL
  Filled 2015-10-11: qty 1

## 2015-10-11 MED ORDER — SUCRALFATE 1 GM/10ML PO SUSP: 1.0000 g | Freq: Three times a day (TID) | ORAL | Status: DC

## 2015-10-11 MED ORDER — GI COCKTAIL ~~LOC~~
30.0000 mL | Freq: Once | ORAL | Status: AC
  Administered 2015-10-11: 30 mL via ORAL
  Filled 2015-10-11: qty 30

## 2015-10-11 NOTE — ED Provider Notes (Signed)
CSN: [id number]     Arrival date & time 10/11/15  0350 History   First MD Initiated Contact with Patient 10/11/15 [phone]     Chief Complaint  Patient presents with  . Chest Pain     (Consider location/radiation/quality/duration/timing/severity/associated sxs/prior Treatment) Patient is a 60 y.o. male presenting with chest pain.  Chest Pain Pain location:  Substernal area, epigastric, R chest, L chest, R lateral chest and L lateral chest Pain quality: burning and sharp   Pain radiates to the back: no   Pain severity:  Mild Duration:  4 weeks Timing:  Constant Progression:  Waxing and waning Chronicity:  New Context: not breathing   Relieved by:  Antacids Exacerbated by: eating. Associated symptoms: abdominal pain, heartburn and nausea   Associated symptoms: no back pain, no cough, no fever, no headache, no numbness, no orthopnea, no palpitations, no shortness of breath and not vomiting     Past Medical History  Diagnosis Date  . Hypertension     benign  . Diabetes mellitus     type 2  . Arthritis   . Headache(784.0)   . Hyperlipidemia   . Constipation   . Dizziness    Past Surgical History  Procedure Laterality Date  . Colonoscopy    . Right femeroral bypass    . Right knee surgery    . Coronary angioplasty    . Hemorrhoid surgery     Family History  Problem Relation Age of Onset  . Anesthesia problems Neg Hx   . Hypotension Neg Hx   . Malignant hyperthermia Neg Hx   . Pseudochol deficiency Neg Hx   . Diabetes Other   . Hypertension Other    Social History  Substance Use Topics  . Smoking status: Former Research scientist (life sciences)  . Smokeless tobacco: None  . Alcohol Use: No    Review of Systems  Constitutional: Negative for fever, chills and activity change.  HENT: Negative for congestion and rhinorrhea.   Eyes: Negative for visual disturbance.  Respiratory: Negative for cough and shortness of breath.   Cardiovascular: Positive for chest pain. Negative for palpitations,  orthopnea and leg swelling.  Gastrointestinal: Positive for heartburn, nausea and abdominal pain. Negative for vomiting, diarrhea and constipation.  Endocrine: Negative for polydipsia and polyuria.  Genitourinary: Negative for dysuria and flank pain.  Musculoskeletal: Negative for back pain and neck pain.  Skin: Negative for wound.  Neurological: Negative for numbness and headaches.  All other systems reviewed and are negative.     Allergies  Review of patient's allergies indicates no known allergies.  Home Medications   Prior to Admission medications   Medication Sig Start Date End Date Taking? Authorizing Provider  acetaminophen (TYLENOL) 500 MG tablet Take 500 mg by mouth 3 (three) times daily as needed for mild pain.    Yes Historical Provider, MD  aspirin 81 MG tablet Take 81 mg by mouth every morning.    Yes Historical Provider, MD  atorvastatin (LIPITOR) 10 MG tablet TAKE ONE TABLET BY MOUTH ONCE DAILY 10/02/15  Yes [name], MD  Calcium Carbonate Antacid (TUMS ULTRA 1000 PO) Take 1 tablet by mouth 3 (three) times daily as needed (indigestion).   Yes Historical Provider, MD  carbamide peroxide (DEBROX) 6.5 % otic solution Place 5 drops into the right ear 2 (two) times daily as needed (ear wax).   Yes Historical Provider, MD  cetirizine (ZYRTEC) 10 MG tablet Take 10 mg by mouth daily as needed for allergies.  Yes Historical Provider, MD  Cyanocobalamin (VITAMIN B 12 PO) Take 1,000 mg by mouth daily.   Yes Historical Provider, MD  fluticasone (FLONASE) 50 MCG/ACT nasal spray Place 2 sprays into both nostrils daily. Patient taking differently: Place 2 sprays into both nostrils daily as needed for allergies.  10/17/14  Yes [name], MD  hydrochlorothiazide (HYDRODIURIL) 25 MG tablet Take 25 mg by mouth daily.   Yes Historical Provider, MD  lisinopril (PRINIVIL,ZESTRIL) 20 MG tablet Take 20 mg by mouth daily.   Yes Historical Provider, MD  metFORMIN (GLUCOPHAGE) 500 MG  tablet TAKE TWO TABLETS BY MOUTH IN THE MORNING WITH A MEAL AND ONE TABLET IN THE EVENING WITH A MEAL. 10/02/15  Yes [name], MD  pantoprazole (PROTONIX) 40 MG tablet Take 1 tablet (40 mg total) by mouth daily. 10/02/15  Yes [name], MD  triamcinolone cream (KENALOG) 0.1 % Apply 1 application topically 2 (two) times daily. 10/17/14  Yes [name], MD  lisinopril-hydrochlorothiazide (PRINZIDE,ZESTORETIC) 20-25 MG tablet Take 1 tablet by mouth daily. 10/02/15   [name], MD  sucralfate (CARAFATE) 1 GM/10ML suspension Take 10 mLs (1 g total) by mouth 4 (four) times daily -  with meals and at bedtime. 10/11/15   Corene Cornea Eleonora Peeler, MD   BP 138/93 mmHg  Pulse 63  Temp(Src) 98.2 F (36.8 C) (Oral)  Resp 21  SpO2 100% Physical Exam  Constitutional: He is oriented to person, place, and time. He appears well-developed and well-nourished.  HENT:  Head: Normocephalic and atraumatic.  Neck: Normal range of motion.  Cardiovascular: Normal rate and regular rhythm.   Pulmonary/Chest: Effort normal and breath sounds normal. No respiratory distress.  Abdominal: Soft. He exhibits no distension.  Musculoskeletal: Normal range of motion.  Neurological: He is alert and oriented to person, place, and time. No cranial nerve deficit.  Skin: Skin is warm and dry.  Nursing note and vitals reviewed.   ED Course  Procedures (including critical care time) Labs Review Labs Reviewed  BASIC METABOLIC PANEL - Abnormal; Notable for the following:    Chloride 99 (*)    Glucose, Bld 107 (*)    All other components within normal limits  CBC - Abnormal; Notable for the following:    Hemoglobin 12.0 (*)    HCT 35.1 (*)    All other components within normal limits  TROPONIN I  I-STAT TROPOININ, ED    Imaging Review Dg Chest 2 View  10/11/2015  CLINICAL DATA:  Upper chest pain radiating to both sides. Pain for several months. Shortness of breath today. EXAM: CHEST  2 VIEW COMPARISON:   03/17/2015 FINDINGS: Shallow inspiration. The heart size and mediastinal contours are within normal limits. Both lungs are clear. The visualized skeletal structures are unremarkable. IMPRESSION: No active cardiopulmonary disease. Electronically Signed   By: Lucienne Capers M.D.   On: 10/11/2015 04:17   I have personally reviewed and evaluated these images and lab results as part of my medical decision-making.   EKG Interpretation   Date/Time:  Wednesday October 11 2015 04:08:19 EDT Ventricular Rate:  67 PR Interval:  200 QRS Duration: 102 QT Interval:  350 QTC Calculation: 369 R Axis:   58 Text Interpretation:  Sinus rhythm Borderline T abnormalities, inferior  leads T-wave changes not seen previously Confirmed by Florina Ou  MD, Jenny Reichmann  [phone]) on 10/11/2015 4:21:07 AM Also confirmed by Florina Ou  MD, Jenny Reichmann  [phone]), editor [place]  CCT, [place] ([id number])  on 10/11/2015 7:52:44 AM  MDM   Final diagnoses:  Chest pain, unspecified chest pain type   Chest pain. Likely heartburn. Does have new T wave inversions only in III, will discuss with cardiology in reference to the significance of these, suspect they will not be a big deal.  symtoms improved with gi cocktail, will give carafate now. Also will repeat ecg and troponin.  Symptoms improved with carafate, repeat ECG and troponin unremarkable/unchanged, still suspect GERD.  2/2 DM, HTN, smoking history I discussed sole T wave inversion with cardiology and they feel like ACS is unlikely but will follow up as an outpatient, they will call you with an appointment.   New Prescriptions: New Prescriptions   No medications on file    I have personally and contemperaneously reviewed labs and imaging and used in my decision making as above.   A medical screening exam was performed and I feel the patient has had an appropriate workup for their chief complaint at this time and likelihood of emergent condition existing is low. Their vital signs are  stable. They have been counseled on decision, discharge, follow up and which symptoms necessitate immediate return to the emergency department.  They verbally stated understanding and agreement with plan and discharged in stable condition.      Merrily Pew, MD 10/11/15 1005

## 2015-10-11 NOTE — ED Notes (Signed)
Pt c/o chest pain, burning, abd pain and dry mouth; pt states that he saw his PCP and they referred him to a GI; GI stated that they couldn't see him until sometime in May; pt states that he cannot wait that long; pt states that the pain in his chest radiates from the middle to both sided and is described as burning; pt denies [facility]

## 2015-10-11 NOTE — ED Notes (Signed)
MD at bedside. 

## 2015-10-18 ENCOUNTER — Ambulatory Visit (INDEPENDENT_AMBULATORY_CARE_PROVIDER_SITE_OTHER): Payer: BLUE CROSS/BLUE SHIELD | Admitting: Physician Assistant

## 2015-10-18 VITALS — BP 122/72 | HR 83 | Temp 98.1°F | Resp 17 | Ht 73.5 in | Wt 206.0 lb

## 2015-10-18 DIAGNOSIS — H6121 Impacted cerumen, right ear: Secondary | ICD-10-CM | POA: Diagnosis not present

## 2015-10-18 NOTE — Progress Notes (Signed)
[facility] [address], [place] [address] [phone]  Date:  10/18/2015   Name:  Brendan Gonzales   DOB:  1955/07/25   MRN:  [id number]  PCP:  [name], MD    Chief Complaint: Ear Pain and Cerumen Impaction   History of Present Illness:  This is a 60 y.o. male with PMH T2DM, HTN who is presenting with ear wax problem. States went to [employer] [facility] for free hearing test 1.5 weeks ago. Hearing technician looked in his ears and told him he had too much wax to do the hearing test and told him to buy debrox and use at home. He used and states he was getting a lot of wax out. He went back to get the hearing test today and hearing technician told him the left ear was fine but there was still a lot of wax in the right ear and he likely needed to come in to get a lavage. He denies otalgia. He has noticed he has more trouble with hearing out of the right ear.  Review of Systems:  Review of Systems See HPI  Patient Active Problem List   Diagnosis Date Noted  . Eczema of hand 10/19/2014  . Type 2 diabetes mellitus ([place]) 06/13/2013  . HTN (hypertension) 06/13/2013  . Normal coronary arteries- 2008 06/13/2013  . Chest pain with moderate risk of acute coronary syndrome 06/12/2013  . Special screening for malignant neoplasms, colon 06/27/2011  . Arthritis of knee 06/14/2011    Prior to Admission medications   Medication Sig Start Date End Date Taking? Authorizing Provider  acetaminophen (TYLENOL) 500 MG tablet Take 500 mg by mouth 3 (three) times daily as needed for mild pain.    Yes Historical Provider, MD  aspirin 81 MG tablet Take 81 mg by mouth every morning.    Yes Historical Provider, MD  atorvastatin (LIPITOR) 10 MG tablet TAKE ONE TABLET BY MOUTH ONCE DAILY 10/02/15  Yes [name], MD  Calcium Carbonate Antacid (TUMS ULTRA 1000 PO) Take 1 tablet by mouth 3 (three) times daily as needed (indigestion).   Yes Historical Provider, MD  carbamide peroxide  (DEBROX) 6.5 % otic solution Place 5 drops into the right ear 2 (two) times daily as needed (ear wax).   Yes Historical Provider, MD  cetirizine (ZYRTEC) 10 MG tablet Take 10 mg by mouth daily as needed for allergies.    Yes Historical Provider, MD  Cyanocobalamin (VITAMIN B 12 PO) Take 1,000 mg by mouth daily.   Yes Historical Provider, MD  fluticasone (FLONASE) 50 MCG/ACT nasal spray Place 2 sprays into both nostrils daily. Patient taking differently: Place 2 sprays into both nostrils daily as needed for allergies.  10/17/14  Yes [name], MD  hydrochlorothiazide (HYDRODIURIL) 25 MG tablet Take 25 mg by mouth daily.   Yes Historical Provider, MD  lisinopril (PRINIVIL,ZESTRIL) 20 MG tablet Take 20 mg by mouth daily.   Yes Historical Provider, MD  lisinopril-hydrochlorothiazide (PRINZIDE,ZESTORETIC) 20-25 MG tablet Take 1 tablet by mouth daily. 10/02/15  Yes [name], MD  metFORMIN (GLUCOPHAGE) 500 MG tablet TAKE TWO TABLETS BY MOUTH IN THE MORNING WITH A MEAL AND ONE TABLET IN THE EVENING WITH A MEAL. 10/02/15  Yes [name], MD  pantoprazole (PROTONIX) 40 MG tablet Take 1 tablet (40 mg total) by mouth daily. 10/02/15  Yes [name], MD  sucralfate (CARAFATE) 1 GM/10ML suspension Take 10 mLs (1 g total) by mouth  4 (four) times daily -  with meals and at bedtime. 10/11/15  Yes Merrily Pew, MD  triamcinolone cream (KENALOG) 0.1 % Apply 1 application topically 2 (two) times daily. 10/17/14  Yes [name], MD    No Known Allergies  Past Surgical History  Procedure Laterality Date  . Colonoscopy    . Right femeroral bypass    . Right knee surgery    . Coronary angioplasty    . Hemorrhoid surgery      Social History  Substance Use Topics  . Smoking status: Former Research scientist (life sciences)  . Smokeless tobacco: None  . Alcohol Use: No    Family History  Problem Relation Age of Onset  . Anesthesia problems Neg Hx   . Hypotension Neg Hx   . Malignant hyperthermia Neg Hx   .  Pseudochol deficiency Neg Hx   . Diabetes Other   . Hypertension Other     Medication list has been reviewed and updated.  Physical Examination:  Physical Exam  Constitutional: He is oriented to person, place, and time. He appears well-developed and well-nourished. No distress.  HENT:  Head: Normocephalic and atraumatic.  Right Ear: Hearing, external ear and ear canal normal.  Left Ear: Hearing, tympanic membrane, external ear and ear canal normal.  Nose: Nose normal.  Right TM obstructed by cerumen. Right canal and TM clear after ear lavage.  Eyes: Conjunctivae and lids are normal. Right eye exhibits no discharge. Left eye exhibits no discharge. No scleral icterus.  Pulmonary/Chest: Effort normal. No respiratory distress.  Musculoskeletal: Normal range of motion.  Neurological: He is alert and oriented to person, place, and time.  Skin: Skin is warm, dry and intact. No lesion and no rash noted.  Psychiatric: He has a normal mood and affect. His speech is normal and behavior is normal. Thought content normal.   BP 122/72 mmHg  Pulse 83  Temp(Src) 98.1 F (36.7 C) (Oral)  Resp 17  Ht 6' 1.5" (1.867 m)  Wt 206 lb (93.441 kg)  BMI 26.81 kg/m2  SpO2 98%  Assessment and Plan:  1. Cerumen impaction, right Cerumen removed with lavage. Return as needed.   Benjaman Pott Drenda Freeze, MHS [facility]  10/18/2015

## 2015-10-18 NOTE — Patient Instructions (Addendum)
Return as needed.    IF you received an x-ray today, you will receive an invoice from [facility]. Please contact [facility] at [phone] with questions or concerns regarding your invoice.   IF you received labwork today, you will receive an invoice from [employer]. Please contact [facility] at [phone] with questions or concerns regarding your invoice.   Our billing staff will not be able to assist you with questions regarding bills from these companies.  You will be contacted with the lab results as soon as they are available. The fastest way to get your results is to activate your My Chart account. Instructions are located on the last page of this paperwork. If you have not heard from [place] regarding the results in 2 weeks, please contact this office.

## 2015-10-27 ENCOUNTER — Ambulatory Visit (INDEPENDENT_AMBULATORY_CARE_PROVIDER_SITE_OTHER): Payer: BLUE CROSS/BLUE SHIELD | Admitting: Cardiovascular Disease

## 2015-10-27 ENCOUNTER — Encounter: Payer: Self-pay | Admitting: Cardiovascular Disease

## 2015-10-27 VITALS — BP 114/76 | HR 84 | Ht 73.5 in | Wt 189.0 lb

## 2015-10-27 DIAGNOSIS — R42 Dizziness and giddiness: Secondary | ICD-10-CM

## 2015-10-27 DIAGNOSIS — E785 Hyperlipidemia, unspecified: Secondary | ICD-10-CM | POA: Diagnosis not present

## 2015-10-27 DIAGNOSIS — I10 Essential (primary) hypertension: Secondary | ICD-10-CM | POA: Diagnosis not present

## 2015-10-27 DIAGNOSIS — R079 Chest pain, unspecified: Secondary | ICD-10-CM | POA: Diagnosis not present

## 2015-10-27 DIAGNOSIS — R011 Cardiac murmur, unspecified: Secondary | ICD-10-CM | POA: Diagnosis not present

## 2015-10-27 NOTE — Progress Notes (Signed)
Cardiology Office Note   Date:  10/27/2015   ID:  Brendan Gonzales, DOB March 17, 1956, MRN [id number]  PCP:  [name], MD  Cardiologist:   [name], MD   Chief Complaint  Patient presents with  . New Patient (Initial Visit)    Heaviness in chest, some swelling, dizziness, tachycardia,      History of Present Illness: Brendan Gonzales is a 60 y.o. male with hypertension and diabetes mellitus type 2 who presents for an evaluation of chest pain.  Mr.  Brendan Gonzales  was seen in the emergency department on 10/11/15 with a complaint of chest pain.  He reported severe, burning, substernal chest pain after leaving work.  He also noted a sour taste in his mouth.  It felt like fire was inside his stomach.He reports that the symptoms are worse both with exertion and when laying down.  In the ED he was noted to have T-wave inversions in leads 3 and cardiology was consulted. The symptoms improved with a GI cocktail. Cardiac enzymes remained unremarkable. He was instructed to follow-up with cardiology as an outpatient.  Since leaving the ED he notes that carafate is not helping.  He continues to have episodes of chest pain that last approximately 15 minutes.  There is no associated shortness of breath, nausea or vomiting.  He does endorse diaphoresis.  He has not noted any lower extremity edema, orhtopnea or PND.  Brendan Gonzales is scheduled to see Dr. Bosie Clos (GI) on 5/19.  Brendan Gonzales reports dizziness with positional changes.  This happens when bending over and standing up or when he walks up steps.  He denies syncope.  He has not been exercising due to knee pain.   Past Medical History  Diagnosis Date  . Hypertension     benign  . Diabetes mellitus     type 2  . Arthritis   . Headache(784.0)   . Hyperlipidemia   . Constipation   . Dizziness     Past Surgical History  Procedure Laterality Date  . Colonoscopy    . Right femeroral bypass    . Right knee surgery    . Coronary angioplasty    .  Hemorrhoid surgery       Current Outpatient Prescriptions  Medication Sig Dispense Refill  . acetaminophen (TYLENOL) 500 MG tablet Take 500 mg by mouth 3 (three) times daily as needed for mild pain.     Marland Kitchen aspirin 81 MG tablet Take 81 mg by mouth every morning.     Marland Kitchen atorvastatin (LIPITOR) 10 MG tablet TAKE ONE TABLET BY MOUTH ONCE DAILY 90 tablet 1  . Calcium Carbonate Antacid (TUMS ULTRA 1000 PO) Take 1 tablet by mouth 3 (three) times daily as needed (indigestion).    . carbamide peroxide (DEBROX) 6.5 % otic solution Place 5 drops into the right ear 2 (two) times daily as needed (ear wax).    . cetirizine (ZYRTEC) 10 MG tablet Take 10 mg by mouth daily as needed for allergies.     . Cyanocobalamin (VITAMIN B 12 PO) Take 1,000 mg by mouth daily.    . fluticasone (FLONASE) 50 MCG/ACT nasal spray Place 2 sprays into both nostrils daily. (Patient taking differently: Place 2 sprays into both nostrils daily as needed for allergies. ) 16 g 6  . lisinopril-hydrochlorothiazide (PRINZIDE,ZESTORETIC) 20-25 MG tablet Take 1 tablet by mouth daily. 90 tablet 1  . metFORMIN (GLUCOPHAGE) 500 MG tablet TAKE TWO TABLETS BY MOUTH IN THE MORNING WITH A MEAL  AND ONE TABLET IN THE EVENING WITH A MEAL. 135 tablet 1  . pantoprazole (PROTONIX) 40 MG tablet Take 1 tablet (40 mg total) by mouth daily. 90 tablet 1  . sucralfate (CARAFATE) 1 GM/10ML suspension Take 10 mLs (1 g total) by mouth 4 (four) times daily -  with meals and at bedtime. 420 mL 0  . triamcinolone cream (KENALOG) 0.1 % Apply 1 application topically 2 (two) times daily. 30 g 0   No current facility-administered medications for this visit.    Allergies:   Review of patient's allergies indicates no known allergies.    Social History:  The patient  reports that he has quit smoking. He does not have any smokeless tobacco history on file. He reports that he does not drink alcohol or use illicit drugs.   Family History:  The patient's family history  includes Diabetes in his other; Hypertension in his other. There is no history of Anesthesia problems, Hypotension, Malignant hyperthermia, or Pseudochol deficiency.    ROS:  Please see the history of present illness.   Otherwise, review of systems are positive for none.   All other systems are reviewed and negative.    PHYSICAL EXAM: VS:  BP 114/76 mmHg  Pulse 84  Ht 6' 1.5" (1.867 m)  Wt 85.73 kg (189 lb)  BMI 24.59 kg/m2 , BMI Body mass index is 24.59 kg/(m^2). GENERAL:  Well appearing HEENT:  Pupils equal round and reactive, fundi not visualized, oral mucosa unremarkable NECK:  No jugular venous distention, waveform within normal limits, carotid upstroke brisk and symmetric, no bruits, no thyromegaly LYMPHATICS:  No cervical adenopathy LUNGS:  Clear to auscultation bilaterally HEART:  RRR.  PMI not displaced or sustained,S1 and S2 within normal limits, no S3, no S4, no clicks, no rubs, II/VI systolic murmur at the RUSB ABD:  Flat, positive bowel sounds normal in frequency in pitch, no bruits, no rebound, no guarding, no midline pulsatile mass, no hepatomegaly, no splenomegaly EXT:  2 plus pulses throughout, no edema, no cyanosis no clubbing SKIN:  No rashes no nodules NEURO:  Cranial nerves II through XII grossly intact, motor grossly intact throughout PSYCH:  Cognitively intact, oriented to person place and time    EKG:  EKG is not ordered today. The ekg ordered 10/11/15 demonstrates sinus rhythm rate 64 BPM.  First degree heart block.    Recent Labs: 05/01/2015: TSH 0.822 10/02/2015: ALT 14 10/11/2015: BUN 11; Creatinine, Ser 0.64; Hemoglobin 12.0*; Platelets 311; Potassium 3.6; Sodium 136    Lipid Panel    Component Value Date/Time   CHOL 144 10/02/2015 1157   TRIG 109 10/02/2015 1157   HDL 41 10/02/2015 1157   CHOLHDL 3.5 10/02/2015 1157   VLDL 22 10/02/2015 1157   LDLCALC 81 10/02/2015 1157      Wt Readings from Last 3 Encounters:  10/27/15 85.73 kg (189 lb)    10/18/15 93.441 kg (206 lb)  10/02/15 91.899 kg (202 lb 9.6 oz)      ASSESSMENT AND PLAN:  # Chest pain: Symptoms seem most consistent with GERD.  Patient was encouraged to continue protonix and carafate.  We will also check a Lexiscan Cardiolite to evaluate for ischemia.   # Dizziness:  #  Murmur:  Mr. Ravenscraft was not orthostatic today in clinic today.  He has no carotid bruits.  He does, however have a systolic murmur at the RUSB.  We will obtain an echo to evaluate for structural heart diseas.   # Hypertension: BP  is well-controlled.  Continue lisinoopril/HCTZ.  # Hyperlipidemia: Continue atorvastatin.  LDL is 87.   Current medicines are reviewed at length with the patient today.  The patient does not have concerns regarding medicines.  The following changes have been made:  no change  Labs/ tests ordered today include:  No orders of the defined types were placed in this encounter.     Disposition:   FU with Maureen Delatte C. Duke Salvia, MD, [facility] in 1 month   This note was written with the assistance of speech recognition software.  Please excuse any transcriptional errors.  Signed, Valisha Heslin C. Duke Salvia, MD, [facility]  10/27/2015 2:04 PM    [facility]

## 2015-10-27 NOTE — Patient Instructions (Signed)
Medication Instructions:  Your physician recommends that you continue on your current medications as directed. Please refer to the Current Medication list given to you today.  Labwork: NONE  Testing/Procedures: Your physician has requested that you have an echocardiogram. Echocardiography is a painless test that uses sound waves to create images of your heart. It provides your doctor with information about the size and shape of your heart and how well your heart's chambers and valves are working. This procedure takes approximately one hour. There are no restrictions for this procedure.  Your physician has requested that you have a lexiscan myoview. For further information please visit [email or web address]. Please follow instruction sheet, as given.  Follow-Up: Your physician recommends that you schedule a follow-up appointment in: 1 MONTH OV  Any Other Special Instructions Will Be Listed Below (If Applicable).     If you need a refill on your cardiac medications before your next appointment, please call your pharmacy.

## 2015-10-29 ENCOUNTER — Encounter: Payer: Self-pay | Admitting: Cardiovascular Disease

## 2015-11-10 ENCOUNTER — Telehealth (HOSPITAL_COMMUNITY): Payer: Self-pay

## 2015-11-10 NOTE — Telephone Encounter (Signed)
Encounter complete. 

## 2015-11-15 ENCOUNTER — Ambulatory Visit (HOSPITAL_COMMUNITY)
Admission: RE | Admit: 2015-11-15 | Discharge: 2015-11-15 | Disposition: A | Discharge status: Home / Self Care | Payer: BLUE CROSS/BLUE SHIELD | Source: Ambulatory Visit | Attending: Cardiovascular Disease | Admitting: Cardiovascular Disease

## 2015-11-15 DIAGNOSIS — R002 Palpitations: Secondary | ICD-10-CM | POA: Diagnosis not present

## 2015-11-15 DIAGNOSIS — R079 Chest pain, unspecified: Secondary | ICD-10-CM | POA: Diagnosis not present

## 2015-11-15 DIAGNOSIS — R42 Dizziness and giddiness: Secondary | ICD-10-CM | POA: Insufficient documentation

## 2015-11-15 DIAGNOSIS — I10 Essential (primary) hypertension: Secondary | ICD-10-CM | POA: Insufficient documentation

## 2015-11-15 DIAGNOSIS — R0609 Other forms of dyspnea: Secondary | ICD-10-CM | POA: Insufficient documentation

## 2015-11-15 DIAGNOSIS — E119 Type 2 diabetes mellitus without complications: Secondary | ICD-10-CM | POA: Insufficient documentation

## 2015-11-15 DIAGNOSIS — R5383 Other fatigue: Secondary | ICD-10-CM | POA: Diagnosis not present

## 2015-11-15 DIAGNOSIS — R011 Cardiac murmur, unspecified: Secondary | ICD-10-CM

## 2015-11-15 LAB — MYOCARDIAL PERFUSION IMAGING
CHL CUP NUCLEAR SDS: 2
CHL CUP RESTING HR STRESS: 60 {beats}/min
LV dias vol: 119 mL (ref 62–150)
LV sys vol: 52 mL
Peak HR: 99 {beats}/min
SRS: 2
SSS: 4
TID: 1.07

## 2015-11-15 MED ORDER — TECHNETIUM TC 99M TETROFOSMIN IV KIT
10.4000 | PACK | Freq: Once | INTRAVENOUS | Status: AC | PRN
  Administered 2015-11-15: 10 via INTRAVENOUS
  Filled 2015-11-15: qty 10

## 2015-11-15 MED ORDER — AMINOPHYLLINE 25 MG/ML IV SOLN
75.0000 mg | Freq: Once | INTRAVENOUS | Status: AC
Start: 2015-11-15 — End: 2015-11-15
  Administered 2015-11-15: 75 mg via INTRAVENOUS

## 2015-11-15 MED ORDER — TECHNETIUM TC 99M TETROFOSMIN IV KIT
28.2000 | PACK | Freq: Once | INTRAVENOUS | Status: AC | PRN
  Administered 2015-11-15: 28.2 via INTRAVENOUS
  Filled 2015-11-15: qty 28

## 2015-11-15 MED ORDER — REGADENOSON 0.4 MG/5ML IV SOLN
0.4000 mg | Freq: Once | INTRAVENOUS | Status: AC
Start: 2015-11-15 — End: 2015-11-15
  Administered 2015-11-15: 0.4 mg via INTRAVENOUS

## 2015-11-16 ENCOUNTER — Telehealth: Payer: Self-pay | Admitting: Cardiovascular Disease

## 2015-11-16 NOTE — Telephone Encounter (Signed)
Mr.Inabinet is calling to get the results of his stress and echo test . Please call   Thanks

## 2015-11-16 NOTE — Telephone Encounter (Signed)
Pending Dr. Blenda Mounts result notes.

## 2015-11-17 NOTE — Telephone Encounter (Signed)
Pt calling back regarding Echo results-appt shows no show but pt thinks he had it done-I see order but no final result-pls call pt [phone]

## 2015-11-22 NOTE — Telephone Encounter (Signed)
Spoke to patient on 5/26 and apologized he did not get Echo day he was at office for [facility]. Did reschedule Echo

## 2015-12-06 ENCOUNTER — Ambulatory Visit (HOSPITAL_COMMUNITY): Payer: BLUE CROSS/BLUE SHIELD | Attending: Cardiovascular Disease

## 2015-12-06 DIAGNOSIS — I071 Rheumatic tricuspid insufficiency: Secondary | ICD-10-CM | POA: Insufficient documentation

## 2015-12-06 DIAGNOSIS — R011 Cardiac murmur, unspecified: Secondary | ICD-10-CM | POA: Diagnosis not present

## 2015-12-06 DIAGNOSIS — I517 Cardiomegaly: Secondary | ICD-10-CM | POA: Insufficient documentation

## 2015-12-06 DIAGNOSIS — I351 Nonrheumatic aortic (valve) insufficiency: Secondary | ICD-10-CM | POA: Diagnosis not present

## 2015-12-06 LAB — ECHOCARDIOGRAM COMPLETE
AVPHT: 331 ms
CHL CUP DOP CALC LVOT VTI: 21.6 cm
CHL CUP MV DEC (S): 141
CHL CUP TV REG PEAK VELOCITY: 174 cm/s
E/e' ratio: 4.9
EWDT: 141 ms
FS: 33 % (ref 28–44)
IVS/LV PW RATIO, ED: 0.82
LA ID, A-P, ES: 37 mm
LADIAMINDEX: 1.75 cm/m2
LAVOL: 55 mL
LAVOLA4C: 51 mL
LAVOLIN: 25.9 mL/m2
LEFT ATRIUM END SYS DIAM: 37 mm
LV E/e' medial: 4.9
LV PW d: 14.2 mm — AB (ref 0.6–1.1)
LVEEAVG: 4.9
LVELAT: 13 cm/s
LVOT area: 3.46 cm2
LVOT peak vel: 111 cm/s
LVOTD: 21 mm
LVOTSV: 75 mL
MVPKAVEL: 74.5 m/s
MVPKEVEL: 63.7 m/s
TDI e' lateral: 13
TDI e' medial: 9.43
TRMAXVEL: 174 cm/s

## 2015-12-07 ENCOUNTER — Ambulatory Visit (INDEPENDENT_AMBULATORY_CARE_PROVIDER_SITE_OTHER): Payer: BLUE CROSS/BLUE SHIELD | Admitting: Cardiovascular Disease

## 2015-12-07 ENCOUNTER — Encounter: Payer: Self-pay | Admitting: Cardiovascular Disease

## 2015-12-07 VITALS — BP 126/88 | HR 70 | Ht 73.0 in | Wt 198.4 lb

## 2015-12-07 DIAGNOSIS — E785 Hyperlipidemia, unspecified: Secondary | ICD-10-CM

## 2015-12-07 DIAGNOSIS — R0789 Other chest pain: Secondary | ICD-10-CM

## 2015-12-07 DIAGNOSIS — I10 Essential (primary) hypertension: Secondary | ICD-10-CM

## 2015-12-07 NOTE — Patient Instructions (Signed)
Your physician recommends that you schedule a follow-up appointment in: AS NEEDED  

## 2015-12-07 NOTE — Progress Notes (Signed)
Cardiology Office Note   Date:  12/07/2015   ID:  Brendan Gonzales, DOB [birth year], MRN [id number]  PCP:  [name], MD  Cardiologist:   [name], MD  GI: Dr. Bosie Clos  Chief Complaint  Patient presents with  . Follow-up    1 month post ECHO and lexiscan myoview  pt states no new Sx.--states he is feeling a little better     History of Present Illness: Brendan Gonzales is a 60 y.o. male with hypertension and diabetes mellitus type 2 who presents for follow up on chest pain.  Mr.  Gonzales  was seen in the emergency department on 10/11/15 with a complaint of chest pain.  In the ED he was noted to have T-wave inversions in lead 3 and cardiology was consulted. The symptoms improved with a GI cocktail. Cardiac enzymes remained unremarkable. He was instructed to follow-up with cardiology as an outpatient.  He was referred for [facility] on 10/2015 that was negative for ischemia.  He also had an echo on 12/06/15 that was unremarkable.  Brendan Gonzales continues to have chest pain several times every day.  It is burning, substernal chest discomfort that is worse after eating.  Episodes last approximately 10 minutes at a time.  He saw Dr. Bosie Clos of GI and is scheduled for EGD and colonoscopy next week.  He has not been taking the protonix and carafate regularly because he is currently fasting for religious reasons.  He notes lower extremity edema that improves with elevation.  He denies orthopnea or PND. He has not been exercising due to knee pain.  Past Medical History  Diagnosis Date  . Hypertension     benign  . Diabetes mellitus     type 2  . Arthritis   . Headache(784.0)   . Hyperlipidemia   . Constipation   . Dizziness     Past Surgical History  Procedure Laterality Date  . Colonoscopy    . Right femeroral bypass    . Right knee surgery    . Coronary angioplasty    . Hemorrhoid surgery       Current Outpatient Prescriptions  Medication Sig Dispense Refill  .  acetaminophen (TYLENOL) 500 MG tablet Take 500 mg by mouth 3 (three) times daily as needed for mild pain.     Marland Kitchen aspirin 81 MG tablet Take 81 mg by mouth every morning.     Marland Kitchen atorvastatin (LIPITOR) 10 MG tablet TAKE ONE TABLET BY MOUTH ONCE DAILY 90 tablet 1  . Calcium Carbonate Antacid (TUMS ULTRA 1000 PO) Take 1 tablet by mouth 3 (three) times daily as needed (indigestion).    . carbamide peroxide (DEBROX) 6.5 % otic solution Place 5 drops into the right ear 2 (two) times daily as needed (ear wax).    . cetirizine (ZYRTEC) 10 MG tablet Take 10 mg by mouth daily as needed for allergies.     . Cyanocobalamin (VITAMIN B 12 PO) Take 1,000 mg by mouth daily.    . fluticasone (FLONASE) 50 MCG/ACT nasal spray Place 2 sprays into both nostrils daily. (Patient taking differently: Place 2 sprays into both nostrils daily as needed for allergies. ) 16 g 6  . lisinopril-hydrochlorothiazide (PRINZIDE,ZESTORETIC) 20-25 MG tablet Take 1 tablet by mouth daily. 90 tablet 1  . metFORMIN (GLUCOPHAGE) 500 MG tablet TAKE TWO TABLETS BY MOUTH IN THE MORNING WITH A MEAL AND ONE TABLET IN THE EVENING WITH A MEAL. 135 tablet 1  . pantoprazole (PROTONIX) 40 MG  tablet Take 1 tablet (40 mg total) by mouth daily. 90 tablet 1  . sucralfate (CARAFATE) 1 GM/10ML suspension Take 10 mLs (1 g total) by mouth 4 (four) times daily -  with meals and at bedtime. 420 mL 0  . triamcinolone cream (KENALOG) 0.1 % Apply 1 application topically 2 (two) times daily. 30 g 0   No current facility-administered medications for this visit.    Allergies:   Review of patient's allergies indicates no known allergies.    Social History:  The patient  reports that he has quit smoking. He does not have any smokeless tobacco history on file. He reports that he does not drink alcohol or use illicit drugs.   Family History:  The patient's family history includes Diabetes in his other; Hypertension in his other. There is no history of Anesthesia  problems, Hypotension, Malignant hyperthermia, or Pseudochol deficiency.    ROS:  Please see the history of present illness.   Otherwise, review of systems are positive for none.   All other systems are reviewed and negative.    PHYSICAL EXAM: VS:  BP 126/88 mmHg  Pulse 70  Ht 6\' 1"  (1.854 m)  Wt 198 lb 6.4 oz (89.994 kg)  BMI 26.18 kg/m2 , BMI Body mass index is 26.18 kg/(m^2). GENERAL:  Well appearing HEENT:  Pupils equal round and reactive, fundi not visualized, oral mucosa unremarkable NECK:  No jugular venous distention, waveform within normal limits, carotid upstroke brisk and symmetric, no bruits,  LYMPHATICS:  No cervical adenopathy LUNGS:  Clear to auscultation bilaterally HEART:  RRR.  PMI not displaced or sustained,S1 and S2 within normal limits, no S3, no S4, no clicks, no rubs, II/VI systolic murmur at the RUSB ABD:  Flat, positive bowel sounds normal in frequency in pitch, no bruits, no rebound, no guarding, no midline pulsatile mass, no hepatomegaly, no splenomegaly EXT:  2 plus pulses throughout, no edema, no cyanosis no clubbing SKIN:  No rashes no nodules NEURO:  Cranial nerves II through XII grossly intact, motor grossly intact throughout PSYCH:  Cognitively intact, oriented to person place and time    EKG:  EKG is not ordered today. The ekg ordered 10/11/15 demonstrates sinus rhythm rate 64 BPM.  First degree heart block.   Lexiscan Myoview 11/15/15: Study Highlights     Nuclear stress EF: 57%. No wall motion abnormality  The left ventricular ejection fraction is normal (55-65%).  There was no ST segment deviation noted during stress.  This is a low risk study. No ischemia identified. Normal stress perfusion.   Echo 12/06/15: Study Conclusions  - Left ventricle: The cavity size was normal. There was moderate  hyperterophy of the posterior wall and mild hypertrophy of the  septal wall. Systolic function was normal. The estimated ejection  fraction  was in the range of 60% to 65%. Wall motion was normal;  there were no regional wall motion abnormalities. Left  ventricular diastolic function parameters were normal. - Aortic valve: There was mild regurgitation. - Mitral valve: Transvalvular velocity was within the normal range.  There was no evidence for stenosis. There was no regurgitation. - Right ventricle: The cavity size was normal. Wall thickness was  normal. Systolic function was normal. - Atrial septum: No defect or patent foramen ovale was identified  by color flow Doppler. - Tricuspid valve: There was trivial regurgitation. - Pulmonary arteries: Systolic pressure was within the normal  range. PA peak pressure: 15 mm Hg (S).  Recent Labs: 05/01/2015: TSH 0.822  10/02/2015: ALT 14 10/11/2015: BUN 11; Creatinine, Ser 0.64; Hemoglobin 12.0*; Platelets 311; Potassium 3.6; Sodium 136    Lipid Panel    Component Value Date/Time   CHOL 144 10/02/2015 1157   TRIG 109 10/02/2015 1157   HDL 41 10/02/2015 1157   CHOLHDL 3.5 10/02/2015 1157   VLDL 22 10/02/2015 1157   LDLCALC 81 10/02/2015 1157      Wt Readings from Last 3 Encounters:  12/07/15 198 lb 6.4 oz (89.994 kg)  11/15/15 189 lb (85.73 kg)  10/27/15 189 lb (85.73 kg)      ASSESSMENT AND PLAN:  # Chest pain: Echo and nuclear stress test were unremarkable. Symptoms seem most consistent with GERD.  Patient was encouraged to continue protonix and carafate.  He was encouraged to consider a medical exemption from his fasting so that he can take his medications as prescribed. He will follow-up with Dr. Bosie Clos for EGD and colonoscopy next week.  # Hypertension: BP is well-controlled.  Continue lisinopril/HCTZ.  # Hyperlipidemia: Continue atorvastatin.  LDL is 87.  # CV Disease Prevention: ASCVD 10 year risk is 13%.  Continue aspirin and statin.   Current medicines are reviewed at length with the patient today.  The patient does not have concerns regarding  medicines.  The following changes have been made:  no change  Labs/ tests ordered today include:  No orders of the defined types were placed in this encounter.    Time spent: 15 minutes-Greater than 50% of this time was spent in counseling, explanation of diagnosis, planning of further management, and coordination of care.  Disposition:   FU with Agness Sibrian C. Duke Salvia, MD, [facility] as needed.   This note was written with the assistance of speech recognition software.  Please excuse any transcriptional errors.  Signed, Priscille Shadduck C. Duke Salvia, MD, [facility]  12/07/2015 9:02 AM    [facility]

## 2016-01-03 ENCOUNTER — Ambulatory Visit: Payer: BLUE CROSS/BLUE SHIELD | Admitting: Family Medicine

## 2016-01-04 ENCOUNTER — Ambulatory Visit: Payer: BLUE CROSS/BLUE SHIELD | Admitting: Family Medicine

## 2016-02-05 ENCOUNTER — Other Ambulatory Visit: Payer: Self-pay | Admitting: Family Medicine

## 2016-02-05 DIAGNOSIS — IMO0001 Reserved for inherently not codable concepts without codable children: Secondary | ICD-10-CM

## 2016-02-05 DIAGNOSIS — E1165 Type 2 diabetes mellitus with hyperglycemia: Principal | ICD-10-CM

## 2016-02-08 ENCOUNTER — Ambulatory Visit (INDEPENDENT_AMBULATORY_CARE_PROVIDER_SITE_OTHER): Payer: BLUE CROSS/BLUE SHIELD | Admitting: Family Medicine

## 2016-02-08 ENCOUNTER — Telehealth: Payer: Self-pay

## 2016-02-08 ENCOUNTER — Encounter: Payer: Self-pay | Admitting: Family Medicine

## 2016-02-08 VITALS — BP 124/80 | HR 70 | Temp 98.9°F | Resp 16 | Ht 73.0 in | Wt 202.0 lb

## 2016-02-08 DIAGNOSIS — M25561 Pain in right knee: Secondary | ICD-10-CM

## 2016-02-08 DIAGNOSIS — M1711 Unilateral primary osteoarthritis, right knee: Secondary | ICD-10-CM | POA: Diagnosis not present

## 2016-02-08 DIAGNOSIS — E1165 Type 2 diabetes mellitus with hyperglycemia: Secondary | ICD-10-CM | POA: Diagnosis not present

## 2016-02-08 DIAGNOSIS — IMO0001 Reserved for inherently not codable concepts without codable children: Secondary | ICD-10-CM

## 2016-02-08 DIAGNOSIS — E785 Hyperlipidemia, unspecified: Secondary | ICD-10-CM

## 2016-02-08 DIAGNOSIS — E119 Type 2 diabetes mellitus without complications: Secondary | ICD-10-CM | POA: Diagnosis not present

## 2016-02-08 DIAGNOSIS — I10 Essential (primary) hypertension: Secondary | ICD-10-CM

## 2016-02-08 LAB — COMPLETE METABOLIC PANEL WITH GFR
ALBUMIN: 4.3 g/dL (ref 3.6–5.1)
ALK PHOS: 66 U/L (ref 40–115)
ALT: 14 U/L (ref 9–46)
AST: 17 U/L (ref 10–35)
BILIRUBIN TOTAL: 0.4 mg/dL (ref 0.2–1.2)
BUN: 8 mg/dL (ref 7–25)
CALCIUM: 9.4 mg/dL (ref 8.6–10.3)
CO2: 27 mmol/L (ref 20–31)
Chloride: 103 mmol/L (ref 98–110)
Creat: 0.69 mg/dL — ABNORMAL LOW (ref 0.70–1.33)
GLUCOSE: 115 mg/dL — AB (ref 65–99)
POTASSIUM: 3.9 mmol/L (ref 3.5–5.3)
SODIUM: 140 mmol/L (ref 135–146)
Total Protein: 7 g/dL (ref 6.1–8.1)

## 2016-02-08 LAB — LIPID PANEL
CHOL/HDL RATIO: 3 ratio (ref <=5.0)
CHOLESTEROL: 151 mg/dL (ref 125–200)
HDL: 51 mg/dL (ref >=40)
LDL Cholesterol: 77 mg/dL (ref <130)
TRIGLYCERIDES: 115 mg/dL (ref <150)
VLDL: 23 mg/dL (ref <30)

## 2016-02-08 LAB — HEMOGLOBIN A1C
Hgb A1c MFr Bld: 7.4 % — ABNORMAL HIGH (ref <5.7)
Mean Plasma Glucose: 166 mg/dL

## 2016-02-08 MED ORDER — DICLOFENAC SODIUM 1 % TD GEL
4.0000 g | Freq: Four times a day (QID) | TRANSDERMAL | 1 refills | Status: DC
Start: 2016-02-08 — End: 2018-07-22

## 2016-02-08 MED ORDER — ATORVASTATIN CALCIUM 10 MG PO TABS
ORAL_TABLET | ORAL | 1 refills | Status: DC
Start: 2016-02-08 — End: 2016-07-29

## 2016-02-08 MED ORDER — LISINOPRIL-HYDROCHLOROTHIAZIDE 20-25 MG PO TABS: 1.0000 | ORAL_TABLET | Freq: Every day | ORAL | 1 refills | Status: DC

## 2016-02-08 MED ORDER — METFORMIN HCL 500 MG PO TABS: ORAL_TABLET | ORAL | 0 refills | Status: DC

## 2016-02-08 NOTE — Telephone Encounter (Signed)
Cream is NOT COVERED by insurance,  Patient requesting something else, covered by insurance.  [phone] Jerilynn Mages

## 2016-02-08 NOTE — Addendum Note (Signed)
Addended by: [name] R on: 02/08/2016 01:54 PM   Modules accepted: Orders

## 2016-02-08 NOTE — Patient Instructions (Addendum)
  No change in medications for now. I will let you know once the lab results return if medication changes are needed. Make sure you're watching your diet choices as this can also affect your diabetes control.  Try the new gel for your right knee pain, and follow-up with orthopedics to discuss other options. Tylenol is okay to take if needed.  Return to the clinic or go to the nearest emergency room if any of your symptoms worsen or new symptoms occur.    IF you received an x-ray today, you will receive an invoice from [facility]. Please contact [facility] at [phone] with questions or concerns regarding your invoice.   IF you received labwork today, you will receive an invoice from [employer]. Please contact [facility] at [phone] with questions or concerns regarding your invoice.   Our billing staff will not be able to assist you with questions regarding bills from these companies.  You will be contacted with the lab results as soon as they are available. The fastest way to get your results is to activate your My Chart account. Instructions are located on the last page of this paperwork. If you have not heard from [place] regarding the results in 2 weeks, please contact this office.

## 2016-02-08 NOTE — Progress Notes (Signed)
By signing my name below, I, [name], attest that this documentation has been prepared under the direction and in the presence of [name], MD.  Electronically Signed: [name], Medical Scribe. 02/08/16. 1:33 PM.  Subjective:    Patient ID: Brendan Gonzales, male    DOB: 12/21/1955, 60 y.o.   MRN: [id number]  HPI Chief Complaint  Patient presents with  . Diabetes    patient is fasting today    HPI Comments: Brendan Gonzales is a 60 y.o. male who presents to the [facility] complaining of DM. We maintained the same medications. He is on a statin and was referred to cardiology for chest symptoms last visit. He was seen by cardiology and had a evaluation with Dr. Oval Linsey in May. Nl echo in June, negative stress test in May. Pt went to [place] last week to see his sick mother. Pt mentions he had a rash between his groin last week and was treated in [place]. His symptoms have improved since using Vocozole cream. Pt's chest pain has improved since his last cardiologist visit.  Right knee pain on and off for a while. Pt had an x-ray tricompartment ostearthritis worse in the medial and patellofemoral areas. Pt's knees are hurting him and he takes 4-5 Tylenol a day for relief to his symptoms. Pt mentions he's moving to on a lower floor to help his knees. Pt was seen in [place] [facility] and was recommended injections, but deferred due to financial concerns.  DM: Pt's blood sugar is running between 149-189 with his lowest reading is 125. Lab Results  Component Value Date   MICROALBUR 3.9 10/02/2015   Lab Results  Component Value Date   HGBA1C 7.1 10/02/2015   HLD: Lab Results  Component Value Date   ALT 14 10/02/2015   AST 14 10/02/2015   ALKPHOS 86 10/02/2015   BILITOT 0.4 10/02/2015   Lab Results  Component Value Date   CHOL 144 10/02/2015   HDL 41 10/02/2015   LDLCALC 81 10/02/2015   TRIG 109 10/02/2015   CHOLHDL 3.5 10/02/2015    Patient Active  Problem List   Diagnosis Date Noted  . Eczema of hand 10/19/2014  . Type 2 diabetes mellitus ([place]) 06/13/2013  . HTN (hypertension) 06/13/2013  . Normal coronary arteries- 2008 06/13/2013  . Chest pain with moderate risk of acute coronary syndrome 06/12/2013  . Special screening for malignant neoplasms, colon 06/27/2011  . Arthritis of knee 06/14/2011   Past Medical History:  Diagnosis Date  . Arthritis   . Constipation   . Diabetes mellitus    type 2  . Dizziness   . Headache(784.0)   . Hyperlipidemia   . Hypertension    benign   Past Surgical History:  Procedure Laterality Date  . COLONOSCOPY    . CORONARY ANGIOPLASTY    . HEMORRHOID SURGERY    . right femeroral bypass    . right knee surgery     Not on File Prior to Admission medications   Medication Sig Start Date End Date Taking? Authorizing Provider  acetaminophen (TYLENOL) 500 MG tablet Take 500 mg by mouth 3 (three) times daily as needed for mild pain.     Historical Provider, MD  aspirin 81 MG tablet Take 81 mg by mouth every morning.     Historical Provider, MD  atorvastatin (LIPITOR) 10 MG tablet TAKE ONE TABLET BY MOUTH ONCE DAILY 10/02/15   [name], MD  Calcium Carbonate Antacid (TUMS ULTRA  1000 PO) Take 1 tablet by mouth 3 (three) times daily as needed (indigestion).    Historical Provider, MD  carbamide peroxide (DEBROX) 6.5 % otic solution Place 5 drops into the right ear 2 (two) times daily as needed (ear wax).    Historical Provider, MD  cetirizine (ZYRTEC) 10 MG tablet Take 10 mg by mouth daily as needed for allergies.     Historical Provider, MD  Cyanocobalamin (VITAMIN B 12 PO) Take 1,000 mg by mouth daily.    Historical Provider, MD  fluticasone (FLONASE) 50 MCG/ACT nasal spray Place 2 sprays into both nostrils daily. Patient taking differently: Place 2 sprays into both nostrils daily as needed for allergies.  10/17/14   [name], MD  lisinopril-hydrochlorothiazide (PRINZIDE,ZESTORETIC)  20-25 MG tablet Take 1 tablet by mouth daily. 10/02/15   [name], MD  metFORMIN (GLUCOPHAGE) 500 MG tablet TAKE TWO TABLETS BY MOUTH IN THE MORNING WITH A MEAL AND ONE IN THE EVENING WITH A MEAL 02/06/16   [name], MD  pantoprazole (PROTONIX) 40 MG tablet Take 1 tablet (40 mg total) by mouth daily. 10/02/15   [name], MD  sucralfate (CARAFATE) 1 GM/10ML suspension Take 10 mLs (1 g total) by mouth 4 (four) times daily -  with meals and at bedtime. 10/11/15   Merrily Pew, MD  triamcinolone cream (KENALOG) 0.1 % Apply 1 application topically 2 (two) times daily. 10/17/14   [name], MD   Social History   Social History  . Marital status: Married    Spouse name: N/A  . Number of children: N/A  . Years of education: N/A   Occupational History  . Not on file.   Social History Main Topics  . Smoking status: Former Research scientist (life sciences)  . Smokeless tobacco: Not on file  . Alcohol use No  . Drug use: No  . Sexual activity: Not on file   Other Topics Concern  . Not on file   Social History Narrative  . No narrative on file   Depression screen [facility] 2/9 02/08/2016 10/18/2015 10/02/2015 05/08/2015 05/01/2015  Decreased Interest 0 0 0 0 0  Down, Depressed, Hopeless 0 0 0 0 0  PHQ - 2 Score 0 0 0 0 0   Review of Systems  Constitutional: Negative for fatigue and unexpected weight change.  Eyes: Negative for visual disturbance.  Respiratory: Negative for cough, chest tightness and shortness of breath.   Cardiovascular: Negative for chest pain, palpitations and leg swelling.  Gastrointestinal: Negative for abdominal pain and blood in stool.  Musculoskeletal: Positive for arthralgias.  Skin: Positive for rash (improved since initial onset).  Neurological: Negative for dizziness, light-headedness and headaches.   Objective:  Physical Exam  Constitutional: He is oriented to person, place, and time. He appears well-developed and well-nourished.  HENT:  Head: Normocephalic and  atraumatic.  Eyes: EOM are normal. Pupils are equal, round, and reactive to light.  Neck: No JVD present. Carotid bruit is not present.  Cardiovascular: Normal rate, regular rhythm and normal heart sounds.   No murmur heard. Pulmonary/Chest: Effort normal and breath sounds normal. No respiratory distress. He has no wheezes. He has no rales.  Musculoskeletal: He exhibits no edema (lower extremity).  Tenderness in the right medial knee with crepitance No guarding  No rash No apparent effusion  Neurological: He is alert and oriented to person, place, and time.  Microfilament testing intact distally  Skin: Skin is warm and dry. No rash noted.  Psychiatric:  He has a normal mood and affect.  Vitals reviewed.  BP 124/80 (BP Location: Left Arm, Patient Position: Sitting, Cuff Size: Normal)   Pulse 70   Temp 98.9 F (37.2 C) (Oral)   Resp 16   Ht 6\' 1"  (1.854 m)   Wt 202 lb (91.6 kg)   SpO2 97%   BMI 26.65 kg/m  Assessment & Plan:   Brendan Gonzales is a 60 y.o. male Type 2 diabetes mellitus without complication, without long-term current use of insulin ([place]) - Plan: Hemoglobin A1C  - Check A1c. No change in medications for now. Advised to watch diet and recheck in 3 months.  Primary osteoarthritis of right knee - Plan: diclofenac sodium (VOLTAREN) 1 % GEL, Right knee pain - Plan: diclofenac sodium (VOLTAREN) 1 % GEL  - Tricompartmental arthritis. Previous recommended injections from orthopedics cost prohibitive, but that coverage may have changed. Advised for him to call orthopedics for follow-up appointment to discuss options, but in the meantime can try topical Voltaren 4 times a day. Tylenol over-the-counter as needed.  Hyperlipidemia - Plan: COMPLETE METABOLIC PANEL WITH GFR, Lipid panel  - labs pending. No change in meds for now.  Meds ordered this encounter  Medications  . diclofenac sodium (VOLTAREN) 1 % GEL    Sig: Apply 4 g topically 4 (four) times daily. To R knee    Dispense:   100 g    Refill:  1   Patient Instructions    No change in medications for now. I will let you know once the lab results return if medication changes are needed. Make sure you're watching your diet choices as this can also affect your diabetes control.  Try the new gel for your right knee pain, and follow-up with orthopedics to discuss other options. Tylenol is okay to take if needed.  Return to the clinic or go to the nearest emergency room if any of your symptoms worsen or new symptoms occur.      IF you received an x-ray today, you will receive an invoice from [facility]. Please contact [facility]iology at [phone] with questions or concerns regarding your invoice.   IF you received labwork today, you will receive an invoice from [employer]. Please contact [facility] at [phone] with questions or concerns regarding your invoice.   Our billing staff will not be able to assist you with questions regarding bills from these companies.  You will be contacted with the lab results as soon as they are available. The fastest way to get your results is to activate your My Chart account. Instructions are located on the last page of this paperwork. If you have not heard from [place] regarding the results in 2 weeks, please contact this office.        I personally performed the services described in this documentation, which was scribed in my presence. The recorded information has been reviewed and considered, and addended by me as needed.   Signed,   [name], MD [facility].  02/08/16 1:50 PM

## 2016-02-13 NOTE — Telephone Encounter (Signed)
Got notice from pharm that diclofenac gel needs PA. Completed on covermymeds and was approved through 02/12/17. Notified pharm and pt on VM.

## 2016-02-20 ENCOUNTER — Ambulatory Visit (INDEPENDENT_AMBULATORY_CARE_PROVIDER_SITE_OTHER): Payer: BLUE CROSS/BLUE SHIELD | Admitting: Family Medicine

## 2016-02-20 ENCOUNTER — Ambulatory Visit (INDEPENDENT_AMBULATORY_CARE_PROVIDER_SITE_OTHER): Payer: BLUE CROSS/BLUE SHIELD

## 2016-02-20 VITALS — BP 102/66 | HR 74 | Temp 98.0°F | Resp 18 | Ht 73.0 in | Wt 205.0 lb

## 2016-02-20 DIAGNOSIS — L309 Dermatitis, unspecified: Secondary | ICD-10-CM

## 2016-02-20 DIAGNOSIS — R10819 Abdominal tenderness, unspecified site: Secondary | ICD-10-CM | POA: Diagnosis not present

## 2016-02-20 DIAGNOSIS — N410 Acute prostatitis: Secondary | ICD-10-CM | POA: Diagnosis not present

## 2016-02-20 DIAGNOSIS — R300 Dysuria: Secondary | ICD-10-CM | POA: Diagnosis not present

## 2016-02-20 LAB — POCT URINALYSIS DIP (MANUAL ENTRY)
BILIRUBIN UA: NEGATIVE
GLUCOSE UA: NEGATIVE
Ketones, POC UA: NEGATIVE
LEUKOCYTES UA: NEGATIVE
Nitrite, UA: NEGATIVE
PH UA: 5.5
Protein Ur, POC: NEGATIVE
SPEC GRAV UA: 1.01
UROBILINOGEN UA: 0.2

## 2016-02-20 LAB — POC MICROSCOPIC URINALYSIS (UMFC): Mucus: ABSENT

## 2016-02-20 LAB — PSA: PSA: 1 ng/mL (ref <=4.0)

## 2016-02-20 LAB — GLUCOSE, POCT (MANUAL RESULT ENTRY): POC GLUCOSE: 208 mg/dL — AB (ref 70–99)

## 2016-02-20 MED ORDER — SULFAMETHOXAZOLE-TRIMETHOPRIM 800-160 MG PO TABS: 1.0000 | ORAL_TABLET | Freq: Two times a day (BID) | ORAL | 0 refills | Status: AC

## 2016-02-20 MED ORDER — CLOTRIMAZOLE-BETAMETHASONE 1-0.05 % EX CREA: 1.0000 "application " | TOPICAL_CREAM | Freq: Two times a day (BID) | CUTANEOUS | 0 refills | Status: DC

## 2016-02-20 NOTE — Progress Notes (Signed)
Patient ID: Brendan Gonzales, male    DOB: 1956-05-21, 60 y.o.   MRN: [id number]  PCP: [name], MD  Chief Complaint  Patient presents with  . Dysuria    Subjective:   HPI Presents for evaluation of dysuria.  60 year-old male presents with a complaint of dysuria.  He reports burning with urination, reports feeling feverish without a measurable fever, and lower, aching,  abdominal pain times 4-5 days.  He also reports experiencing pain during sex.  He denies nausea and vomiting.  Rash Skin rash bilateral legs received an antifungal cream while traveling out of the country. Rash began to resolve, cream ran out prior to complete resolution.  Patient has a history of dermatitis. He describes rash as itchy, skin intact, non-vesicular lesion.   Review of Systems  Constitutional: Positive for fatigue.       SEE HPI  Respiratory: Negative.   Cardiovascular: Negative.   Gastrointestinal: Positive for abdominal pain.  Genitourinary: Positive for dysuria, frequency and urgency. Negative for difficulty urinating, penile swelling, scrotal swelling and testicular pain.       See HPI  Neurological: Negative for dizziness and headaches.  Psychiatric/Behavioral: Negative.     Patient Active Problem List   Diagnosis Date Noted  . Eczema of hand 10/19/2014  . Type 2 diabetes mellitus ([place]) 06/13/2013  . HTN (hypertension) 06/13/2013  . Normal coronary arteries- 2008 06/13/2013  . Chest pain with moderate risk of acute coronary syndrome 06/12/2013  . Special screening for malignant neoplasms, colon 06/27/2011  . Arthritis of knee 06/14/2011     Prior to Admission medications   Medication Sig Start Date End Date Taking? Authorizing Provider  acetaminophen (TYLENOL) 500 MG tablet Take 500 mg by mouth 3 (three) times daily as needed for mild pain.    Yes Historical Provider, MD  aspirin 81 MG tablet Take 81 mg by mouth every morning.    Yes Historical Provider, MD  atorvastatin  (LIPITOR) 10 MG tablet TAKE ONE TABLET BY MOUTH ONCE DAILY 02/08/16  Yes [name], MD  Calcium Carbonate Antacid (TUMS ULTRA 1000 PO) Take 1 tablet by mouth 3 (three) times daily as needed (indigestion).   Yes Historical Provider, MD  carbamide peroxide (DEBROX) 6.5 % otic solution Place 5 drops into the right ear 2 (two) times daily as needed (ear wax).   Yes Historical Provider, MD  cetirizine (ZYRTEC) 10 MG tablet Take 10 mg by mouth daily as needed for allergies.    Yes Historical Provider, MD  Cyanocobalamin (VITAMIN B 12 PO) Take 1,000 mg by mouth daily.   Yes Historical Provider, MD  diclofenac sodium (VOLTAREN) 1 % GEL Apply 4 g topically 4 (four) times daily. To R knee 02/08/16  Yes [name], MD  fluticasone [facility]) 50 MCG/ACT nasal spray Place 2 sprays into both nostrils daily. 10/17/14  Yes [name], MD  lisinopril-hydrochlorothiazide (PRINZIDE,ZESTORETIC) 20-25 MG tablet Take 1 tablet by mouth daily. 02/08/16  Yes [name], MD  metFORMIN (GLUCOPHAGE) 500 MG tablet TAKE TWO TABLETS BY MOUTH IN THE MORNING WITH A MEAL AND ONE IN THE EVENING WITH A MEAL 02/08/16  Yes [name], MD  pantoprazole (PROTONIX) 40 MG tablet Take 1 tablet (40 mg total) by mouth daily. 10/02/15  Yes [name], MD  sucralfate (CARAFATE) 1 GM/10ML suspension Take 10 mLs (1 g total) by mouth 4 (four) times daily -  with meals and at bedtime. 10/11/15  Yes Merrily Pew, MD  triamcinolone cream (KENALOG) 0.1 % Apply 1 application topically 2 (two) times daily. 10/17/14  Yes [name], MD   No Known Allergies     Objective:  Physical Exam  Constitutional: He is oriented to person, place, and time. He appears well-developed and well-nourished.  HENT:  Head: Normocephalic.  Right Ear: External ear normal.  Left Ear: External ear normal.  Nose: Nose normal.  Eyes: Conjunctivae and EOM are normal. Pupils are equal, round, and reactive to light.  Neck: Normal range of  motion.  Cardiovascular: Normal rate, regular rhythm, normal heart sounds and intact distal pulses.   Pulmonary/Chest: Effort normal and breath sounds normal.  Abdominal: Soft. Bowel sounds are normal. He exhibits no distension and no mass. There is tenderness. There is no rebound and no guarding.  Abdominal tenderness left and right lower quadrant  Tenderness at umbilicus region with palpation  Genitourinary: Penis normal.  Genitourinary Comments: Tenderness noted during rectal exam Tenderness noted with attempt to exam prostate. Unable to completely assess enlargement.   Musculoskeletal: Normal range of motion.  Neurological: He is alert and oriented to person, place, and time.  Skin: Skin is warm and dry. Rash noted.  Two erythremic flat patch lesion on upper thighs. Non-vesicular lesions.    .Dg Abd 2 Views  Result Date: 02/20/2016 CLINICAL DATA:  Abdominal tenderness with palpation for more than 1 week EXAM: ABDOMEN - 2 VIEW COMPARISON:  Lumbar spine x-rays 01/29/2015 FINDINGS: The bowel gas pattern is normal. There is no evidence of free air. No radio-opaque calculi or other significant radiographic abnormality is seen. IMPRESSION: Negative. Electronically Signed   By: Franchot Gallo M.D.   On: 02/20/2016 10:09     Assessment & Plan:  1. Dysuria 2. Prostatitis, acute Dysuria, abdominal pain, feverish, likely related acute prostatitis.  Treat with a 14 day course of antibiotic.  Marland Kitchen sulfamethoxazole-trimethoprim (BACTRIM DS,SEPTRA DS) 800-160 MG tablet    Sig: Take 1 tablet by mouth 2 (two) times daily.   Follow-up as needed or if symptoms do not improve.  3. Dermatitis, apply to rash on  thighs twice daily for two weeks. . clotrimazole-betamethasone (LOTRISONE) cream    Sig: Apply 1 application topically 2 (two) times daily. 2 tubes of creams 15 g each tube    4. Abdominal tenderness - DG Abd 2 Views, likely related to acute prostatitis. See acute prostatitis  treatment  Carroll Sage. Kenton Kingfisher, MSN, FNP-C [facility]

## 2016-02-20 NOTE — Patient Instructions (Addendum)
Start Bactrim 2 times daily for 14 day. Complete all medication. If symptoms persists after completion of antibiotics follow-up for additional care. Drinks 6-8 ounces of water daily. Will follow-up with results of lab work.   IF you received an x-ray today, you will receive an invoice from [facility]. Please contact [facility] at [phone] with questions or concerns regarding your invoice.   IF you received labwork today, you will receive an invoice from [employer]. Please contact [facility] at [phone] with questions or concerns regarding your invoice.   Our billing staff will not be able to assist you with questions regarding bills from these companies.  You will be contacted with the lab results as soon as they are available. The fastest way to get your results is to activate your My Chart account. Instructions are located on the last page of this paperwork. If you have not heard from [place] regarding the results in 2 weeks, please contact this office.    prostat Prostatitis The prostate gland is about the size and shape of a walnut. It is located just below your bladder. It produces one of the components of semen, which is made up of sperm and the fluids that help nourish and transport it out from the testicles. Prostatitis is inflammation of the prostate gland.  There are four types of prostatitis:  Acute bacterial prostatitis. This is the least common type of prostatitis. It starts quickly and usually is associated with a bladder infection, high fever, and shaking chills. It can occur at any age.  Chronic bacterial prostatitis. This is a persistent bacterial infection in the prostate. It usually develops from repeated acute bacterial prostatitis or acute bacterial prostatitis that was not properly treated. It can occur in men of any age but is most common in middle-aged men whose prostate has begun to enlarge. The symptoms are not as severe  as those in acute bacterial prostatitis. Discomfort in the part of your body that is in front of your rectum and below your scrotum (perineum), lower abdomen, or in the head of your penis (glans) may represent your primary discomfort.  Chronic prostatitis (nonbacterial). This is the most common type of prostatitis. It is inflammation of the prostate gland that is not caused by a bacterial infection. The cause is unknown and may be associated with a viral infection or autoimmune disorder.  Prostatodynia (pelvic floor disorder). This is associated with increased muscular tone in the pelvis surrounding the prostate. CAUSES The causes of bacterial prostatitis are bacterial infection. The causes of the other types of prostatitis are unknown.  SYMPTOMS  Symptoms can vary depending upon the type of prostatitis that exists. There can also be overlap in symptoms. Possible symptoms for each type of prostatitis are listed below. Acute Bacterial Prostatitis  Painful urination.  Fever or chills.  Muscle or joint pains.  Low back pain.  Low abdominal pain.  Inability to empty bladder completely. Chronic Bacterial Prostatitis, Chronic Nonbacterial Prostatitis, and Prostatodynia  Sudden urge to urinate.  Frequent urination.  Difficulty starting urine stream.  Weak urine stream.  Discharge from the urethra.  Dribbling after urination.  Rectal pain.  Pain in the testicles, penis, or tip of the penis.  Pain in the perineum.  Problems with sexual function.  Painful ejaculation.  Bloody semen. DIAGNOSIS  In order to diagnose prostatitis, your health care provider will ask about your symptoms. One or more urine samples will be taken and tested (urinalysis). If the urinalysis result is  negative for bacteria, your health care provider may use a finger to feel your prostate (digital rectal exam). This exam helps your health care provider determine if your prostate is swollen and tender. It  will also produce a specimen of semen that can be analyzed. TREATMENT  Treatment for prostatitis depends on the cause. If a bacterial infection is the cause, it can be treated with antibiotic medicine. In cases of chronic bacterial prostatitis, the use of antibiotics for up to 1 month or 6 weeks may be necessary. Your health care provider may instruct you to take sitz baths to help relieve pain. A sitz bath is a bath of hot water in which your hips and buttocks are under water. This relaxes the pelvic floor muscles and often helps to relieve the pressure on your prostate. HOME CARE INSTRUCTIONS   Take all medicines as directed by your health care provider.  Take sitz baths as directed by your health care provider. SEEK MEDICAL CARE IF:   Your symptoms get worse, not better.  You have a fever. SEEK IMMEDIATE MEDICAL CARE IF:   You have chills.  You feel nauseous or vomit.  You feel lightheaded or faint.  You are unable to urinate.  You have blood or blood clots in your urine. MAKE SURE YOU:  Understand these instructions.  Will watch your condition.  Will get help right away if you are not doing well or get worse.   This information is not intended to replace advice given to you by your health care provider. Make sure you discuss any questions you have with your health care provider.   Document Released: 06/07/2000 Document Revised: 07/01/2014 Document Reviewed: 12/28/2012 Elsevier Interactive Patient Education [employer].

## 2016-02-21 ENCOUNTER — Encounter: Payer: Self-pay | Admitting: Family Medicine

## 2016-02-21 LAB — URINE CULTURE: ORGANISM ID, BACTERIA: NO GROWTH

## 2016-04-10 ENCOUNTER — Other Ambulatory Visit: Payer: Self-pay

## 2016-04-10 DIAGNOSIS — IMO0001 Reserved for inherently not codable concepts without codable children: Secondary | ICD-10-CM

## 2016-04-10 DIAGNOSIS — E1165 Type 2 diabetes mellitus with hyperglycemia: Principal | ICD-10-CM

## 2016-04-10 MED ORDER — METFORMIN HCL 500 MG PO TABS: ORAL_TABLET | ORAL | 0 refills | Status: DC

## 2016-04-10 NOTE — Telephone Encounter (Signed)
Pt seen 02/08/16 with lab for dm, recheck in 3 months. One month refill given with a note to make appointment.

## 2016-07-27 ENCOUNTER — Encounter (HOSPITAL_COMMUNITY): Payer: Self-pay | Admitting: Emergency Medicine

## 2016-07-27 ENCOUNTER — Emergency Department (HOSPITAL_COMMUNITY)
Admission: EM | Admit: 2016-07-27 | Discharge: 2016-07-27 | Disposition: A | Discharge status: Home / Self Care | Payer: BLUE CROSS/BLUE SHIELD | Attending: Emergency Medicine | Admitting: Emergency Medicine

## 2016-07-27 DIAGNOSIS — I10 Essential (primary) hypertension: Secondary | ICD-10-CM | POA: Insufficient documentation

## 2016-07-27 DIAGNOSIS — Z87891 Personal history of nicotine dependence: Secondary | ICD-10-CM | POA: Diagnosis not present

## 2016-07-27 DIAGNOSIS — M25562 Pain in left knee: Secondary | ICD-10-CM | POA: Insufficient documentation

## 2016-07-27 DIAGNOSIS — J111 Influenza due to unidentified influenza virus with other respiratory manifestations: Secondary | ICD-10-CM | POA: Diagnosis not present

## 2016-07-27 DIAGNOSIS — X501XXA Overexertion from prolonged static or awkward postures, initial encounter: Secondary | ICD-10-CM | POA: Diagnosis not present

## 2016-07-27 DIAGNOSIS — Y990 Civilian activity done for income or pay: Secondary | ICD-10-CM | POA: Diagnosis not present

## 2016-07-27 DIAGNOSIS — E119 Type 2 diabetes mellitus without complications: Secondary | ICD-10-CM | POA: Insufficient documentation

## 2016-07-27 DIAGNOSIS — M545 Low back pain, unspecified: Secondary | ICD-10-CM

## 2016-07-27 DIAGNOSIS — Z79899 Other long term (current) drug therapy: Secondary | ICD-10-CM | POA: Diagnosis not present

## 2016-07-27 DIAGNOSIS — M25561 Pain in right knee: Secondary | ICD-10-CM | POA: Diagnosis not present

## 2016-07-27 DIAGNOSIS — Y939 Activity, unspecified: Secondary | ICD-10-CM | POA: Diagnosis not present

## 2016-07-27 DIAGNOSIS — G8929 Other chronic pain: Secondary | ICD-10-CM | POA: Diagnosis not present

## 2016-07-27 DIAGNOSIS — Z7982 Long term (current) use of aspirin: Secondary | ICD-10-CM | POA: Diagnosis not present

## 2016-07-27 DIAGNOSIS — R69 Illness, unspecified: Secondary | ICD-10-CM

## 2016-07-27 DIAGNOSIS — Y929 Unspecified place or not applicable: Secondary | ICD-10-CM | POA: Diagnosis not present

## 2016-07-27 DIAGNOSIS — Z7984 Long term (current) use of oral hypoglycemic drugs: Secondary | ICD-10-CM | POA: Insufficient documentation

## 2016-07-27 LAB — CBG MONITORING, ED: Glucose-Capillary: 121 mg/dL — ABNORMAL HIGH (ref 65–99)

## 2016-07-27 MED ORDER — NAPROXEN 500 MG PO TABS: 500.0000 mg | ORAL_TABLET | Freq: Two times a day (BID) | ORAL | 0 refills | Status: DC

## 2016-07-27 MED ORDER — ONDANSETRON 8 MG PO TBDP
8.0000 mg | ORAL_TABLET | Freq: Once | ORAL | Status: AC
  Administered 2016-07-27: 8 mg via ORAL
  Filled 2016-07-27: qty 1

## 2016-07-27 MED ORDER — OSELTAMIVIR PHOSPHATE 75 MG PO CAPS: 75.0000 mg | ORAL_CAPSULE | Freq: Two times a day (BID) | ORAL | 0 refills | Status: DC

## 2016-07-27 MED ORDER — KETOROLAC TROMETHAMINE 60 MG/2ML IM SOLN
60.0000 mg | Freq: Once | INTRAMUSCULAR | Status: AC
  Administered 2016-07-27: 60 mg via INTRAMUSCULAR
  Filled 2016-07-27: qty 2

## 2016-07-27 MED ORDER — METHOCARBAMOL 500 MG PO TABS: 500.0000 mg | ORAL_TABLET | Freq: Two times a day (BID) | ORAL | 0 refills | Status: DC

## 2016-07-27 NOTE — ED Triage Notes (Signed)
Patient is having left hip pain. Patient also states both knees and head hurts. Patient also feel nauseated.

## 2016-07-27 NOTE — ED Provider Notes (Signed)
[facility] DEPT Provider Note   CSN: [id number] Arrival date & time: 07/27/16  0241     History   Chief Complaint Chief Complaint  Patient presents with  . Hip Pain  . Knee Pain    HPI Brendan Gonzales is a 61 y.o. male with a hx of Arthritis, chronic knee pain, non-insulin-dependent diabetes, chronic headache, hypertension presents to the Emergency Department complaining of gradual, persistent, progressively worsening left hip, left low back and right knee pain onset approximately 5 days ago after beginning a new job where he is on his feet, lifting and pulling more than usual. Patient reports he's had pain in his bilateral knees for the last 6 years. He's had arthroscopic surgery but no knee replacements. He reports that several years ago he had a problem with his left hip but that resolved and he has been fine until a few days ago. He reports using Tylenol when he 4 hours ago without relief. No ibuprofen usage. Patient does report Voltaren gel on the right knee without significant relief yesterday. Standing and walking make the symptoms worse. Nothing makes them better. Patient denies falls or trauma. Denies use of blood thinners, history of cancer or IV drug use. Patient denies fevers or chills.  Patient does also endorse rhinorrhea, nasal congestion, postnasal drip, cough,  and nausea for the last 2-3 days. Patient reports that many people at work have been sick. He did not get a flu shot this year. He denies fevers at home and does not have a fever here in the emergency department..  The history is provided by the patient and medical records. No language interpreter was used.    Past Medical History:  Diagnosis Date  . Arthritis   . Constipation   . Diabetes mellitus    type 2  . Dizziness   . Headache(784.0)   . Hyperlipidemia   . Hypertension    benign    Patient Active Problem List   Diagnosis Date Noted  . Eczema of hand 10/19/2014  . Type 2 diabetes mellitus ([place])  06/13/2013  . HTN (hypertension) 06/13/2013  . Normal coronary arteries- 2008 06/13/2013  . Chest pain with moderate risk of acute coronary syndrome 06/12/2013  . Special screening for malignant neoplasms, colon 06/27/2011  . Arthritis of knee 06/14/2011    Past Surgical History:  Procedure Laterality Date  . COLONOSCOPY    . CORONARY ANGIOPLASTY    . HEMORRHOID SURGERY    . right femeroral bypass    . right knee surgery         Home Medications    Prior to Admission medications   Medication Sig Start Date End Date Taking? Authorizing Provider  acetaminophen (TYLENOL) 500 MG tablet Take 500 mg by mouth 3 (three) times daily as needed for mild pain.     Historical Provider, MD  aspirin 81 MG tablet Take 81 mg by mouth every morning.     Historical Provider, MD  atorvastatin (LIPITOR) 10 MG tablet TAKE ONE TABLET BY MOUTH ONCE DAILY 02/08/16   [name], MD  Calcium Carbonate Antacid (TUMS ULTRA 1000 PO) Take 1 tablet by mouth 3 (three) times daily as needed (indigestion).    Historical Provider, MD  carbamide peroxide (DEBROX) 6.5 % otic solution Place 5 drops into the right ear 2 (two) times daily as needed (ear wax).    Historical Provider, MD  cetirizine (ZYRTEC) 10 MG tablet Take 10 mg by mouth daily as needed for allergies.  Historical Provider, MD  clotrimazole-betamethasone (LOTRISONE) cream Apply 1 application topically 2 (two) times daily. 2 tubes of creams 15 g each tube 02/20/16   Sedalia Muta, FNP  Cyanocobalamin (VITAMIN B 12 PO) Take 1,000 mg by mouth daily.    Historical Provider, MD  diclofenac sodium (VOLTAREN) 1 % GEL Apply 4 g topically 4 (four) times daily. To R knee 02/08/16   [name], MD  fluticasone [facility]) 50 MCG/ACT nasal spray Place 2 sprays into both nostrils daily. 10/17/14   [name], MD  lisinopril-hydrochlorothiazide (PRINZIDE,ZESTORETIC) 20-25 MG tablet Take 1 tablet by mouth daily. 02/08/16   [name], MD    metFORMIN (GLUCOPHAGE) 500 MG tablet TAKE TWO TABLETS BY MOUTH IN THE MORNING WITH A MEAL AND ONE IN THE EVENING WITH A MEAL 04/10/16   [name], MD  methocarbamol (ROBAXIN) 500 MG tablet Take 1 tablet (500 mg total) by mouth 2 (two) times daily. 07/27/16   Raiyan Dalesandro, PA-C  naproxen (NAPROSYN) 500 MG tablet Take 1 tablet (500 mg total) by mouth 2 (two) times daily with a meal. 07/27/16   Toccara Alford, PA-C  oseltamivir (TAMIFLU) 75 MG capsule Take 1 capsule (75 mg total) by mouth every 12 (twelve) hours. 07/27/16   Sanai Frick, PA-C  pantoprazole (PROTONIX) 40 MG tablet Take 1 tablet (40 mg total) by mouth daily. 10/02/15   [name], MD  sucralfate (CARAFATE) 1 GM/10ML suspension Take 10 mLs (1 g total) by mouth 4 (four) times daily -  with meals and at bedtime. 10/11/15   Merrily Pew, MD  triamcinolone cream (KENALOG) 0.1 % Apply 1 application topically 2 (two) times daily. 10/17/14   [name], MD    Family History Family History  Problem Relation Age of Onset  . Diabetes Other   . Hypertension Other   . Anesthesia problems Neg Hx   . Hypotension Neg Hx   . Malignant hyperthermia Neg Hx   . Pseudochol deficiency Neg Hx     Social History Social History  Substance Use Topics  . Smoking status: Former Research scientist (life sciences)  . Smokeless tobacco: Never Used  . Alcohol use No     Allergies   Patient has no known allergies.   Review of Systems Review of Systems  Constitutional: Positive for fatigue. Negative for appetite change, chills and fever.  HENT: Positive for congestion, postnasal drip, rhinorrhea, sinus pressure and sore throat. Negative for ear discharge, ear pain and mouth sores.   Eyes: Negative for visual disturbance.  Respiratory: Positive for cough. Negative for chest tightness, shortness of breath, wheezing and stridor.   Cardiovascular: Negative for chest pain, palpitations and leg swelling.  Gastrointestinal: Negative for abdominal pain,  diarrhea, nausea and vomiting.  Genitourinary: Negative for dysuria, frequency, hematuria and urgency.  Musculoskeletal: Positive for arthralgias and myalgias. Negative for back pain and neck stiffness.  Skin: Negative for rash.  Neurological: Positive for headaches. Negative for syncope, light-headedness and numbness.  Hematological: Negative for adenopathy.  Psychiatric/Behavioral: The patient is not nervous/anxious.   All other systems reviewed and are negative.    Physical Exam Updated Vital Signs BP 144/80 (BP Location: Left Arm)   Pulse 75   Temp 97.9 F (36.6 C) (Oral)   Resp 16   Ht 6\' 2"  (1.88 m)   Wt 94.8 kg   SpO2 98%   BMI 26.83 kg/m   Physical Exam  Constitutional: He appears well-developed and well-nourished. No distress.  HENT:  Head:  Normocephalic and atraumatic.  Right Ear: Tympanic membrane, external ear and ear canal normal.  Left Ear: Tympanic membrane, external ear and ear canal normal.  Nose: Mucosal edema and rhinorrhea present. No epistaxis. Right sinus exhibits no maxillary sinus tenderness and no frontal sinus tenderness. Left sinus exhibits no maxillary sinus tenderness and no frontal sinus tenderness.  Mouth/Throat: Uvula is midline and mucous membranes are normal. Mucous membranes are not pale and not cyanotic. No oropharyngeal exudate, posterior oropharyngeal edema, posterior oropharyngeal erythema or tonsillar abscesses.  Eyes: Conjunctivae are normal. Pupils are equal, round, and reactive to light.  Neck: Normal range of motion and full passive range of motion without pain.  Cardiovascular: Normal rate, regular rhythm and intact distal pulses.   Capillary refill < 3 sec  Pulmonary/Chest: Effort normal and breath sounds normal. No stridor.  Clear and equal breath sounds without focal wheezes, rhonchi, rales  Abdominal: Soft. There is no tenderness.  Musculoskeletal: Normal range of motion. He exhibits tenderness. He exhibits no edema.       Right  knee: He exhibits normal range of motion, no swelling, no effusion, no ecchymosis, no deformity, no laceration and no erythema. No tenderness found. No medial joint line and no lateral joint line tenderness noted.       Left knee: He exhibits normal range of motion, no swelling, no effusion, no ecchymosis, no deformity, no laceration and no erythema. No tenderness found. No medial joint line and no lateral joint line tenderness noted.  Patient reports pain along the medial and lateral joint lines of the bilateral knees this is not reproduced with palpation. Full range of motion of the bilateral hips knees and ankles. Tenderness to palpation along the lower left L-spine and over the SI joint. No midline tenderness of the L-spine. No midline or paraspinal tenderness of the C-spine or T-spine.  Lymphadenopathy:    He has no cervical adenopathy.  Neurological: He is alert. Coordination normal.  Sensation intact to normal touch in the bilateral lower legs Strength 5/5 with flexion and extension at the hips, knees, ankles and the toe  Skin: Skin is warm and dry. No rash noted. He is not diaphoretic.  No tenting of the skin  Psychiatric: He has a normal mood and affect.  Nursing note and vitals reviewed.    ED Treatments / Results  Labs (all labs ordered are listed, but only abnormal results are displayed) Labs Reviewed  CBG MONITORING, ED - Abnormal; Notable for the following:       Result Value   Glucose-Capillary 121 (*)    All other components within normal limits     Procedures Procedures (including critical care time)  Medications Ordered in ED Medications  ondansetron (ZOFRAN-ODT) disintegrating tablet 8 mg (not administered)  ketorolac (TORADOL) injection 60 mg (not administered)     Initial Impression / Assessment and Plan / ED Course  I have reviewed the triage vital signs and the nursing notes.  Pertinent labs & imaging results that were available during my care of the  patient were reviewed by me and considered in my medical decision making (see chart for details).     Patient presents with influenza-like illness including myalgias. Patient is afebrile without tachycardia. Highly doubt sepsis, stable vital signs. Patient does have diabetes. Will be given Tamiflu. No signs of dehydration, tolerating PO's.  Lungs are clear. Due to patient's presentation and physical exam a chest x-ray was not ordered bc likely diagnosis of flu.  Patient will be discharged  with instructions to orally hydrate, rest, and use over-the-counter medications such as anti-inflammatories ibuprofen and Aleve for muscle aches and Tylenol for fever.  Patient will also be given a cough suppressant.   Patient also with low back pain and bilateral knee pain.  No neurological deficits and normal neuro exam.  Patient can walk with his cane but states is painful.  No loss of bowel or bladder control.  No concern for cauda equina.  No fever, night sweats, weight loss, h/o cancer, IVDU.  Full range of motion of bilateral hips. No induration, increased warmth to the hip. Highly doubt septic joint. RICE protocol and pain medicine indicated and discussed with patient.    Final Clinical Impressions(s) / ED Diagnoses   Final diagnoses:  Chronic pain of both knees  Acute left-sided low back pain without sciatica  Influenza-like illness    New Prescriptions New Prescriptions   METHOCARBAMOL (ROBAXIN) 500 MG TABLET    Take 1 tablet (500 mg total) by mouth 2 (two) times daily.   NAPROXEN (NAPROSYN) 500 MG TABLET    Take 1 tablet (500 mg total) by mouth 2 (two) times daily with a meal.   OSELTAMIVIR (TAMIFLU) 75 MG CAPSULE    Take 1 capsule (75 mg total) by mouth every 12 (twelve) hours.     Jarrett Soho Lavante Toso, PA-C [id number]    David Glick, MD [id number]

## 2016-07-27 NOTE — Discharge Instructions (Signed)
1. Medications: robaxin, naproxyn, Tamiflu, usual home medications 2. Treatment: rest, drink plenty of fluids, gentle stretching as discussed, alternate ice and heat 3. Follow Up: Please followup with your primary doctor in 3 days for discussion of your diagnoses and further evaluation after today's visit; if you do not have a primary care doctor use the resource guide provided to find one;  Return to the ER for worsening back pain, difficulty walking, loss of bowel or bladder control, development of fever or other concerning symptoms

## 2016-07-29 ENCOUNTER — Ambulatory Visit (INDEPENDENT_AMBULATORY_CARE_PROVIDER_SITE_OTHER): Payer: BLUE CROSS/BLUE SHIELD

## 2016-07-29 ENCOUNTER — Ambulatory Visit (INDEPENDENT_AMBULATORY_CARE_PROVIDER_SITE_OTHER): Payer: BLUE CROSS/BLUE SHIELD | Admitting: Family Medicine

## 2016-07-29 VITALS — BP 140/82 | HR 56 | Temp 97.9°F | Resp 17 | Ht 74.0 in | Wt 201.0 lb

## 2016-07-29 DIAGNOSIS — E1165 Type 2 diabetes mellitus with hyperglycemia: Secondary | ICD-10-CM

## 2016-07-29 DIAGNOSIS — E785 Hyperlipidemia, unspecified: Secondary | ICD-10-CM

## 2016-07-29 DIAGNOSIS — R69 Illness, unspecified: Secondary | ICD-10-CM | POA: Diagnosis not present

## 2016-07-29 DIAGNOSIS — M545 Low back pain, unspecified: Secondary | ICD-10-CM

## 2016-07-29 DIAGNOSIS — M25552 Pain in left hip: Secondary | ICD-10-CM

## 2016-07-29 DIAGNOSIS — J111 Influenza due to unidentified influenza virus with other respiratory manifestations: Secondary | ICD-10-CM

## 2016-07-29 DIAGNOSIS — IMO0001 Reserved for inherently not codable concepts without codable children: Secondary | ICD-10-CM

## 2016-07-29 MED ORDER — METFORMIN HCL 500 MG PO TABS: 1000.0000 mg | ORAL_TABLET | Freq: Two times a day (BID) | ORAL | 1 refills | Status: DC

## 2016-07-29 MED ORDER — ATORVASTATIN CALCIUM 10 MG PO TABS: ORAL_TABLET | ORAL | 1 refills | Status: DC

## 2016-07-29 NOTE — Patient Instructions (Addendum)
Follow-up with orthopedist as discussed for your knee pain, and possibly hip and back pain at that time as well as needed. Continue the medication that was prescribed from the emergency room for hip and back pain, but if you run out of that medicine and still having symptoms, return to discuss other options.   Continue Tamiflu for possible flulike illness, return here or other medical provider if worsening cough, shortness of breath, or fever.  I will check your diabetes test today, and cholesterol. You will likely need to be on a higher dose of medication as was recommended in August, but we can wait on the results. Make sure you see me every 3 months for diabetes.   IF you received an x-ray today, you will receive an invoice from [facility]. Please contact [facility] at [phone] with questions or concerns regarding your invoice.   IF you received labwork today, you will receive an invoice from [place]. Please contact [facility] at [phone] with questions or concerns regarding your invoice.   Our billing staff will not be able to assist you with questions regarding bills from these companies.  You will be contacted with the lab results as soon as they are available. The fastest way to get your results is to activate your My Chart account. Instructions are located on the last page of this paperwork. If you have not heard from [place] regarding the results in 2 weeks, please contact this office.

## 2016-07-29 NOTE — Progress Notes (Signed)
By signing my name below, I, Brendan Gonzales, attest that this documentation has been prepared under the direction and in the presence of Brendan Ray, MD.  Electronically Signed: Verlee Gonzales, Medical Scribe. 07/29/16. 11:20 AM.  Subjective:    Patient ID: Brendan Gonzales, male    DOB: 08/11/1955, 61 y.o.   MRN: [id number]  HPI Chief Complaint  Patient presents with  . Follow-up    Hospital    HPI Comments: Brendan Gonzales is a 61 y.o. male with a PMHx of bilateral knee pain with arthroscopic knee surgery who presents to the [facility] for ER follow-up. He was seen Feb 3rd for multiple areas of pain including left hip, left low back, and left knee pain after starting a new job with more activity. He was also having influzenza like sxs and treated with tamiflu at that time. He was treated with naprosyn and robaxin for his arthralgias. We discussed right knee pain at his visit Aug 2017 and I rx voltaren gel and advised to follow-up with orthopedics. 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  Pt has been having hip and back pain for the past week without injury. Similar sx's in past. Pt is compliant with naproxen, but he hasn't taken it today for possible blood work today. Pt is also compliant with robaxin but he still has pain when he sits downs, or bends over to wash his hands. Pt reports weakness in his hip for the past week. Pt hasn't seen an orthopedic in the past 9 months, since his last visit. He nl has biannual shots in his knees, but since his insurance doesn't pay for it he stopped going with his last shot 9 months ago. Denies bowel/urinary incontinence, or saddle anesthesia.  Flu: Reports some cough, and congestion. Pt is compliant with tamiflu. Denies emesis, and recent fever.  T2DM: Last seen in Aug, recommended 3 month follow-up. It was recommended he take metformin 1000 mg BID; 2 metformin 500  mg in the morning and 2 metformin 500 mg at night. Pt has been taking 2 metformin 500 mg in the morning and only 1 tab at night. Pt needs a refill on metformin. Lab Results  Component Value Date   HGBA1C 7.4 (H) 02/08/2016    Patient Active Problem List   Diagnosis Date Noted  . Eczema of hand 10/19/2014  . Type 2 diabetes mellitus ([place]) 06/13/2013  . HTN (hypertension) 06/13/2013  . Normal coronary arteries- 2008 06/13/2013  . Chest pain with moderate risk of acute coronary syndrome 06/12/2013  . Special screening for malignant neoplasms, colon 06/27/2011  . Arthritis of knee 06/14/2011   Past Medical History:  Diagnosis Date  . Arthritis   . Constipation   . Diabetes mellitus    type 2  . Dizziness   . Headache(784.0)   . Hyperlipidemia   . Hypertension    benign   Past Surgical History:  Procedure Laterality Date  . COLONOSCOPY    . CORONARY ANGIOPLASTY    . HEMORRHOID SURGERY    . right femeroral bypass    . right knee surgery     No Known Allergies Prior to Admission medications   Medication Sig Start Date End Date Taking? Authorizing Provider  acetaminophen (TYLENOL) 500 MG tablet Take 500 mg by mouth 3 (three) times daily as needed for mild pain.     Historical Provider, MD  aspirin 81  MG tablet Take 81 mg by mouth every morning.     Historical Provider, MD  atorvastatin (LIPITOR) 10 MG tablet TAKE ONE TABLET BY MOUTH ONCE DAILY 02/08/16   [name], MD  Calcium Carbonate Antacid (TUMS ULTRA 1000 PO) Take 1 tablet by mouth 3 (three) times daily as needed (indigestion).    Historical Provider, MD  carbamide peroxide (DEBROX) 6.5 % otic solution Place 5 drops into the right ear 2 (two) times daily as needed (ear wax).    Historical Provider, MD  cetirizine (ZYRTEC) 10 MG tablet Take 10 mg by mouth daily as needed for allergies.     Historical Provider, MD  clotrimazole-betamethasone (LOTRISONE) cream Apply 1 application topically 2 (two) times daily. 2 tubes  of creams 15 g each tube 02/20/16   Sedalia Muta, FNP  Cyanocobalamin (VITAMIN B 12 PO) Take 1,000 mg by mouth daily.    Historical Provider, MD  diclofenac sodium (VOLTAREN) 1 % GEL Apply 4 g topically 4 (four) times daily. To R knee 02/08/16   [name], MD  fluticasone [facility]) 50 MCG/ACT nasal spray Place 2 sprays into both nostrils daily. 10/17/14   [name], MD  lisinopril-hydrochlorothiazide (PRINZIDE,ZESTORETIC) 20-25 MG tablet Take 1 tablet by mouth daily. 02/08/16   [name], MD  metFORMIN (GLUCOPHAGE) 500 MG tablet TAKE TWO TABLETS BY MOUTH IN THE MORNING WITH A MEAL AND ONE IN THE EVENING WITH A MEAL 04/10/16   [name], MD  methocarbamol (ROBAXIN) 500 MG tablet Take 1 tablet (500 mg total) by mouth 2 (two) times daily. 07/27/16   Hannah Muthersbaugh, PA-C  naproxen (NAPROSYN) 500 MG tablet Take 1 tablet (500 mg total) by mouth 2 (two) times daily with a meal. 07/27/16   Hannah Muthersbaugh, PA-C  oseltamivir (TAMIFLU) 75 MG capsule Take 1 capsule (75 mg total) by mouth every 12 (twelve) hours. 07/27/16   Hannah Muthersbaugh, PA-C  pantoprazole (PROTONIX) 40 MG tablet Take 1 tablet (40 mg total) by mouth daily. 10/02/15   [name], MD  sucralfate (CARAFATE) 1 GM/10ML suspension Take 10 mLs (1 g total) by mouth 4 (four) times daily -  with meals and at bedtime. 10/11/15   Merrily Pew, MD  triamcinolone cream (KENALOG) 0.1 % Apply 1 application topically 2 (two) times daily. 10/17/14   [name], MD   Social History   Social History  . Marital status: Married    Spouse name: N/A  . Number of children: N/A  . Years of education: N/A   Occupational History  . Not on file.   Social History Main Topics  . Smoking status: Former Research scientist (life sciences)  . Smokeless tobacco: Never Used  . Alcohol use No  . Drug use: No  . Sexual activity: Not on file   Other Topics Concern  . Not on file   Social History Narrative  . No narrative on file    Review of Systems  Constitutional: Negative for fever.  HENT: Positive for congestion.   Respiratory: Positive for cough.   Gastrointestinal: Negative for nausea and vomiting.  Musculoskeletal: Positive for arthralgias and gait problem.  Neurological: Positive for weakness.   Objective:  Physical Exam  Constitutional: He is oriented to person, place, and time. He appears well-developed and well-nourished. No distress.  HENT:  Head: Normocephalic and atraumatic.  Eyes: Conjunctivae and EOM are normal. Pupils are equal, round, and reactive to light.  Neck: Neck supple. No JVD present. Carotid bruit is  not present.  Cardiovascular: Normal rate, regular rhythm and normal heart sounds.  Exam reveals no gallop and no friction rub.   No murmur heard. Pulmonary/Chest: Effort normal and breath sounds normal. No respiratory distress. He has no wheezes. He has no rales.  Musculoskeletal: He exhibits no edema.  No apparent effusion in the knees Tender at the bilateral joint line at both knees But ROM is equal right and left Complains of discomfort in the lower lumbar spine Guarded with flexion to about 80 degrees Guarded with extension Equal lateral flexion, but minimal Equal rotation Able to toe walk Able to heel walk but pain in the back Pain with internal and external rotation of the hip  Neurological: He is alert and oriented to person, place, and time.  Skin: Skin is warm and dry.  Psychiatric: He has a normal mood and affect. His behavior is normal.  Nursing note and vitals reviewed.  BP 140/82 (BP Location: Right Arm, Patient Position: Sitting, Cuff Size: Normal)   Pulse (!) 56   Temp 97.9 F (36.6 C) (Oral)   Resp 17   Ht 6\' 2"  (1.88 m)   Wt 201 lb (91.2 kg)   SpO2 99%   BMI 25.81 kg/m    Dg Lumbar Spine 2-3 Views  Result Date: 07/29/2016 CLINICAL DATA:  LEFT hip and low back pain. EXAM: LUMBAR SPINE - 2-3 VIEW COMPARISON:  Radiograph 02/20/2016 FINDINGS: Normal  alignment of lumbar vertebral bodies. No loss of vertebral body height or disc height. No pars fracture. Minimal endplate spurring. No subluxation. IMPRESSION: No acute osseous abnormality. Electronically Signed   By: Suzy Bouchard M.D.   On: 07/29/2016 12:08   Dg Hip Unilat W Or W/o Pelvis 2-3 Views Left  Result Date: 07/29/2016 CLINICAL DATA:  Left hip and low back pain for 1 week. No known injury. EXAM: DG HIP (WITH OR WITHOUT PELVIS) 2-3V LEFT COMPARISON:  None. FINDINGS: Mild symmetric osteoarthritic changes in the hips bilaterally with joint space narrowing and early spurring. SI joints are symmetric and unremarkable. No acute bony abnormality. Specifically, no fracture, subluxation, or dislocation. Soft tissues are intact. IMPRESSION: Early symmetric degenerative changes in the hips. No acute bony abnormality. Electronically Signed   By: Rolm Baptise M.D.   On: 07/29/2016 12:06   Assessment & Plan:   Markcus Twiddy is a 61 y.o. male Left hip pain - Plan: DG HIP UNILAT W OR W/O PELVIS 2-3 VIEWS LEFT Left-sided low back pain without sciatica, unspecified chronicity - Plan: DG Lumbar Spine 2-3 Views  - Previous hip arthritis noted, and arthritic/degenerative changes in hips noted again without acute findings.  - Okay to continue naproxen and Robaxin for now, follow up with orthopedist. If med refill needed for persistent pain in these areas, return to discuss other options  Type 2 diabetes mellitus with hyperglycemia, without long-term current use of insulin ([place]) - Plan: Hemoglobin A1c  -Prior uncontrolled, had not increased the dose of metformin as planned. Recheck A1c, refilled metformin at 1000 mg twice a day dosing.  Influenza-like illness  -Improved, continue Tamiflu, symptomatic care, RTC precautions  Hyperlipidemia, unspecified hyperlipidemia type - Plan: Lipid panel, Comprehensive metabolic panel  -Check lipids, CMP, continue same dose of Lipitor for now.  Meds ordered this  encounter  Medications  . Garlic [id number] MG CAPS    Sig: Take by mouth daily.   Patient Instructions   Follow-up with orthopedist as discussed for your knee pain, and possibly hip and back pain at that  time as well as needed. Continue the medication that was prescribed from the emergency room for hip and back pain, but if you run out of that medicine and still having symptoms, return to discuss other options.   Continue Tamiflu for possible flulike illness, return here or other medical provider if worsening cough, shortness of breath, or fever.  I will check your diabetes test today, and cholesterol. You will likely need to be on a higher dose of medication as was recommended in August, but we can wait on the results. Make sure you see me every 3 months for diabetes.   IF you received an x-Gonzales today, you will receive an invoice from [facility]. Please contact [facility] at [phone] with questions or concerns regarding your invoice.   IF you received labwork today, you will receive an invoice from [place]. Please contact [facility] at [phone] with questions or concerns regarding your invoice.   Our billing staff will not be able to assist you with questions regarding bills from these companies.  You will be contacted with the lab results as soon as they are available. The fastest way to get your results is to activate your My Chart account. Instructions are located on the last page of this paperwork. If you have not heard from [place] regarding the results in 2 weeks, please contact this office.       I personally performed the services described in this documentation, which was scribed in my presence. The recorded information has been reviewed and considered for accuracy and completeness, addended by me as needed, and agree with information above.  Signed,   Brendan Ray, MD [facility].  07/29/16 8:00 PM

## 2016-07-30 LAB — COMPREHENSIVE METABOLIC PANEL
A/G RATIO: 1.7 (ref 1.2–2.2)
ALK PHOS: 101 IU/L (ref 39–117)
ALT: 13 IU/L (ref 0–44)
AST: 14 IU/L (ref 0–40)
Albumin: 4.5 g/dL (ref 3.5–5.5)
BUN/Creatinine Ratio: 10 (ref 9–20)
BUN: 8 mg/dL (ref 6–24)
Bilirubin Total: 0.3 mg/dL (ref 0.0–1.2)
CHLORIDE: 97 mmol/L (ref 96–106)
CO2: 24 mmol/L (ref 18–29)
CREATININE: 0.81 mg/dL (ref 0.76–1.27)
Calcium: 9.4 mg/dL (ref 8.7–10.2)
GFR calc Af Amer: 112 mL/min/{1.73_m2} (ref >59)
GFR calc non Af Amer: 97 mL/min/{1.73_m2} (ref >59)
GLOBULIN, TOTAL: 2.7 g/dL (ref 1.5–4.5)
Glucose: 130 mg/dL — ABNORMAL HIGH (ref 65–99)
POTASSIUM: 4.1 mmol/L (ref 3.5–5.2)
SODIUM: 141 mmol/L (ref 134–144)
Total Protein: 7.2 g/dL (ref 6.0–8.5)

## 2016-07-30 LAB — LIPID PANEL
CHOLESTEROL TOTAL: 166 mg/dL (ref 100–199)
Chol/HDL Ratio: 4 ratio units (ref 0.0–5.0)
HDL: 42 mg/dL (ref >39)
LDL CALC: 86 mg/dL (ref 0–99)
Triglycerides: 191 mg/dL — ABNORMAL HIGH (ref 0–149)
VLDL CHOLESTEROL CAL: 38 mg/dL (ref 5–40)

## 2016-07-30 LAB — HEMOGLOBIN A1C
ESTIMATED AVERAGE GLUCOSE: 151 mg/dL
HEMOGLOBIN A1C: 6.9 % — AB (ref 4.8–5.6)

## 2016-09-05 ENCOUNTER — Other Ambulatory Visit: Payer: Self-pay | Admitting: Family Medicine

## 2016-09-05 DIAGNOSIS — I10 Essential (primary) hypertension: Secondary | ICD-10-CM

## 2016-11-02 IMAGING — NM NM MISC PROCEDURE
6 series · 36 of 36 positions shown · non-contrast
Comparison: none

[Series 1: wbr rest · 6.40mm/px · 6 of 64 frames shown]
[frame 6/64]
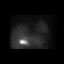
[frame 16/64]
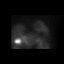
[frame 27/64]
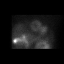
[frame 38/64]
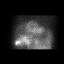
[frame 48/64]
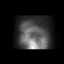
[frame 59/64]
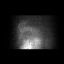

[Series 1: wbr_r-proj_st wbr rest · 6.40mm/px · 6 of 64 frames shown]
[frame 6/64]
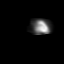
[frame 16/64]
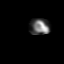
[frame 27/64]
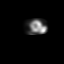
[frame 38/64]
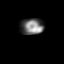
[frame 48/64]
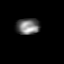
[frame 59/64]
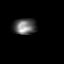

[Series 2: wbr_s-proj_st wbr stress-gsp · 6.40mm/px · 6 of 512 frames shown]
[frame 43/512]
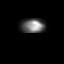
[frame 128/512]
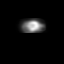
[frame 214/512]
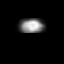
[frame 299/512]
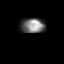
[frame 384/512]
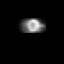
[frame 470/512]
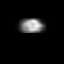

[Series 2: wbr stress-gsp · 6.40mm/px · 6 of 512 frames shown]
[frame 43/512]
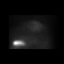
[frame 128/512]
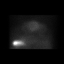
[frame 214/512]
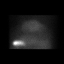
[frame 299/512]
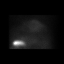
[frame 384/512]
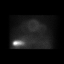
[frame 470/512]
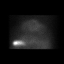

[Series 3: wbr stress-sum-em · 6.40mm/px · 6 of 64 frames shown]
[frame 6/64]
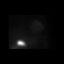
[frame 16/64]
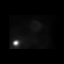
[frame 27/64]
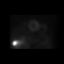
[frame 38/64]
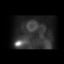
[frame 48/64]
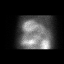
[frame 59/64]
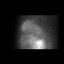

[Series 3: wbr_s-proj_st wbr stress-sum-em · 6.40mm/px · 6 of 64 frames shown]
[frame 6/64]
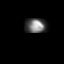
[frame 16/64]
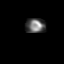
[frame 27/64]
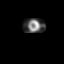
[frame 38/64]
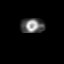
[frame 48/64]
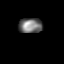
[frame 59/64]
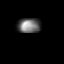

[36 of 36 positions shown; findings below may reference images not displayed]

Canned report from images found in remote index.

Refer to host system for actual result text.

## 2016-11-25 LAB — HM DIABETES EYE EXAM

## 2016-12-21 ENCOUNTER — Ambulatory Visit (HOSPITAL_COMMUNITY): Payer: Self-pay

## 2016-12-21 ENCOUNTER — Ambulatory Visit (INDEPENDENT_AMBULATORY_CARE_PROVIDER_SITE_OTHER): Payer: BLUE CROSS/BLUE SHIELD | Admitting: Family Medicine

## 2016-12-21 ENCOUNTER — Encounter: Payer: Self-pay | Admitting: Family Medicine

## 2016-12-21 ENCOUNTER — Ambulatory Visit (INDEPENDENT_AMBULATORY_CARE_PROVIDER_SITE_OTHER): Payer: BLUE CROSS/BLUE SHIELD

## 2016-12-21 VITALS — BP 135/78 | HR 70 | Temp 98.1°F | Resp 17 | Ht 73.5 in | Wt 198.0 lb

## 2016-12-21 DIAGNOSIS — N509 Disorder of male genital organs, unspecified: Secondary | ICD-10-CM

## 2016-12-21 DIAGNOSIS — R0789 Other chest pain: Secondary | ICD-10-CM | POA: Diagnosis not present

## 2016-12-21 DIAGNOSIS — E785 Hyperlipidemia, unspecified: Secondary | ICD-10-CM

## 2016-12-21 DIAGNOSIS — N50811 Right testicular pain: Secondary | ICD-10-CM

## 2016-12-21 DIAGNOSIS — R300 Dysuria: Secondary | ICD-10-CM

## 2016-12-21 DIAGNOSIS — N451 Epididymitis: Secondary | ICD-10-CM | POA: Diagnosis not present

## 2016-12-21 DIAGNOSIS — E119 Type 2 diabetes mellitus without complications: Secondary | ICD-10-CM

## 2016-12-21 DIAGNOSIS — K219 Gastro-esophageal reflux disease without esophagitis: Secondary | ICD-10-CM

## 2016-12-21 LAB — POCT URINALYSIS DIP (MANUAL ENTRY)
BILIRUBIN UA: NEGATIVE
Glucose, UA: NEGATIVE mg/dL
Ketones, POC UA: NEGATIVE mg/dL
LEUKOCYTES UA: NEGATIVE
NITRITE UA: NEGATIVE
PH UA: 7 (ref 5.0–8.0)
Protein Ur, POC: NEGATIVE mg/dL
RBC UA: NEGATIVE
Spec Grav, UA: 1.01 (ref 1.010–1.025)
UROBILINOGEN UA: 0.2 U/dL

## 2016-12-21 LAB — POC MICROSCOPIC URINALYSIS (UMFC): Mucus: ABSENT

## 2016-12-21 LAB — POCT CBC
Granulocyte percent: 58.1 %G (ref 37–80)
HCT, POC: 34.7 % — AB (ref 43.5–53.7)
HEMOGLOBIN: 11.8 g/dL — AB (ref 14.1–18.1)
LYMPH, POC: 1.5 (ref 0.6–3.4)
MCH, POC: 27.4 pg (ref 27–31.2)
MCHC: 34 g/dL (ref 31.8–35.4)
MCV: 80.5 fL (ref 80–97)
MID (cbc): 0.3 (ref 0–0.9)
MPV: 7.3 fL (ref 0–99.8)
PLATELET COUNT, POC: 262 10*3/uL (ref 142–424)
POC Granulocyte: 2.5 (ref 2–6.9)
POC LYMPH PERCENT: 34.6 %L (ref 10–50)
POC MID %: 7.3 % (ref 0–12)
RBC: 4.31 M/uL — AB (ref 4.69–6.13)
RDW, POC: 14.6 %
WBC: 4.3 10*3/uL — AB (ref 4.6–10.2)

## 2016-12-21 MED ORDER — PANTOPRAZOLE SODIUM 40 MG PO TBEC: 40.0000 mg | DELAYED_RELEASE_TABLET | Freq: Every day | ORAL | 1 refills | Status: DC

## 2016-12-21 MED ORDER — ATORVASTATIN CALCIUM 10 MG PO TABS: ORAL_TABLET | ORAL | 1 refills | Status: DC

## 2016-12-21 MED ORDER — CIPROFLOXACIN HCL 500 MG PO TABS: 500.0000 mg | ORAL_TABLET | Freq: Two times a day (BID) | ORAL | 0 refills | Status: DC

## 2016-12-21 NOTE — Patient Instructions (Addendum)
The area on the skin appears to be a blocked oil gland similar to a pimple. It does not appear infected at this time, but if it continues to be sore for the next week, or worsening, I can refer you to a urologist to look at that area further. I am treating you for a possible epididymitis or an infection behind the testicle. See information below on this condition. If your pain is not improving this upcoming week, I would recommend an ultrasound to make sure there are no other concerns with the testicle, or return to office if worsening.   See information below on chest wall pain. Tylenol over-the-counter as needed, but if pain is continuing next few weeks, we need to evaluate that area further.  No change in diabetes meds for now.    Please return in the next 2 weeks to discuss some of the concerns from today further including erectile dysfunction  Epididymitis Epididymitis is swelling (inflammation) of the epididymis. The epididymis is a cord-like structure that is located along the top and back part of the testicle. It collects and stores sperm from the testicle. This condition can also cause pain and swelling of the testicle and scrotum. Symptoms usually start suddenly (acute epididymitis). Sometimes epididymitis starts gradually and lasts for a while (chronic epididymitis). This type may be harder to treat. What are the causes? In men 84 and younger, this condition is usually caused by a bacterial infection or sexually transmitted disease (STD), such as:  Gonorrhea.  Chlamydia.  In men 8 and older who do not have anal sex, this condition is usually caused by bacteria from a blockage or abnormalities in the urinary system. These can result from:  Having a tube placed into the bladder (urinary catheter).  Having an enlarged or inflamed prostate gland.  Having recent urinary tract surgery.  In men who have a condition that weakens the body's defense system (immune system), such as HIV,  this condition can be caused by:  Other bacteria, including tuberculosis and syphilis.  Viruses.  Fungi.  Sometimes this condition occurs without infection. That may happen if urine flows backward into the epididymis after heavy lifting or straining. What increases the risk? This condition is more likely to develop in men:  Who have unprotected sex with more than one partner.  Who have anal sex.  Who have recently had surgery.  Who have a urinary catheter.  Who have urinary problems.  Who have a suppressed immune system.  What are the signs or symptoms? This condition usually begins suddenly with chills, fever, and pain behind the scrotum and in the testicle. Other symptoms include:  Swelling of the scrotum, testicle, or both.  Pain whenejaculatingor urinating.  Pain in the back or belly.  Nausea.  Itching and discharge from the penis.  Frequent need to pass urine.  Redness and tenderness of the scrotum.  How is this diagnosed? Your health care provider can diagnose this condition based on your symptoms and medical history. Your health care provider will also do a physical exam to ask about your symptoms and check your scrotum and testicle for swelling, pain, and redness. You may also have other tests, including:  Examination of discharge from the penis.  Urine tests for infections, such as STDs.  Your health care provider may test you for other STDs, including HIV. How is this treated? Treatment for this condition depends on the cause. If your condition is caused by a bacterial infection, oral antibiotic medicine may  be prescribed. If the bacterial infection has spread to your blood, you may need to receive IV antibiotics. Nonbacterial epididymitis is treated with home care that includes bed rest and elevation of the scrotum. Surgery may be needed to treat:  Bacterial epididymitis that causes pus to build up in the scrotum (abscess).  Chronic epididymitis  that has not responded to other treatments.  Follow these instructions at home: Medicines  Take over-the-counter and prescription medicines only as told by your health care provider.  If you were prescribed an antibiotic medicine, take it as told by your health care provider. Do not stop taking the antibiotic even if your condition improves. Sexual Activity  If your epididymitis was caused by an STD, avoid sexual activity until your treatment is complete.  Inform your sexual partner or partners if you test positive for an STD. They may need to be treated.Do not engage in sexual activity with your partner or partners until their treatment is completed. General instructions  Return to your normal activities as told by your health care provider. Ask your health care provider what activities are safe for you.  Keep your scrotum elevated and supported while resting. Ask your health care provider if you should wear a scrotal support, such as a jockstrap. Wear it as told by your health care provider.  If directed, apply ice to the affected area: ? Put ice in a plastic bag. ? Place a towel between your skin and the bag. ? Leave the ice on for 20 minutes, 2-3 times per day.  Try taking a sitz bath to help with discomfort. This is a warm water bath that is taken while you are sitting down. The water should only come up to your hips and should cover your buttocks. Do this 3-4 times per day or as told by your health care provider.  Keep all follow-up visits as told by your health care provider. This is important. Contact a health care provider if:  You have a fever.  Your pain medicine is not helping.  Your pain is getting worse.  Your symptoms do not improve within three days. This information is not intended to replace advice given to you by your health care provider. Make sure you discuss any questions you have with your health care provider. Document Released: 06/07/2000 Document  Revised: 11/16/2015 Document Reviewed: 10/26/2014 Elsevier Interactive Patient Education  2018 [employer].   Chest Wall Pain Chest wall pain is pain in or around the bones and muscles of your chest. Sometimes, an injury causes this pain. Sometimes, the cause may not be known. This pain may take several weeks or longer to get better. Follow these instructions at home: Pay attention to any changes in your symptoms. Take these actions to help with your pain:  Rest as told by your health care provider.  Avoid activities that cause pain. These include any activities that use your chest muscles or your abdominal and side muscles to lift heavy items.  If directed, apply ice to the painful area: ? Put ice in a plastic bag. ? Place a towel between your skin and the bag. ? Leave the ice on for 20 minutes, 2-3 times per day.  Take over-the-counter and prescription medicines only as told by your health care provider.  Do not use tobacco products, including cigarettes, chewing tobacco, and e-cigarettes. If you need help quitting, ask your health care provider.  Keep all follow-up visits as told by your health care provider. This is important.  Contact a health care provider if:  You have a fever.  Your chest pain becomes worse.  You have new symptoms. Get help right away if:  You have nausea or vomiting.  You feel sweaty or light-headed.  You have a cough with phlegm (sputum) or you cough up blood.  You develop shortness of breath. This information is not intended to replace advice given to you by your health care provider. Make sure you discuss any questions you have with your health care provider. Document Released: 06/10/2005 Document Revised: 10/19/2015 Document Reviewed: 09/05/2014 Elsevier Interactive Patient Education  2017 [employer].     IF you received an x-ray today, you will receive an invoice from [facility]. Please contact [facility] at  [phone] with questions or concerns regarding your invoice.   IF you received labwork today, you will receive an invoice from [place]. Please contact [facility] at [phone] with questions or concerns regarding your invoice.   Our billing staff will not be able to assist you with questions regarding bills from these companies.  You will be contacted with the lab results as soon as they are available. The fastest way to get your results is to activate your My Chart account. Instructions are located on the last page of this paperwork. If you have not heard from [place] regarding the results in 2 weeks, please contact this office.

## 2016-12-21 NOTE — Progress Notes (Addendum)
By signing my name below, I, Brendan Gonzales, attest that this documentation has been prepared under the direction and in the presence of Brendan Ray, MD.  Electronically Signed: Verlee Gonzales, Medical Scribe. 12/21/16. 9:25 AM.  Subjective:    Patient ID: Brendan Gonzales, male    DOB: 1955/07/19, 61 y.o.   MRN: [id number]  HPI Chief Complaint  Patient presents with  . Groin Pain    HPI Comments: Brendan Gonzales is a 61 y.o. male with a PMHx of DM and HTN who presents to Primary Care at [facility] complaining of right groin pain onset 2 weeks. He last saw me in Feb. Pt is fasting.  Groin Pain: He initially noticed a nonpainful bump on the right testicle 2 months ago, but pain int he concerned location occurred 2 weeks. Reports associated sxs of dysuria. Pt is married and isn't polygamous. Denies fever, N/V/D, bloody stool, emesis, hematuria, penile discharge, or new sexual partners.  Right Flank Pain: Intermittent Onset "about a year. He describes it as his muscle "bending".   DM: We discussed increasing metformin 1000 mg BID. He had been taking metformin 1000 mg in the morning, 500 mg at night. He is UTD on pneumovax.  Pt is compliant with metformin. Pt last saw his ophthalmologist 3 weeks ago. No diabetic changes found, but his rx has changed. Pt is not followed by a dentist. Lab Results  Component Value Date   HGBA1C 6.9 (H) 07/29/2016   Lab Results  Component Value Date   MICROALBUR 3.9 10/02/2015   Immunization History  Administered Date(s) Administered  . Influenza,inj,Quad PF,36+ Mos 05/31/2013, 03/07/2014  . Influenza-Unspecified 04/04/2015, 05/17/2016  . Pneumococcal Polysaccharide-23 06/13/2013  . Tdap 01/23/2015   Wt Readings from Last 3 Encounters:  12/21/16 198 lb (89.8 kg)  07/29/16 201 lb (91.2 kg)  07/27/16 209 lb (94.8 kg)  Body mass index is 25.77 kg/m.  Patient Active Problem List   Diagnosis Date Noted  . Eczema of hand 10/19/2014  . Type 2 diabetes  mellitus ([place]) 06/13/2013  . HTN (hypertension) 06/13/2013  . Normal coronary arteries- 2008 06/13/2013  . Chest pain with moderate risk of acute coronary syndrome 06/12/2013  . Special screening for malignant neoplasms, colon 06/27/2011  . Arthritis of knee 06/14/2011   Past Medical History:  Diagnosis Date  . Arthritis   . Constipation   . Diabetes mellitus    type 2  . Dizziness   . Headache(784.0)   . Hyperlipidemia   . Hypertension    benign   Past Surgical History:  Procedure Laterality Date  . COLONOSCOPY    . CORONARY ANGIOPLASTY    . HEMORRHOID SURGERY    . right femeroral bypass    . right knee surgery     No Known Allergies Prior to Admission medications   Medication Sig Start Date End Date Taking? Authorizing Provider  acetaminophen (TYLENOL) 500 MG tablet Take 500 mg by mouth 3 (three) times daily as needed for mild pain.    Yes [provider]  aspirin 81 MG tablet Take 81 mg by mouth every morning.    Yes [provider]  atorvastatin (LIPITOR) 10 MG tablet TAKE ONE TABLET BY MOUTH ONCE DAILY 07/29/16  Yes [name], MD  Calcium Carbonate Antacid (TUMS ULTRA 1000 PO) Take 1 tablet by mouth 3 (three) times daily as needed (indigestion).   Yes [provider]  clotrimazole-betamethasone (LOTRISONE) cream Apply 1 application topically 2 (two) times daily. 2 tubes of creams  15 g each tube 02/20/16  Yes Scot Jun, FNP  Cyanocobalamin (VITAMIN B 12 PO) Take 1,000 mg by mouth daily.   Yes [provider]  diclofenac sodium (VOLTAREN) 1 % GEL Apply 4 g topically 4 (four) times daily. To R knee 02/08/16  Yes [name], MD  Garlic 8416 MG CAPS Take by mouth daily.   Yes [provider]  lisinopril-hydrochlorothiazide (PRINZIDE,ZESTORETIC) 20-25 MG tablet TAKE ONE TABLET BY MOUTH ONCE DAILY 09/05/16  Yes Brendan, Chelle, PA-C  metFORMIN (GLUCOPHAGE) 500 MG tablet Take 2 tablets (1,000 mg total) by mouth 2  (two) times daily with a meal. 07/29/16  Yes [name], MD  methocarbamol (ROBAXIN) 500 MG tablet Take 1 tablet (500 mg total) by mouth 2 (two) times daily. 07/27/16  Yes Muthersbaugh, Jarrett Soho, PA-C  naproxen (NAPROSYN) 500 MG tablet Take 1 tablet (500 mg total) by mouth 2 (two) times daily with a meal. 07/27/16  Yes Muthersbaugh, Jarrett Soho, PA-C  oseltamivir (TAMIFLU) 75 MG capsule Take 1 capsule (75 mg total) by mouth every 12 (twelve) hours. 07/27/16  Yes Muthersbaugh, Jarrett Soho, PA-C  pantoprazole (PROTONIX) 40 MG tablet Take 1 tablet (40 mg total) by mouth daily. 10/02/15  Yes [name], MD  triamcinolone cream (KENALOG) 0.1 % Apply 1 application topically 2 (two) times daily. 10/17/14  Yes [name], MD  cetirizine (ZYRTEC) 10 MG tablet Take 10 mg by mouth daily as needed for allergies.     [provider]   Social History   Social History  . Marital status: Married    Spouse name: N/A  . Number of children: N/A  . Years of education: N/A   Occupational History  . Not on file.   Social History Main Topics  . Smoking status: Former Research scientist (life sciences)  . Smokeless tobacco: Never Used  . Alcohol use No  . Drug use: No  . Sexual activity: No   Other Topics Concern  . Not on file   Social History Narrative  . No narrative on file   Review of Systems  Constitutional: Negative for fever.  Gastrointestinal: Negative for blood in stool, diarrhea, nausea and vomiting.  Genitourinary: Positive for dysuria, flank pain, genital sores and testicular pain. Negative for difficulty urinating, discharge and hematuria.   Objective:  Physical Exam  Constitutional: He appears well-developed and well-nourished. No distress.  HENT:  Head: Normocephalic and atraumatic.  Eyes: Conjunctivae are normal.  Neck: Neck supple.  Cardiovascular: Normal rate.   Pulmonary/Chest: Effort normal.  Abdominal: There is no rebound and no guarding.  Very minimal tenderness over mid-abdomen but not on  lower abdomen  Genitourinary:  Genitourinary Comments: Tenderness behind the R testicle at the epididymis He also complains of discomfort over a small approx 3 mm cystic structure on scrotal skin without induration, discharge or surrounding edema/erythema  Neurological: He is alert.  Skin: Skin is warm and dry.  Psychiatric: He has a normal mood and affect. His behavior is normal.  Nursing note and vitals reviewed.   Vitals:   12/21/16 0855  BP: 135/78  Pulse: 70  Resp: 17  Temp: 98.1 F (36.7 C)  TempSrc: Oral  SpO2: 98%  Weight: 198 lb (89.8 kg)  Height: 6' 1.5" (1.867 m)   Body mass index is 25.77 kg/m.   Results for orders placed or performed in visit on 12/21/16  POCT urinalysis dipstick  Result Value Ref Range   Color, UA yellow yellow   Clarity, UA clear clear  Glucose, UA negative negative mg/dL   Bilirubin, UA negative negative   Ketones, POC UA negative negative mg/dL   Spec Grav, UA 1.010 1.010 - 1.025   Blood, UA negative negative   pH, UA 7.0 5.0 - 8.0   Protein Ur, POC negative negative mg/dL   Urobilinogen, UA 0.2 0.2 or 1.0 E.U./dL   Nitrite, UA Negative Negative   Leukocytes, UA Negative Negative  POCT Microscopic Urinalysis ([facility])  Result Value Ref Range   WBC,UR,HPF,POC None None WBC/hpf   RBC,UR,HPF,POC None None RBC/hpf   Bacteria None None, Too numerous to count   Mucus Absent Absent   Epithelial Cells, UR Per Microscopy None None, Too numerous to count cells/hpf  POCT CBC  Result Value Ref Range   WBC 4.3 (A) 4.6 - 10.2 K/uL   Lymph, poc 1.5 0.6 - 3.4   POC LYMPH PERCENT 34.6 10 - 50 %L   MID (cbc) 0.3 0 - 0.9   POC MID % 7.3 0 - 12 %M   POC Granulocyte 2.5 2 - 6.9   Granulocyte percent 58.1 37 - 80 %G   RBC 4.31 (A) 4.69 - 6.13 M/uL   Hemoglobin 11.8 (A) 14.1 - 18.1 g/dL   HCT, POC 34.7 (A) 43.5 - 53.7 %   MCV 80.5 80 - 97 fL   MCH, POC 27.4 27 - 31.2 pg   MCHC 34.0 31.8 - 35.4 g/dL   RDW, POC 14.6 %   Platelet Count, POC 262 142 -  424 K/uL   MPV 7.3 0 - 99.8 fL   Assessment & Plan:  At the end of the visit he would also like to discuss erectile dysfunction. Plans to return in a few weeks and will discuss further at that time.   Euclide Granito is a 61 y.o. male Right testicular pain - Plan: ciprofloxacin (CIPRO) 500 MG tablet Dysuria - Plan: POCT urinalysis dipstick, POCT Microscopic Urinalysis ([facility]), POCT CBC Epididymitis - Plan: ciprofloxacin (CIPRO) 500 MG tablet  - Possible epididymitis/orchitis no known STI risk factors. Start Cipro 500 mg twice a day, tendinopathy risks discussed.  -RTC precautions if not improving or worsening next week: would consider scrotal ultrasound/Doppler if not improving next week, but advised to follow-up if worsening.  Type 2 diabetes mellitus without complication, without long-term current use of insulin (HCC) - Plan: Hemoglobin A1c, Microalbumin, urine  - Check A1c, continuing 1000 mg metformin the morning, 500 mg at bedtime for now. Check urine microalbumin  Right-sided chest wall pain - Plan: DG Ribs Unilateral W/Chest Right, CANCELED: DG Ribs Unilateral W/Chest Right  - Long-standing, episodic, may be muscular. Handout given, x-Gonzales with possible nondisplaced eighth rib fracture, but only seen on one view, and denies recent injury.. If pain persists, option of CT scanning to further evaluate   -Tylenol over-the-counter, RTC precautions if worsening or persists.  Lesion of scrotum  - Appears to be small comedone/cyst. No surrounding erythema or induration, unlikely infectious. Warm compresses to area were discussed, but if increased swelling or pain, consider urology eval  Hyperlipidemia, unspecified hyperlipidemia type - Plan: atorvastatin (LIPITOR) 10 MG tablet  - Refilled Lipitor, previous lipids okay. Plan for repeat lipid panel next few months  Gastroesophageal reflux disease, esophagitis presence not specified - Plan: pantoprazole (PROTONIX) 40 MG tablet  - Protonix  refilled, can discuss further next visit.  Meds ordered this encounter  Medications  . ciprofloxacin (CIPRO) 500 MG tablet    Sig: Take 1 tablet (500 mg total) by mouth  2 (two) times daily.    Dispense:  20 tablet    Refill:  0  . atorvastatin (LIPITOR) 10 MG tablet    Sig: TAKE ONE TABLET BY MOUTH ONCE DAILY    Dispense:  90 tablet    Refill:  1  . pantoprazole (PROTONIX) 40 MG tablet    Sig: Take 1 tablet (40 mg total) by mouth daily.    Dispense:  90 tablet    Refill:  1   Patient Instructions   The area on the skin appears to be a blocked oil gland similar to a pimple. It does not appear infected at this time, but if it continues to be sore for the next week, or worsening, I can refer you to a urologist to look at that area further. I am treating you for a possible epididymitis or an infection behind the testicle. See information below on this condition. If your pain is not improving this upcoming week, I would recommend an ultrasound to make sure there are no other concerns with the testicle, or return to office if worsening.   See information below on chest wall pain. Tylenol over-the-counter as needed, but if pain is continuing next few weeks, we need to evaluate that area further.  No change in diabetes meds for now.    Please return in the next 2 weeks to discuss some of the concerns from today further including erectile dysfunction  Epididymitis Epididymitis is swelling (inflammation) of the epididymis. The epididymis is a cord-like structure that is located along the top and back part of the testicle. It collects and stores sperm from the testicle. This condition can also cause pain and swelling of the testicle and scrotum. Symptoms usually start suddenly (acute epididymitis). Sometimes epididymitis starts gradually and lasts for a while (chronic epididymitis). This type may be harder to treat. What are the causes? In men 74 and younger, this condition is usually caused by  a bacterial infection or sexually transmitted disease (STD), such as:  Gonorrhea.  Chlamydia.  In men 110 and older who do not have anal sex, this condition is usually caused by bacteria from a blockage or abnormalities in the urinary system. These can result from:  Having a tube placed into the bladder (urinary catheter).  Having an enlarged or inflamed prostate gland.  Having recent urinary tract surgery.  In men who have a condition that weakens the body's defense system (immune system), such as HIV, this condition can be caused by:  Other bacteria, including tuberculosis and syphilis.  Viruses.  Fungi.  Sometimes this condition occurs without infection. That may happen if urine flows backward into the epididymis after heavy lifting or straining. What increases the risk? This condition is more likely to develop in men:  Who have unprotected sex with more than one partner.  Who have anal sex.  Who have recently had surgery.  Who have a urinary catheter.  Who have urinary problems.  Who have a suppressed immune system.  What are the signs or symptoms? This condition usually begins suddenly with chills, fever, and pain behind the scrotum and in the testicle. Other symptoms include:  Swelling of the scrotum, testicle, or both.  Pain whenejaculatingor urinating.  Pain in the back or belly.  Nausea.  Itching and discharge from the penis.  Frequent need to pass urine.  Redness and tenderness of the scrotum.  How is this diagnosed? Your health care provider can diagnose this condition based on your symptoms and medical  history. Your health care provider will also do a physical exam to ask about your symptoms and check your scrotum and testicle for swelling, pain, and redness. You may also have other tests, including:  Examination of discharge from the penis.  Urine tests for infections, such as STDs.  Your health care provider may test you for other STDs,  including HIV. How is this treated? Treatment for this condition depends on the cause. If your condition is caused by a bacterial infection, oral antibiotic medicine may be prescribed. If the bacterial infection has spread to your blood, you may need to receive IV antibiotics. Nonbacterial epididymitis is treated with home care that includes bed rest and elevation of the scrotum. Surgery may be needed to treat:  Bacterial epididymitis that causes pus to build up in the scrotum (abscess).  Chronic epididymitis that has not responded to other treatments.  Follow these instructions at home: Medicines  Take over-the-counter and prescription medicines only as told by your health care provider.  If you were prescribed an antibiotic medicine, take it as told by your health care provider. Do not stop taking the antibiotic even if your condition improves. Sexual Activity  If your epididymitis was caused by an STD, avoid sexual activity until your treatment is complete.  Inform your sexual partner or partners if you test positive for an STD. They may need to be treated.Do not engage in sexual activity with your partner or partners until their treatment is completed. General instructions  Return to your normal activities as told by your health care provider. Ask your health care provider what activities are safe for you.  Keep your scrotum elevated and supported while resting. Ask your health care provider if you should wear a scrotal support, such as a jockstrap. Wear it as told by your health care provider.  If directed, apply ice to the affected area: ? Put ice in a plastic bag. ? Place a towel between your skin and the bag. ? Leave the ice on for 20 minutes, 2-3 times per day.  Try taking a sitz bath to help with discomfort. This is a warm water bath that is taken while you are sitting down. The water should only come up to your hips and should cover your buttocks. Do this 3-4 times per day  or as told by your health care provider.  Keep all follow-up visits as told by your health care provider. This is important. Contact a health care provider if:  You have a fever.  Your pain medicine is not helping.  Your pain is getting worse.  Your symptoms do not improve within three days. This information is not intended to replace advice given to you by your health care provider. Make sure you discuss any questions you have with your health care provider. Document Released: 06/07/2000 Document Revised: 11/16/2015 Document Reviewed: 10/26/2014 Elsevier Interactive Patient Education  2018 [employer].   Chest Wall Pain Chest wall pain is pain in or around the bones and muscles of your chest. Sometimes, an injury causes this pain. Sometimes, the cause may not be known. This pain may take several weeks or longer to get better. Follow these instructions at home: Pay attention to any changes in your symptoms. Take these actions to help with your pain:  Rest as told by your health care provider.  Avoid activities that cause pain. These include any activities that use your chest muscles or your abdominal and side muscles to lift heavy items.  If  directed, apply ice to the painful area: ? Put ice in a plastic bag. ? Place a towel between your skin and the bag. ? Leave the ice on for 20 minutes, 2-3 times per day.  Take over-the-counter and prescription medicines only as told by your health care provider.  Do not use tobacco products, including cigarettes, chewing tobacco, and e-cigarettes. If you need help quitting, ask your health care provider.  Keep all follow-up visits as told by your health care provider. This is important.  Contact a health care provider if:  You have a fever.  Your chest pain becomes worse.  You have new symptoms. Get help right away if:  You have nausea or vomiting.  You feel sweaty or light-headed.  You have a cough with phlegm (sputum) or you  cough up blood.  You develop shortness of breath. This information is not intended to replace advice given to you by your health care provider. Make sure you discuss any questions you have with your health care provider. Document Released: 06/10/2005 Document Revised: 10/19/2015 Document Reviewed: 09/05/2014 Elsevier Interactive Patient Education  2017 [employer].     IF you received an x-Gonzales today, you will receive an invoice from [facility]. Please contact [facility] at [phone] with questions or concerns regarding your invoice.   IF you received labwork today, you will receive an invoice from [facility]. Please contact [facility] at [phone] with questions or concerns regarding your invoice.   Our billing staff will not be able to assist you with questions regarding bills from these companies.  You will be contacted with the lab results as soon as they are available. The fastest way to get your results is to activate your My Chart account. Instructions are located on the last page of this paperwork. If you have not heard from [place] regarding the results in 2 weeks, please contact this office.       I personally performed the services described in this documentation, which was scribed in my presence. The recorded information has been reviewed and considered for accuracy and completeness, addended by me as needed, and agree with information above.  Signed,   Brendan Ray, MD [facility].  12/21/16 10:42 AM

## 2016-12-22 ENCOUNTER — Encounter: Payer: Self-pay | Admitting: Family Medicine

## 2016-12-22 LAB — HEMOGLOBIN A1C
ESTIMATED AVERAGE GLUCOSE: 148 mg/dL
HEMOGLOBIN A1C: 6.8 % — AB (ref 4.8–5.6)

## 2016-12-22 LAB — MICROALBUMIN, URINE: MICROALBUM., U, RANDOM: 7.3 ug/mL

## 2016-12-24 ENCOUNTER — Ambulatory Visit: Payer: BLUE CROSS/BLUE SHIELD | Admitting: Emergency Medicine

## 2016-12-30 ENCOUNTER — Other Ambulatory Visit: Payer: Self-pay | Admitting: Family Medicine

## 2016-12-30 ENCOUNTER — Encounter: Payer: Self-pay | Admitting: Family Medicine

## 2016-12-30 ENCOUNTER — Ambulatory Visit (INDEPENDENT_AMBULATORY_CARE_PROVIDER_SITE_OTHER): Payer: BLUE CROSS/BLUE SHIELD | Admitting: Family Medicine

## 2016-12-30 ENCOUNTER — Ambulatory Visit
Admission: RE | Admit: 2016-12-30 | Discharge: 2016-12-30 | Disposition: A | Discharge status: Home / Self Care | Payer: BLUE CROSS/BLUE SHIELD | Source: Ambulatory Visit | Attending: Family Medicine | Admitting: Family Medicine

## 2016-12-30 VITALS — BP 130/88 | HR 58 | Temp 97.9°F | Resp 16 | Ht 73.5 in | Wt 199.0 lb

## 2016-12-30 DIAGNOSIS — R109 Unspecified abdominal pain: Secondary | ICD-10-CM

## 2016-12-30 DIAGNOSIS — R102 Pelvic and perineal pain: Secondary | ICD-10-CM

## 2016-12-30 DIAGNOSIS — N503 Cyst of epididymis: Secondary | ICD-10-CM

## 2016-12-30 DIAGNOSIS — R319 Hematuria, unspecified: Secondary | ICD-10-CM

## 2016-12-30 DIAGNOSIS — N50811 Right testicular pain: Secondary | ICD-10-CM

## 2016-12-30 DIAGNOSIS — R9341 Abnormal radiologic findings on diagnostic imaging of renal pelvis, ureter, or bladder: Secondary | ICD-10-CM

## 2016-12-30 LAB — POC MICROSCOPIC URINALYSIS (UMFC): MUCUS RE: ABSENT

## 2016-12-30 LAB — POCT URINALYSIS DIPSTICK
BILIRUBIN UA: NEGATIVE
Glucose, UA: NEGATIVE
KETONES UA: NEGATIVE
LEUKOCYTES UA: NEGATIVE
Nitrite, UA: NEGATIVE
PH UA: 5.5 (ref 5.0–8.0)
Protein, UA: NEGATIVE
SPEC GRAV UA: 1.01 (ref 1.010–1.025)
Urobilinogen, UA: 0.2 E.U./dL

## 2016-12-30 LAB — POCT CBC
GRANULOCYTE PERCENT: 57.1 % (ref 37–80)
HEMATOCRIT: 33.5 % — AB (ref 43.5–53.7)
HEMOGLOBIN: 11.2 g/dL — AB (ref 14.1–18.1)
Lymph, poc: 1.7 (ref 0.6–3.4)
MCH, POC: 26.9 pg — AB (ref 27–31.2)
MCHC: 33.6 g/dL (ref 31.8–35.4)
MCV: 80.3 fL (ref 80–97)
MID (cbc): 0.3 (ref 0–0.9)
MPV: 7.1 fL (ref 0–99.8)
POC GRANULOCYTE: 2.6 (ref 2–6.9)
POC LYMPH PERCENT: 37 %L (ref 10–50)
POC MID %: 5.9 % (ref 0–12)
Platelet Count, POC: 247 10*3/uL (ref 142–424)
RBC: 4.18 M/uL — AB (ref 4.69–6.13)
RDW, POC: 15.3 %
WBC: 4.6 10*3/uL (ref 4.6–10.2)

## 2016-12-30 MED ORDER — DOXYCYCLINE HYCLATE 100 MG PO TABS: 100.0000 mg | ORAL_TABLET | Freq: Two times a day (BID) | ORAL | 0 refills | Status: DC

## 2016-12-30 NOTE — Progress Notes (Signed)
Subjective:  By signing my name below, I, [name], attest that this documentation has been prepared under the direction and in the presence of [name], MD Electronically Signed: Ladene Artist, ED Scribe 12/30/2016 at 9:30 AM.   Patient ID: Brendan Gonzales, male    DOB: [birth year], 61 y.o.   MRN: [id number]  Chief Complaint  Patient presents with  . Follow-up    per patient, Cipro causing kidney pain, stopped taking   HPI Brendan Gonzales is a 61 y.o. male who presents to Primary Care at [facility] for a follow-up. Last seen 6/30 with multiple concerns. Was complaining of R testicle pain over the prior 2 weeks with non-painful bump 2 months prior, intermittent R flank pain over the past year that felt like bending muscle. Did have tenderness over the epidiymis. Treated with Cipro 500 mg bid for possible epididymitis. On 7/1, email sent with "pain on both side of kidney" and stopped Cipro.   Today, pt states that he stopped Cipro after the second dose due to a "pain in his kidneys" onset 7/1 which resolved 7/2. He reports associated abdominal pain with palpation, mild dysuria and continued testicular pain. Pt denies difficulty urinating, hematuria, urinary frequency, fever, nausea, vomiting. Has had diarrhea and constipation in the past with milk, nothing new.  Patient Active Problem List   Diagnosis Date Noted  . Eczema of hand 10/19/2014  . Type 2 diabetes mellitus ([place]) 06/13/2013  . HTN (hypertension) 06/13/2013  . Normal coronary arteries- 2008 06/13/2013  . Chest pain with moderate risk of acute coronary syndrome 06/12/2013  . Special screening for malignant neoplasms, colon 06/27/2011  . Arthritis of knee 06/14/2011   Past Medical History:  Diagnosis Date  . Arthritis   . Constipation   . Diabetes mellitus    type 2  . Dizziness   . Headache(784.0)   . Hyperlipidemia   . Hypertension    benign   Past Surgical History:  Procedure Laterality Date  . COLONOSCOPY    .  CORONARY ANGIOPLASTY    . HEMORRHOID SURGERY    . right femeroral bypass    . right knee surgery     No Known Allergies Prior to Admission medications   Medication Sig Start Date End Date Taking? Authorizing Provider  acetaminophen (TYLENOL) 500 MG tablet Take 500 mg by mouth 3 (three) times daily as needed for mild pain.     [provider]  aspirin 81 MG tablet Take 81 mg by mouth every morning.     [provider]  atorvastatin (LIPITOR) 10 MG tablet TAKE ONE TABLET BY MOUTH ONCE DAILY 12/21/16   [name], MD  Calcium Carbonate Antacid (TUMS ULTRA 1000 PO) Take 1 tablet by mouth 3 (three) times daily as needed (indigestion).    [provider]  cetirizine (ZYRTEC) 10 MG tablet Take 10 mg by mouth daily as needed for allergies.     [provider]  ciprofloxacin (CIPRO) 500 MG tablet Take 1 tablet (500 mg total) by mouth 2 (two) times daily. 12/21/16   [name], MD  clotrimazole-betamethasone (LOTRISONE) cream Apply 1 application topically 2 (two) times daily. 2 tubes of creams 15 g each tube 02/20/16   Scot Jun, FNP  Cyanocobalamin (VITAMIN B 12 PO) Take 1,000 mg by mouth daily.    [provider]  diclofenac sodium (VOLTAREN) 1 % GEL Apply 4 g topically 4 (four) times daily. To R knee 02/08/16   [name]  R, MD  Garlic 0960 MG CAPS Take by mouth daily.    [provider]  lisinopril-hydrochlorothiazide (PRINZIDE,ZESTORETIC) 20-25 MG tablet TAKE ONE TABLET BY MOUTH ONCE DAILY 09/05/16   Harrison Mons, PA-C  metFORMIN (GLUCOPHAGE) 500 MG tablet Take 2 tablets (1,000 mg total) by mouth 2 (two) times daily with a meal. 07/29/16   [name], MD  methocarbamol (ROBAXIN) 500 MG tablet Take 1 tablet (500 mg total) by mouth 2 (two) times daily. 07/27/16   Muthersbaugh, Jarrett Soho, PA-C  naproxen (NAPROSYN) 500 MG tablet Take 1 tablet (500 mg total) by mouth 2 (two) times daily with a meal. 07/27/16   Muthersbaugh,  Jarrett Soho, PA-C  oseltamivir (TAMIFLU) 75 MG capsule Take 1 capsule (75 mg total) by mouth every 12 (twelve) hours. 07/27/16   Muthersbaugh, Jarrett Soho, PA-C  pantoprazole (PROTONIX) 40 MG tablet Take 1 tablet (40 mg total) by mouth daily. 12/21/16   [name], MD  triamcinolone cream (KENALOG) 0.1 % Apply 1 application topically 2 (two) times daily. 10/17/14   [name], MD   Social History   Social History  . Marital status: Married    Spouse name: N/A  . Number of children: N/A  . Years of education: N/A   Occupational History  . Not on file.   Social History Main Topics  . Smoking status: Former Research scientist (life sciences)  . Smokeless tobacco: Never Used  . Alcohol use No  . Drug use: No  . Sexual activity: No   Other Topics Concern  . Not on file   Social History Narrative  . No narrative on file   Review of Systems  Constitutional: Negative for fever.  Gastrointestinal: Positive for abdominal pain. Negative for constipation, diarrhea, nausea and vomiting.  Genitourinary: Positive for dysuria (mild), flank pain and testicular pain. Negative for difficulty urinating, frequency and hematuria.      Objective:   Physical Exam  Constitutional: He is oriented to person, place, and time. He appears well-developed and well-nourished. No distress.  HENT:  Head: Normocephalic and atraumatic.  Eyes: Conjunctivae and EOM are normal.  Neck: Neck supple. No tracheal deviation present.  Cardiovascular: Normal rate.   Pulmonary/Chest: Effort normal. No respiratory distress.  Abdominal: There is tenderness in the suprapubic area.  Tender in the suprapubic and umbilicus but no hernia seen at the umbilicus.  Genitourinary:  Genitourinary Comments: Tender behind the R testicle and epididymidis. No hernia palpated.  Musculoskeletal: Normal range of motion.  No midline upper back pain. No CVA tenderness. Dose describe previous area of pain at the CVA.  Neurological: He is alert and oriented to  person, place, and time.  Skin: Skin is warm and dry.  Psychiatric: He has a normal mood and affect. His behavior is normal.  Nursing note and vitals reviewed.  Vitals:   12/30/16 0852  BP: 130/88  Pulse: (!) 58  Resp: 16  Temp: 97.9 F (36.6 C)  TempSrc: Oral  SpO2: 99%  Weight: 199 lb (90.3 kg)  Height: 6' 1.5" (1.867 m)   Results for orders placed or performed in visit on 12/30/16  POCT CBC  Result Value Ref Range   WBC 4.6 4.6 - 10.2 K/uL   Lymph, poc 1.7 0.6 - 3.4   POC LYMPH PERCENT 37.0 10 - 50 %L   MID (cbc) 0.3 0 - 0.9   POC MID % 5.9 0 - 12 %M   POC Granulocyte 2.6 2 - 6.9   Granulocyte percent 57.1 37 - 80 %  G   RBC 4.18 (A) 4.69 - 6.13 M/uL   Hemoglobin 11.2 (A) 14.1 - 18.1 g/dL   HCT, POC 33.5 (A) 43.5 - 53.7 %   MCV 80.3 80 - 97 fL   MCH, POC 26.9 (A) 27 - 31.2 pg   MCHC 33.6 31.8 - 35.4 g/dL   RDW, POC 15.3 %   Platelet Count, POC 247 142 - 424 K/uL   MPV 7.1 0 - 99.8 fL  POCT urinalysis dipstick  Result Value Ref Range   Color, UA yellow    Clarity, UA clear    Glucose, UA negative    Bilirubin, UA negative    Ketones, UA negative    Spec Grav, UA 1.010 1.010 - 1.025   Blood, UA trace-intact    pH, UA 5.5 5.0 - 8.0   Protein, UA negative    Urobilinogen, UA 0.2 0.2 or 1.0 E.U./dL   Nitrite, UA negative    Leukocytes, UA Negative Negative  POCT Microscopic Urinalysis ([facility])  Result Value Ref Range   WBC,UR,HPF,POC None None WBC/hpf   RBC,UR,HPF,POC None None RBC/hpf   Bacteria None None, Too numerous to count   Mucus Absent Absent   Epithelial Cells, UR Per Microscopy Few (A) None, Too numerous to count cells/hpf  US Scrotum  Result Date: 12/30/2016 CLINICAL DATA:  Right testicular pain. EXAM: SCROTAL ULTRASOUND DOPPLER ULTRASOUND OF THE TESTICLES TECHNIQUE: Complete ultrasound examination of the testicles, epididymis, and other scrotal structures was performed. Color and spectral Doppler ultrasound were also utilized to evaluate blood flow to  the testicles. COMPARISON:  None. FINDINGS: Right testicle Measurements: 4.7 x 3.4 x 2.0 cm. No mass or microlithiasis visualized. Left testicle Measurements: 3.5 x 3.0 x 1.4 cm. No mass or microlithiasis visualized. Right epididymis:  4 mm cyst is noted. Left epididymis:  Normal in size and appearance. Hydrocele:  None visualized. Varicocele:  None visualized. Pulsed Doppler interrogation of both testes demonstrates normal low resistance arterial and venous waveforms bilaterally. IMPRESSION: Small right epididymal cyst. No evidence of testicular mass or torsion. Electronically Signed   By: Marijo Conception, M.D.   On: 12/30/2016 12:02   [place] Art/ven Flow Abd Pelv Doppler  Result Date: 12/30/2016 CLINICAL DATA:  Right testicular pain. EXAM: SCROTAL ULTRASOUND DOPPLER ULTRASOUND OF THE TESTICLES TECHNIQUE: Complete ultrasound examination of the testicles, epididymis, and other scrotal structures was performed. Color and spectral Doppler ultrasound were also utilized to evaluate blood flow to the testicles. COMPARISON:  None. FINDINGS: Right testicle Measurements: 4.7 x 3.4 x 2.0 cm. No mass or microlithiasis visualized. Left testicle Measurements: 3.5 x 3.0 x 1.4 cm. No mass or microlithiasis visualized. Right epididymis:  4 mm cyst is noted. Left epididymis:  Normal in size and appearance. Hydrocele:  None visualized. Varicocele:  None visualized. Pulsed Doppler interrogation of both testes demonstrates normal low resistance arterial and venous waveforms bilaterally. IMPRESSION: Small right epididymal cyst. No evidence of testicular mass or torsion. Electronically Signed   By: Marijo Conception, M.D.   On: 12/30/2016 12:02   Ct Renal Stone Study  Result Date: 12/30/2016 CLINICAL DATA:  Bilateral flank pain.  Hematuria. EXAM: CT ABDOMEN AND PELVIS WITHOUT CONTRAST TECHNIQUE: Multidetector CT imaging of the abdomen and pelvis was performed following the standard protocol without IV contrast. COMPARISON:  None.  FINDINGS: Lower chest: No acute abnormality Hepatobiliary: No focal hepatic abnormality. Gallbladder unremarkable. Pancreas: No focal abnormality or ductal dilatation. Spleen: No focal abnormality.  Normal size. Adrenals/Urinary Tract: No adrenal abnormality.  No focal renal abnormality. No stones or hydronephrosis. Urinary bladder appears slightly thickened but is decompressed and difficult to evaluate. Stomach/Bowel: Stomach, large and small bowel grossly unremarkable. Normal appendix Vascular/Lymphatic: No evidence of aneurysm or adenopathy. Reproductive: Mild prostate enlargement. Other: No free fluid or free air. Musculoskeletal: No acute bony abnormality. IMPRESSION: No renal or ureteral stones.  No hydronephrosis. Bladder wall appears mildly thickened but difficult to evaluate due to decompressed state. This could be related to decompression, cystitis, or bladder outlet obstruction. The prostate is mildly prominent. Electronically Signed   By: Rolm Baptise M.D.   On: 12/30/2016 11:55       Assessment & Plan:  Brendan Gonzales is a 61 y.o. male Acute flank pain - Plan: POCT CBC, POCT urinalysis dipstick, Urine Culture, POCT Microscopic Urinalysis ([facility]), CT RENAL STONE STUDY  Hematuria, unspecified type - Plan: POCT Microscopic Urinalysis ([facility]), PSA, CT RENAL STONE STUDY  Right testicular pain - Plan: US Scrotum, [place] Art/Ven Flow Abd Pelv Doppler  Suprapubic pain - Plan: CT RENAL STONE STUDY  Persistent slight dysuria, right testicular pain, now with suprapubic abdominal pain. Ultrasound obtained indicating epididymal cyst, but not acute epididymitis by ultrasound. No sign of kidney stone on CT urogram   -With thickened bladder wall, cystitis possible, or urethritis based on his symptoms. He did not tolerate Cipro (but unlikely flank pain due to the antibiotic)He declined Cipro restart.   - doxycycline 100 mg twice a day, check urine culture, refer to urology for evaluation of cyst and abnormal  bladder on CT scan. RTC precautions if worsening.  No orders of the defined types were placed in this encounter.  Patient Instructions   Please report to [facility] by 10:30 am for Ultrasound. CT scan will also be at [place] at 12:20 pm.    We will check ultrasound for infection of the testicle or epididymis, as well as a CT scan to look for kidney stones based on your symptoms. Depending on those results, could call in different antibiotic if needed, or may refer you to urology. Please follow-up in the next few weeks to discuss erectile dysfunction further as well as other concerns at that time. Sooner if worse.    IF you received an x-ray today, you will receive an invoice from [facility]. Please contact [facility] at [phone] with questions or concerns regarding your invoice.   IF you received labwork today, you will receive an invoice from [facility]. Please contact [facility] at [phone] with questions or concerns regarding your invoice.   Our billing staff will not be able to assist you with questions regarding bills from these companies.  You will be contacted with the lab results as soon as they are available. The fastest way to get your results is to activate your My Chart account. Instructions are located on the last page of this paperwork. If you have not heard from [place] regarding the results in 2 weeks, please contact this office.      I personally performed the services described in this documentation, which was scribed in my presence. The recorded information has been reviewed and considered for accuracy and completeness, addended by me as needed, and agree with information above.  Signed,   [name], MD Primary [facility] at [place].  12/30/16 1:38 PM

## 2016-12-30 NOTE — Patient Instructions (Addendum)
Please report to [facility] by 10:30 am for Ultrasound. CT scan will also be at [place] at 12:20 pm.    We will check ultrasound for infection of the testicle or epididymis, as well as a CT scan to look for kidney stones based on your symptoms. Depending on those results, could call in different antibiotic if needed, or may refer you to urology. Please follow-up in the next few weeks to discuss erectile dysfunction further as well as other concerns at that time. Sooner if worse.    IF you received an x-ray today, you will receive an invoice from [facility]. Please contact [facility] at [phone] with questions or concerns regarding your invoice.   IF you received labwork today, you will receive an invoice from [facility]. Please contact [facility] at [phone] with questions or concerns regarding your invoice.   Our billing staff will not be able to assist you with questions regarding bills from these companies.  You will be contacted with the lab results as soon as they are available. The fastest way to get your results is to activate your My Chart account. Instructions are located on the last page of this paperwork. If you have not heard from [place] regarding the results in 2 weeks, please contact this office.

## 2016-12-31 LAB — PSA: Prostate Specific Ag, Serum: 1.4 ng/mL (ref 0.0–4.0)

## 2016-12-31 LAB — URINE CULTURE

## 2017-04-08 ENCOUNTER — Other Ambulatory Visit: Payer: Self-pay | Admitting: Family Medicine

## 2017-04-08 DIAGNOSIS — IMO0001 Reserved for inherently not codable concepts without codable children: Secondary | ICD-10-CM

## 2017-04-08 DIAGNOSIS — E1165 Type 2 diabetes mellitus with hyperglycemia: Principal | ICD-10-CM

## 2017-04-10 ENCOUNTER — Other Ambulatory Visit: Payer: Self-pay | Admitting: Family Medicine

## 2017-04-10 DIAGNOSIS — E1165 Type 2 diabetes mellitus with hyperglycemia: Principal | ICD-10-CM

## 2017-04-10 DIAGNOSIS — IMO0001 Reserved for inherently not codable concepts without codable children: Secondary | ICD-10-CM

## 2017-04-18 ENCOUNTER — Other Ambulatory Visit: Payer: Self-pay | Admitting: Family Medicine

## 2017-04-18 DIAGNOSIS — IMO0001 Reserved for inherently not codable concepts without codable children: Secondary | ICD-10-CM

## 2017-04-18 DIAGNOSIS — E1165 Type 2 diabetes mellitus with hyperglycemia: Principal | ICD-10-CM

## 2017-04-26 ENCOUNTER — Emergency Department (HOSPITAL_COMMUNITY): Payer: BLUE CROSS/BLUE SHIELD

## 2017-04-26 ENCOUNTER — Emergency Department (HOSPITAL_COMMUNITY)
Admission: EM | Admit: 2017-04-26 | Discharge: 2017-04-26 | Disposition: A | Discharge status: Home / Self Care | Payer: BLUE CROSS/BLUE SHIELD | Attending: Emergency Medicine | Admitting: Emergency Medicine

## 2017-04-26 ENCOUNTER — Encounter (HOSPITAL_COMMUNITY): Payer: Self-pay | Admitting: Emergency Medicine

## 2017-04-26 DIAGNOSIS — Y9389 Activity, other specified: Secondary | ICD-10-CM | POA: Diagnosis not present

## 2017-04-26 DIAGNOSIS — S301XXA Contusion of abdominal wall, initial encounter: Secondary | ICD-10-CM | POA: Insufficient documentation

## 2017-04-26 DIAGNOSIS — R079 Chest pain, unspecified: Secondary | ICD-10-CM | POA: Insufficient documentation

## 2017-04-26 DIAGNOSIS — I10 Essential (primary) hypertension: Secondary | ICD-10-CM | POA: Insufficient documentation

## 2017-04-26 DIAGNOSIS — Z79899 Other long term (current) drug therapy: Secondary | ICD-10-CM | POA: Insufficient documentation

## 2017-04-26 DIAGNOSIS — S39012A Strain of muscle, fascia and tendon of lower back, initial encounter: Secondary | ICD-10-CM | POA: Diagnosis not present

## 2017-04-26 DIAGNOSIS — S0990XA Unspecified injury of head, initial encounter: Secondary | ICD-10-CM | POA: Diagnosis present

## 2017-04-26 DIAGNOSIS — S01112A Laceration without foreign body of left eyelid and periocular area, initial encounter: Secondary | ICD-10-CM

## 2017-04-26 DIAGNOSIS — Y999 Unspecified external cause status: Secondary | ICD-10-CM | POA: Diagnosis not present

## 2017-04-26 DIAGNOSIS — Y92410 Unspecified street and highway as the place of occurrence of the external cause: Secondary | ICD-10-CM | POA: Diagnosis not present

## 2017-04-26 DIAGNOSIS — E119 Type 2 diabetes mellitus without complications: Secondary | ICD-10-CM | POA: Diagnosis not present

## 2017-04-26 DIAGNOSIS — Z7982 Long term (current) use of aspirin: Secondary | ICD-10-CM | POA: Diagnosis not present

## 2017-04-26 DIAGNOSIS — S20219A Contusion of unspecified front wall of thorax, initial encounter: Secondary | ICD-10-CM | POA: Insufficient documentation

## 2017-04-26 DIAGNOSIS — E876 Hypokalemia: Secondary | ICD-10-CM | POA: Insufficient documentation

## 2017-04-26 DIAGNOSIS — S161XXA Strain of muscle, fascia and tendon at neck level, initial encounter: Secondary | ICD-10-CM | POA: Insufficient documentation

## 2017-04-26 DIAGNOSIS — Z87891 Personal history of nicotine dependence: Secondary | ICD-10-CM | POA: Diagnosis not present

## 2017-04-26 DIAGNOSIS — Z955 Presence of coronary angioplasty implant and graft: Secondary | ICD-10-CM | POA: Insufficient documentation

## 2017-04-26 LAB — COMPREHENSIVE METABOLIC PANEL
ALK PHOS: 86 U/L (ref 38–126)
ALT: 13 U/L — AB (ref 17–63)
AST: 21 U/L (ref 15–41)
Albumin: 3.8 g/dL (ref 3.5–5.0)
Anion gap: 8 (ref 5–15)
BILIRUBIN TOTAL: 0.6 mg/dL (ref 0.3–1.2)
BUN: 10 mg/dL (ref 6–20)
CALCIUM: 8.9 mg/dL (ref 8.9–10.3)
CHLORIDE: 99 mmol/L — AB (ref 101–111)
CO2: 27 mmol/L (ref 22–32)
CREATININE: 0.77 mg/dL (ref 0.61–1.24)
GFR calc Af Amer: >60 mL/min (ref >60)
Glucose, Bld: 117 mg/dL — ABNORMAL HIGH (ref 65–99)
Potassium: 3.2 mmol/L — ABNORMAL LOW (ref 3.5–5.1)
Sodium: 134 mmol/L — ABNORMAL LOW (ref 135–145)
TOTAL PROTEIN: 6.7 g/dL (ref 6.5–8.1)

## 2017-04-26 LAB — CBC WITH DIFFERENTIAL/PLATELET
Basophils Absolute: 0 10*3/uL (ref 0.0–0.1)
Basophils Relative: 0 %
Eosinophils Absolute: 0.1 10*3/uL (ref 0.0–0.7)
Eosinophils Relative: 2 %
HCT: 32.2 % — ABNORMAL LOW (ref 39.0–52.0)
Hemoglobin: 10.5 g/dL — ABNORMAL LOW (ref 13.0–17.0)
Lymphocytes Relative: 29 %
Lymphs Abs: 1.5 10*3/uL (ref 0.7–4.0)
MCH: 26.8 pg (ref 26.0–34.0)
MCHC: 32.6 g/dL (ref 30.0–36.0)
MCV: 82.1 fL (ref 78.0–100.0)
Monocytes Absolute: 0.5 10*3/uL (ref 0.1–1.0)
Monocytes Relative: 9 %
Neutro Abs: 3.3 10*3/uL (ref 1.7–7.7)
Neutrophils Relative %: 60 %
Platelets: 234 10*3/uL (ref 150–400)
RBC: 3.92 MIL/uL — ABNORMAL LOW (ref 4.22–5.81)
RDW: 14.3 % (ref 11.5–15.5)
WBC: 5.4 10*3/uL (ref 4.0–10.5)

## 2017-04-26 LAB — I-STAT CG4 LACTIC ACID, ED: Lactic Acid, Venous: 1.01 mmol/L (ref 0.5–1.9)

## 2017-04-26 LAB — ETHANOL

## 2017-04-26 MED ORDER — POTASSIUM CHLORIDE CRYS ER 20 MEQ PO TBCR
40.0000 meq | EXTENDED_RELEASE_TABLET | Freq: Once | ORAL | Status: AC
  Administered 2017-04-26: 40 meq via ORAL
  Filled 2017-04-26: qty 2

## 2017-04-26 MED ORDER — HYDROCODONE-ACETAMINOPHEN 5-325 MG PO TABS: 2.0000 | ORAL_TABLET | ORAL | 0 refills | Status: DC | PRN

## 2017-04-26 MED ORDER — GENTAMICIN SULFATE 0.1 % EX OINT: 1.0000 "application " | TOPICAL_OINTMENT | Freq: Three times a day (TID) | CUTANEOUS | 0 refills | Status: DC

## 2017-04-26 MED ORDER — MORPHINE SULFATE (PF) 4 MG/ML IV SOLN
4.0000 mg | Freq: Once | INTRAVENOUS | Status: AC
  Administered 2017-04-26: 4 mg via INTRAVENOUS
  Filled 2017-04-26: qty 1

## 2017-04-26 MED ORDER — ONDANSETRON HCL 4 MG/2ML IJ SOLN
4.0000 mg | Freq: Once | INTRAMUSCULAR | Status: AC
  Administered 2017-04-26: 4 mg via INTRAVENOUS
  Filled 2017-04-26: qty 2

## 2017-04-26 MED ORDER — LIDOCAINE HCL 2 % IJ SOLN
5.0000 mL | Freq: Once | INTRAMUSCULAR | Status: AC
Start: 2017-04-26 — End: 2017-04-26
  Administered 2017-04-26: 100 mg via INTRADERMAL
  Filled 2017-04-26: qty 20

## 2017-04-26 MED ORDER — IOPAMIDOL (ISOVUE-300) INJECTION 61%
INTRAVENOUS | Status: AC
  Administered 2017-04-26: 100 mL
  Filled 2017-04-26: qty 100
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
## 2017-04-26 NOTE — ED Notes (Signed)
Taken to CT at this time. 

## 2017-04-26 NOTE — Discharge Instructions (Signed)
Motrin for mild pain.  Vicodin for moderate pain.  Apply ointment to eyelid laceration daily.  Eyelid suture removal 7-10 days.
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
## 2017-04-26 NOTE — ED Provider Notes (Signed)
Patient CT scan is reviewed. No acute abnormalities noted. Enlargement of thyroid noted. I discussed this with patient that he should follow-up with his primary care physician to discuss additional imaging including ultrasound and thyroid studies. He expressed understanding of this. This was given to him in written form. He has no acute findings or posttraumatic. No fractures. No internal injuries. His eyelid laceration was repaired. Discharge home. Vicodin. Work note. ER with acute changes.   Tanna Furry, MD 04/26/17 1053

## 2017-04-26 NOTE — ED Provider Notes (Signed)
I was requested to perform laceration repair of L eyelid  LACERATION REPAIR Performed by: Domenic Moras Authorized byDomenic Moras Consent: Verbal consent obtained. Risks and benefits: risks, benefits and alternatives were discussed Consent given by: patient Patient identity confirmed: provided demographic data Prepped and Draped in normal sterile fashion Wound explored  Laceration Location: L eyelid, superficial, no exposure of tarsal plate or fat protrusion.  Laceration Length: 3cm  No Foreign Bodies seen or palpated  Anesthesia: local infiltration  Local anesthetic: lidocaine 2% w/o epinephrine  Anesthetic total: 1 ml  Irrigation method: syringe Amount of cleaning: standard  Skin closure: vicryl 6.0  Number of sutures: 6  Technique: simple interrupted  Patient tolerance: Patient tolerated the procedure well with no immediate complications.    Domenic Moras, PA-C 15/40/08 [id number]    Delora Fuel, MD 95/09/32 2250

## 2017-04-28 ENCOUNTER — Ambulatory Visit (INDEPENDENT_AMBULATORY_CARE_PROVIDER_SITE_OTHER): Payer: BLUE CROSS/BLUE SHIELD | Admitting: Family Medicine

## 2017-04-28 ENCOUNTER — Encounter: Payer: Self-pay | Admitting: Family Medicine

## 2017-04-28 VITALS — BP 132/68 | HR 65 | Temp 98.5°F | Resp 16 | Ht 73.0 in | Wt 192.6 lb

## 2017-04-28 DIAGNOSIS — S01119D Laceration without foreign body of unspecified eyelid and periocular area, subsequent encounter: Secondary | ICD-10-CM

## 2017-04-28 DIAGNOSIS — E785 Hyperlipidemia, unspecified: Secondary | ICD-10-CM

## 2017-04-28 DIAGNOSIS — E1165 Type 2 diabetes mellitus with hyperglycemia: Secondary | ICD-10-CM

## 2017-04-28 DIAGNOSIS — Z23 Encounter for immunization: Secondary | ICD-10-CM

## 2017-04-28 DIAGNOSIS — E119 Type 2 diabetes mellitus without complications: Secondary | ICD-10-CM | POA: Diagnosis not present

## 2017-04-28 DIAGNOSIS — IMO0001 Reserved for inherently not codable concepts without codable children: Secondary | ICD-10-CM

## 2017-04-28 DIAGNOSIS — I10 Essential (primary) hypertension: Secondary | ICD-10-CM

## 2017-04-28 DIAGNOSIS — E042 Nontoxic multinodular goiter: Secondary | ICD-10-CM

## 2017-04-28 MED ORDER — ATORVASTATIN CALCIUM 10 MG PO TABS: ORAL_TABLET | ORAL | 1 refills | Status: DC

## 2017-04-28 MED ORDER — LISINOPRIL-HYDROCHLOROTHIAZIDE 20-25 MG PO TABS: 1.0000 | ORAL_TABLET | Freq: Every day | ORAL | 1 refills | Status: DC

## 2017-04-28 MED ORDER — METFORMIN HCL 500 MG PO TABS: ORAL_TABLET | ORAL | 1 refills | Status: DC
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
## 2017-04-29 LAB — LIPID PANEL
CHOL/HDL RATIO: 3 ratio (ref 0.0–5.0)
Cholesterol, Total: 149 mg/dL (ref 100–199)
HDL: 50 mg/dL (ref >39)
LDL CALC: 85 mg/dL (ref 0–99)
Triglycerides: 70 mg/dL (ref 0–149)
VLDL CHOLESTEROL CAL: 14 mg/dL (ref 5–40)

## 2017-04-29 LAB — COMPREHENSIVE METABOLIC PANEL
ALT: 13 IU/L (ref 0–44)
AST: 21 IU/L (ref 0–40)
Albumin/Globulin Ratio: 1.8 (ref 1.2–2.2)
Albumin: 4.6 g/dL (ref 3.6–4.8)
Alkaline Phosphatase: 113 IU/L (ref 39–117)
BUN/Creatinine Ratio: 13 (ref 10–24)
BUN: 9 mg/dL (ref 8–27)
Bilirubin Total: 0.3 mg/dL (ref 0.0–1.2)
CO2: 25 mmol/L (ref 20–29)
CREATININE: 0.71 mg/dL — AB (ref 0.76–1.27)
Calcium: 9.2 mg/dL (ref 8.6–10.2)
Chloride: 101 mmol/L (ref 96–106)
GFR, EST AFRICAN AMERICAN: 118 mL/min/{1.73_m2} (ref >59)
GFR, EST NON AFRICAN AMERICAN: 102 mL/min/{1.73_m2} (ref >59)
Globulin, Total: 2.6 g/dL (ref 1.5–4.5)
Glucose: 112 mg/dL — ABNORMAL HIGH (ref 65–99)
Potassium: 4 mmol/L (ref 3.5–5.2)
Sodium: 143 mmol/L (ref 134–144)
TOTAL PROTEIN: 7.2 g/dL (ref 6.0–8.5)

## 2017-04-29 LAB — HEMOGLOBIN A1C
Est. average glucose Bld gHb Est-mCnc: 140 mg/dL
Hgb A1c MFr Bld: 6.5 % — ABNORMAL HIGH (ref 4.8–5.6)

## 2017-05-06 ENCOUNTER — Telehealth: Payer: Self-pay | Admitting: Family Medicine

## 2017-05-06 NOTE — Telephone Encounter (Signed)
Informed of results.  

## 2017-05-06 NOTE — Telephone Encounter (Signed)
Copied from [place]. Topic: General - Other >> May 06, 2017 12:07 PM Darl Householder, RMA wrote: Reason for CRM: pt is requesting a call back for lab results he is unable to access his MyChart at this time, please call pt

## 2017-05-10 ENCOUNTER — Other Ambulatory Visit: Payer: Self-pay | Admitting: Family Medicine

## 2017-05-10 ENCOUNTER — Other Ambulatory Visit: Payer: Self-pay | Admitting: Physician Assistant

## 2017-05-10 DIAGNOSIS — I10 Essential (primary) hypertension: Secondary | ICD-10-CM

## 2017-05-10 DIAGNOSIS — IMO0001 Reserved for inherently not codable concepts without codable children: Secondary | ICD-10-CM

## 2017-05-10 DIAGNOSIS — E1165 Type 2 diabetes mellitus with hyperglycemia: Principal | ICD-10-CM

## 2017-05-12 ENCOUNTER — Ambulatory Visit
Admission: RE | Admit: 2017-05-12 | Discharge: 2017-05-12 | Disposition: A | Discharge status: Home / Self Care | Payer: BLUE CROSS/BLUE SHIELD | Source: Ambulatory Visit | Attending: Family Medicine | Admitting: Family Medicine

## 2017-05-12 ENCOUNTER — Other Ambulatory Visit: Payer: Self-pay | Admitting: Physician Assistant

## 2017-05-12 DIAGNOSIS — E042 Nontoxic multinodular goiter: Secondary | ICD-10-CM

## 2017-05-12 DIAGNOSIS — I10 Essential (primary) hypertension: Secondary | ICD-10-CM

## 2017-05-13 ENCOUNTER — Telehealth: Payer: Self-pay | Admitting: Family Medicine

## 2017-05-13 NOTE — Telephone Encounter (Signed)
Copied from [place] [id number]. Topic: Quick Communication - See Telephone Encounter >> May 13, 2017  4:18 PM Percell Belt A wrote: CRM for notification. See Telephone encounter for:  Pt called in and said that he would like a nurse to call him back with the results of the test he had done yesterday.  I did not see them resulted yet  05/13/17.

## 2017-05-19 ENCOUNTER — Encounter: Payer: Self-pay | Admitting: Family Medicine

## 2017-05-19 DIAGNOSIS — E041 Nontoxic single thyroid nodule: Secondary | ICD-10-CM

## 2017-05-29 ENCOUNTER — Encounter: Payer: Self-pay | Admitting: Family Medicine

## 2017-06-06 ENCOUNTER — Encounter: Payer: Self-pay | Admitting: Family Medicine

## 2017-06-06 ENCOUNTER — Ambulatory Visit (INDEPENDENT_AMBULATORY_CARE_PROVIDER_SITE_OTHER): Payer: BLUE CROSS/BLUE SHIELD | Admitting: Family Medicine

## 2017-06-06 VITALS — BP 124/72 | HR 63 | Temp 98.0°F | Resp 17 | Ht 73.0 in | Wt 192.0 lb

## 2017-06-06 DIAGNOSIS — S01112D Laceration without foreign body of left eyelid and periocular area, subsequent encounter: Secondary | ICD-10-CM | POA: Diagnosis not present
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
## 2017-06-06 NOTE — Patient Instructions (Addendum)
Sutures removed on surface. Any remaining material inside lid should continue to absorb/resolve. If any swelling, redness, or discharge form area - return to recheck that area.   IF you received an x-ray today, you will receive an invoice from [facility]. Please contact [facility] at [phone] with questions or concerns regarding your invoice.   IF you received labwork today, you will receive an invoice from [place]. Please contact [facility] at [phone] with questions or concerns regarding your invoice.   Our billing staff will not be able to assist you with questions regarding bills from these companies.  You will be contacted with the lab results as soon as they are available. The fastest way to get your results is to activate your My Chart account. Instructions are located on the last page of this paperwork. If you have not heard from [place] regarding the results in 2 weeks, please contact this office.

## 2017-07-06 ENCOUNTER — Other Ambulatory Visit: Payer: Self-pay | Admitting: Family Medicine

## 2017-07-06 DIAGNOSIS — K219 Gastro-esophageal reflux disease without esophagitis: Secondary | ICD-10-CM

## 2017-08-05 ENCOUNTER — Other Ambulatory Visit: Payer: Self-pay | Admitting: Family Medicine

## 2017-08-05 DIAGNOSIS — IMO0001 Reserved for inherently not codable concepts without codable children: Secondary | ICD-10-CM

## 2017-08-05 DIAGNOSIS — E1165 Type 2 diabetes mellitus with hyperglycemia: Principal | ICD-10-CM

## 2017-09-14 ENCOUNTER — Other Ambulatory Visit: Payer: Self-pay | Admitting: Family Medicine

## 2017-09-14 DIAGNOSIS — IMO0001 Reserved for inherently not codable concepts without codable children: Secondary | ICD-10-CM

## 2017-09-14 DIAGNOSIS — E1165 Type 2 diabetes mellitus with hyperglycemia: Secondary | ICD-10-CM

## 2017-09-14 DIAGNOSIS — E785 Hyperlipidemia, unspecified: Secondary | ICD-10-CM

## 2017-11-12 ENCOUNTER — Other Ambulatory Visit: Payer: Self-pay | Admitting: Family Medicine

## 2017-11-12 DIAGNOSIS — IMO0001 Reserved for inherently not codable concepts without codable children: Secondary | ICD-10-CM

## 2017-11-12 DIAGNOSIS — E1165 Type 2 diabetes mellitus with hyperglycemia: Principal | ICD-10-CM

## 2017-11-12 DIAGNOSIS — I10 Essential (primary) hypertension: Secondary | ICD-10-CM

## 2018-01-06 LAB — HM DIABETES EYE EXAM

## 2018-01-13 ENCOUNTER — Other Ambulatory Visit: Payer: Self-pay | Admitting: Family Medicine

## 2018-01-13 DIAGNOSIS — K219 Gastro-esophageal reflux disease without esophagitis: Secondary | ICD-10-CM

## 2018-01-22 ENCOUNTER — Encounter: Payer: Self-pay | Admitting: *Deleted

## 2018-02-16 ENCOUNTER — Other Ambulatory Visit: Payer: Self-pay | Admitting: Family Medicine

## 2018-02-16 DIAGNOSIS — K219 Gastro-esophageal reflux disease without esophagitis: Secondary | ICD-10-CM

## 2018-02-16 NOTE — Telephone Encounter (Signed)
Patient called, left VM to return call to the office to schedule and appointment.

## 2018-02-16 NOTE — Telephone Encounter (Signed)
Called patient to schedule an office visit to cont to get his prescription refill for Protonix 40 mg tab.  No answer, left message to call back and schedule.   LOV 06/06/17 Dr Arlana Pouch [phone]

## 2018-02-19 ENCOUNTER — Other Ambulatory Visit: Payer: Self-pay | Admitting: Family Medicine

## 2018-02-19 DIAGNOSIS — K219 Gastro-esophageal reflux disease without esophagitis: Secondary | ICD-10-CM

## 2018-03-11 ENCOUNTER — Other Ambulatory Visit: Payer: Self-pay | Admitting: Family Medicine

## 2018-03-11 DIAGNOSIS — K219 Gastro-esophageal reflux disease without esophagitis: Secondary | ICD-10-CM

## 2018-03-11 NOTE — Telephone Encounter (Signed)
Pantoprazole 40 mg refill Last Refill:01/13/18 # 30 Last OV: 06/06/17 PCP: Carlota Raspberry Pharmacy:[facility]  Not filled: not addressed at recent visits

## 2018-05-17 ENCOUNTER — Other Ambulatory Visit: Payer: Self-pay | Admitting: Family Medicine

## 2018-05-17 DIAGNOSIS — E785 Hyperlipidemia, unspecified: Secondary | ICD-10-CM

## 2018-05-17 DIAGNOSIS — K219 Gastro-esophageal reflux disease without esophagitis: Secondary | ICD-10-CM

## 2018-05-17 DIAGNOSIS — I10 Essential (primary) hypertension: Secondary | ICD-10-CM

## 2018-05-18 NOTE — Telephone Encounter (Signed)
Called patient to schedule an appointment for his refills. Pt states that he is not working so he can not get his refills. He will call back this week and schedule an appointment. He was told that we would pass on the message to his provider regarding his prescriptions. Pt voiced understanding. LOV  06/06/17 Need refills on panoprazole 40 mg LR 01/13/18 for 30 tabs, atorvastatin 10 mg, LR 09/15/17 for 90 tabs and 1 refill, lisinopril-hydrochlorothiazide 20-25 mg for 90 tabs and 1 refill. Last labs  04/28/17

## 2018-05-19 NOTE — Telephone Encounter (Signed)
Pt calling about this medicines that need refilled he is at pharmacy I advised pt to go back home until pharmacist call him when medicine is ready to be picked up pt agree

## 2018-06-11 ENCOUNTER — Other Ambulatory Visit: Payer: Self-pay | Admitting: Family Medicine

## 2018-06-11 ENCOUNTER — Telehealth: Payer: Self-pay | Admitting: Family Medicine

## 2018-06-11 DIAGNOSIS — E785 Hyperlipidemia, unspecified: Secondary | ICD-10-CM

## 2018-06-11 DIAGNOSIS — K219 Gastro-esophageal reflux disease without esophagitis: Secondary | ICD-10-CM

## 2018-06-11 NOTE — Telephone Encounter (Signed)
Attempted to call patient to make annual exam appointment- patient disconnected call. He wanted to know if he owed money.

## 2018-06-11 NOTE — Telephone Encounter (Signed)
Patient called in and scheduled his physical appt for 07/22/2017 at 9;20am. He states he will be completely out of meds by the time of his physical.

## 2018-06-11 NOTE — Telephone Encounter (Signed)
Courtesy refill until appointment 07/22/18.

## 2018-06-14 ENCOUNTER — Other Ambulatory Visit: Payer: Self-pay | Admitting: Family Medicine

## 2018-06-14 DIAGNOSIS — E785 Hyperlipidemia, unspecified: Secondary | ICD-10-CM

## 2018-06-19 NOTE — Telephone Encounter (Signed)
Pt states he has received medication that will carry him through to appt.  Does not need any refills at this time.

## 2018-07-05 ENCOUNTER — Other Ambulatory Visit: Payer: Self-pay | Admitting: Family Medicine

## 2018-07-07 ENCOUNTER — Other Ambulatory Visit: Payer: Self-pay | Admitting: Family Medicine

## 2018-07-07 DIAGNOSIS — E785 Hyperlipidemia, unspecified: Secondary | ICD-10-CM

## 2018-07-07 DIAGNOSIS — E1165 Type 2 diabetes mellitus with hyperglycemia: Principal | ICD-10-CM

## 2018-07-07 DIAGNOSIS — IMO0001 Reserved for inherently not codable concepts without codable children: Secondary | ICD-10-CM

## 2018-07-22 ENCOUNTER — Ambulatory Visit (INDEPENDENT_AMBULATORY_CARE_PROVIDER_SITE_OTHER): Payer: BLUE CROSS/BLUE SHIELD | Admitting: Family Medicine

## 2018-07-22 ENCOUNTER — Encounter: Payer: Self-pay | Admitting: Family Medicine

## 2018-07-22 ENCOUNTER — Other Ambulatory Visit: Payer: Self-pay

## 2018-07-22 VITALS — BP 128/76 | HR 60 | Temp 97.9°F | Ht 73.0 in | Wt 205.6 lb

## 2018-07-22 DIAGNOSIS — Z114 Encounter for screening for human immunodeficiency virus [HIV]: Secondary | ICD-10-CM | POA: Diagnosis not present

## 2018-07-22 DIAGNOSIS — E785 Hyperlipidemia, unspecified: Secondary | ICD-10-CM

## 2018-07-22 DIAGNOSIS — I10 Essential (primary) hypertension: Secondary | ICD-10-CM

## 2018-07-22 DIAGNOSIS — Z1159 Encounter for screening for other viral diseases: Secondary | ICD-10-CM | POA: Diagnosis not present

## 2018-07-22 DIAGNOSIS — B356 Tinea cruris: Secondary | ICD-10-CM

## 2018-07-22 DIAGNOSIS — K219 Gastro-esophageal reflux disease without esophagitis: Secondary | ICD-10-CM

## 2018-07-22 DIAGNOSIS — E119 Type 2 diabetes mellitus without complications: Secondary | ICD-10-CM

## 2018-07-22 DIAGNOSIS — R21 Rash and other nonspecific skin eruption: Secondary | ICD-10-CM

## 2018-07-22 MED ORDER — LISINOPRIL-HYDROCHLOROTHIAZIDE 20-25 MG PO TABS: 1.0000 | ORAL_TABLET | Freq: Every day | ORAL | 1 refills | Status: DC

## 2018-07-22 MED ORDER — PANTOPRAZOLE SODIUM 40 MG PO TBEC: 40.0000 mg | DELAYED_RELEASE_TABLET | Freq: Every day | ORAL | 1 refills | Status: DC

## 2018-07-22 MED ORDER — TRIAMCINOLONE ACETONIDE 0.1 % EX CREA: 1.0000 "application " | TOPICAL_CREAM | Freq: Two times a day (BID) | CUTANEOUS | 0 refills | Status: DC

## 2018-07-22 MED ORDER — CLOTRIMAZOLE 1 % EX CREA: 1.0000 "application " | TOPICAL_CREAM | Freq: Two times a day (BID) | CUTANEOUS | 0 refills | Status: DC

## 2018-07-22 MED ORDER — ATORVASTATIN CALCIUM 10 MG PO TABS: 10.0000 mg | ORAL_TABLET | Freq: Every day | ORAL | 1 refills | Status: DC

## 2018-07-22 NOTE — Progress Notes (Signed)
Subjective:    Patient ID: Brendan Gonzales, male    DOB: 1956-01-23, 63 y.o.   MRN: [id number]  HPI Brendan Gonzales is a 63 y.o. male Presents today for: Chief Complaint  Patient presents with  . Annual Exam    CPE  Initially presented for a complete physical, but I have not seen him since 2018.  Will review chronic medical problems today and reschedule physical.  Last visit for chronic medical problems in November 2018 with plan to recheck in 3 months. Has been in [place] past 2 and 1/2 months, no reason for not following up prior. Denies barriers to care.   Diabetes: Previously controlled in 2018.  Currently taking metformin 500 mg 2 tabs BID. No new side effects. Denies missed doses.   Microalbumin: Testing today. Optho, foot exam, pneumovax: Ophthalmology 01/06/2018, Pneumovax 06/13/2013 He is on Lipitor as a statin, aspirin 81 mg daily, and ACE inhibitor.  Diabetic Foot Exam - Simple   Simple Foot Form Diabetic Foot exam was performed with the following findings:  Yes 07/22/2018  9:05 AM  Visual Inspection No deformities, no ulcerations, no other skin breakdown bilaterally:  Yes Sensation Testing Intact to touch and monofilament testing bilaterally:  Yes Pulse Check Posterior Tibialis and Dorsalis pulse intact bilaterally:  Yes Comments    Lab Results  Component Value Date   HGBA1C 6.5 (H) 04/28/2017   HGBA1C 6.8 (H) 12/21/2016   HGBA1C 6.9 (H) 07/29/2016   Lab Results  Component Value Date   MICROALBUR 3.9 10/02/2015   LDLCALC 85 04/28/2017   CREATININE 0.71 (L) 04/28/2017   Hypertension: BP Readings from Last 3 Encounters:  07/22/18 128/76  06/06/17 124/72  04/28/17 132/68   Lab Results  Component Value Date   CREATININE 0.71 (L) 04/28/2017  Takes lisinopril HCTZ 20/25 mg daily.  Home readings occasional - "up and down"  Hyperlipidemia:  Lab Results  Component Value Date   CHOL 149 04/28/2017   HDL 50 04/28/2017   LDLCALC 85 04/28/2017   TRIG 70  04/28/2017   CHOLHDL 3.0 04/28/2017   Lab Results  Component Value Date   ALT 13 04/28/2017   AST 21 04/28/2017   ALKPHOS 113 04/28/2017   BILITOT 0.3 04/28/2017  Lipitor 10 mg daily. No new side effects.    Heartburn/GERD: Has taken Protonix 40 mg in past, but ran out. Taking over the counter Nexium 66m daily.  Has not tried QOD dosing. Sx's controlled.   Health maintenance: Hep C screening today.  Otherwise up-to-date  Also requesting cream for rash on hands and groin used in past. Itching dark area in groin. Dry skin b/t fingertips.     Patient Active Problem List   Diagnosis Date Noted  . Eczema of hand 10/19/2014  . Type 2 diabetes mellitus ([place] 06/13/2013  . HTN (hypertension) 06/13/2013  . Normal coronary arteries- 2008 06/13/2013  . Chest pain with moderate risk of acute coronary syndrome 06/12/2013  . Special screening for malignant neoplasms, colon 06/27/2011  . Arthritis of knee 06/14/2011   Past Medical History:  Diagnosis Date  . Arthritis   . Constipation   . Diabetes mellitus    type 2  . Dizziness   . Headache(784.0)   . Hyperlipidemia   . Hypertension    benign   Past Surgical History:  Procedure Laterality Date  . COLONOSCOPY    . CORONARY ANGIOPLASTY    . HEMORRHOID SURGERY    . right femeroral bypass    . right  knee surgery     No Known Allergies Prior to Admission medications   Medication Sig Start Date End Date Taking? Authorizing Provider  acetaminophen (TYLENOL) 500 MG tablet Take 500 mg by mouth 3 (three) times daily as needed for mild pain.    Yes [provider]  aspirin 81 MG tablet Take 81 mg by mouth every morning.    Yes [provider]  atorvastatin (LIPITOR) 10 MG tablet TAKE 1 TABLET BY MOUTH ONCE DAILY 06/15/18  Yes [name], MD  Calcium Carbonate Antacid (TUMS ULTRA 1000 PO) Take 1 tablet by mouth 3 (three) times daily as needed (indigestion).   Yes [provider]  Cyanocobalamin  (VITAMIN B 12 PO) Take 1,000 mg by mouth daily.   Yes [provider]  Garlic 0932 MG CAPS Take 1,000 mg by mouth daily.    Yes [provider]  gentamicin ointment (GARAMYCIN) 0.1 % Apply 1 application topically 3 (three) times daily. 04/26/17  Yes Tanna Furry, MD  lisinopril-hydrochlorothiazide (PRINZIDE,ZESTORETIC) 20-25 MG tablet TAKE 1 TABLET BY MOUTH ONCE DAILY 05/19/18  Yes [name], MD  metFORMIN (GLUCOPHAGE) 500 MG tablet TAKE TWO TABLETS BY MOUTH TWICE DAILY WITH MEALS 04/28/17  Yes [name], MD  pantoprazole (PROTONIX) 40 MG tablet Take 1 tablet (40 mg total) by mouth daily. Must keep 07/13/18 appointment for more refills. 06/11/18  Yes [name], MD   Social History   Socioeconomic History  . Marital status: Married    Spouse name: Not on file  . Number of children: Not on file  . Years of education: Not on file  . Highest education level: Not on file  Occupational History  . Not on file  Social Needs  . Financial resource strain: Not on file  . Food insecurity:    Worry: Not on file    Inability: Not on file  . Transportation needs:    Medical: Not on file    Non-medical: Not on file  Tobacco Use  . Smoking status: Former Research scientist (life sciences)  . Smokeless tobacco: Never Used  Substance and Sexual Activity  . Alcohol use: No  . Drug use: No  . Sexual activity: Never  Lifestyle  . Physical activity:    Days per week: Not on file    Minutes per session: Not on file  . Stress: Not on file  Relationships  . Social connections:    Talks on phone: Not on file    Gets together: Not on file    Attends religious service: Not on file    Active member of club or organization: Not on file    Attends meetings of clubs or organizations: Not on file    Relationship status: Not on file  . Intimate partner violence:    Fear of current or ex partner: Not on file    Emotionally abused: Not on file    Physically abused: Not on file    Forced sexual  activity: Not on file  Other Topics Concern  . Not on file  Social History Narrative  . Not on file    Review of Systems  Constitutional: Negative for fatigue and unexpected weight change.  Eyes: Negative for visual disturbance.  Respiratory: Negative for cough, chest tightness and shortness of breath.   Cardiovascular: Negative for chest pain, palpitations and leg swelling.  Gastrointestinal: Negative for abdominal pain and blood in stool.  Skin: Positive for rash.  Neurological: Negative for dizziness and light-headedness.  Objective:   Physical Exam Vitals signs reviewed.  Constitutional:      Appearance: He is well-developed.  HENT:     Head: Normocephalic and atraumatic.  Eyes:     Pupils: Pupils are equal, round, and reactive to light.  Neck:     Vascular: No carotid bruit or JVD.  Cardiovascular:     Rate and Rhythm: Normal rate and regular rhythm.     Heart sounds: Normal heart sounds. No murmur.  Pulmonary:     Effort: Pulmonary effort is normal.     Breath sounds: Normal breath sounds. No rales.  Skin:    General: Skin is warm and dry.       Neurological:     Mental Status: He is alert and oriented to person, place, and time.    Vitals:   07/22/18 0854 07/22/18 0911  BP: (!) 155/76 128/76  Pulse: 60   Temp: 97.9 F (36.6 C)   TempSrc: Oral   SpO2: 97%   Weight: 205 lb 9.6 oz (93.3 kg)   Height: 6' 1" (1.854 m)        Assessment & Plan:    Copeland Neisen is a 63 y.o. male Hyperlipidemia, unspecified hyperlipidemia type - Plan: Lipid panel, Comprehensive metabolic panel, atorvastatin (LIPITOR) 10 MG tablet  -  Stable, tolerating current regimen. Medications refilled. Labs pending as above.   Type 2 diabetes mellitus without complication, without long-term current use of insulin (HCC) - Plan: Hemoglobin A1c, Microalbumin / creatinine urine ratio  -Denies any side effects of medications.  Labs and testing, labs as above.  Handout given on  follow-up intervals for diabetes.  Denies any barriers to care.  Need for hepatitis C screening test - Plan: Hepatitis C antibody  Screening for HIV (human immunodeficiency virus) - Plan: HIV Antibody (routine testing w rflx)  Essential hypertension - Plan: Comprehensive metabolic panel, lisinopril-hydrochlorothiazide (PRINZIDE,ZESTORETIC) 20-25 MG tablet  -Stable, continue same dose, labs pending  Tinea cruris - Plan: clotrimazole (LOTRIMIN) 1 % cream  -Apply twice daily, continue 1 week after symptoms improved.  RTC precautions if persistent  Rash of hands - Plan: triamcinolone cream (KENALOG) 0.1 %  -Atopic dermatitis with dry skin from weather likely.  Lotion recommended for hydration, triamcinolone if needed for itching.  Can recheck at next visit.  Gastroesophageal reflux disease, esophagitis presence not specified - Plan: pantoprazole (PROTONIX) 40 MG tablet  -Refilled Protonix, option of every other day dosing discussed if it controls symptoms.  If persistent need for daily dosing, consider magnesium, B12, vitamin D testing   Complete physical was deferred given need for follow-up of above conditions.  Did have some other nonacute issues on review of systems and plan to follow-up in the next 3 to 4 weeks.  Additionally can follow-up for physical to review other health maintenance not discussed as above.  RTC/ER precautions given if any worsening symptoms  Meds ordered this encounter  Medications  . clotrimazole (LOTRIMIN) 1 % cream    Sig: Apply 1 application topically 2 (two) times daily.    Dispense:  30 g    Refill:  0  . triamcinolone cream (KENALOG) 0.1 %    Sig: Apply 1 application topically 2 (two) times daily.    Dispense:  30 g    Refill:  0  . atorvastatin (LIPITOR) 10 MG tablet    Sig: Take 1 tablet (10 mg total) by mouth daily.    Dispense:  90 tablet    Refill:  1  .  lisinopril-hydrochlorothiazide (PRINZIDE,ZESTORETIC) 20-25 MG tablet    Sig: Take 1 tablet by  mouth daily.    Dispense:  90 tablet    Refill:  1  . pantoprazole (PROTONIX) 40 MG tablet    Sig: Take 1 tablet (40 mg total) by mouth daily.    Dispense:  90 tablet    Refill:  1   Patient Instructions   See info below on checking blood pressures. Bring machine to next visit to check accuracy.   You can try taking the protonix every other day to see if that works as well.  If more heartburn then return to daily use.   Triamcinolone cream to the hand rash, but also use Eucerin or other hydrating lotion multiple times per day.   Clotrimazole cream to the groin rash.  Use until that rash is improved and continue for 1 additional week.  Please follow up in next 3-4 weeks to review other concerns from form today, and we can schedule a physical at a later date as well.  If there are any concerns which were not addressed today that you feel are of an acute or emergent nature, or acutely worsening prior to evaluation here, please proceed directly to the emergency room or call 911.  Return to the clinic or go to the nearest emergency room if any of your symptoms worsen or new symptoms occur.   Jock Itch Jock itch (tinea cruris) is an infection of the skin in the groin area. It is caused by a fungus, which is a type of germ that lives in dark, damp places. Jock itch causes an itchy rash in the groin and upper thigh area. It usually goes away in 2-3 weeks with treatment. What are the causes? The fungus that causes jock itch may be spread by:  Touching a fungus infection elsewhere on your body, such as athlete's foot, and then touching your groin area.  Sharing towels or clothing, such as socks or shoes, with someone who has a fungal infection. What increases the risk? Jock itch is most common in men and adolescent boys. You are also more likely to develop the condition if you:  Are in a hot, humid climate.  Wear tight-fitting clothing or wet bathing suits for long periods of time.  Play  sports.  Are overweight.  Have diabetes.  Have a weakened immune system.  Sweat a lot. What are the signs or symptoms? Symptoms of jock itch may include:  A red, pink, or brown rash in the groin area. Blisters may be present. The rash may spread to the thighs, anus, and buttocks.  Dry and scaly skin on or around the rash.  Itchiness. How is this diagnosed? In most cases, your health care provider can make the diagnosis by looking at your rash. In some cases, a sample of infected skin may be scraped off. This sample may be examined under a microscope (biopsy) or by trying to grow the fungus from the sample (culture). How is this treated? Treatment for this condition may include:  Antifungal medicine to kill the fungus. This may be a skin cream, ointment, or powder, or it may be a medicine that you take by mouth.  Skin cream or ointment to reduce itching.  Lifestyle changes, such as wearing looser clothing and caring for your skin. Follow these instructions at home: Skin care  Apply skin creams, ointments, or powders exactly as told by your health care provider.  Wear loose-fitting clothing that does not rub  against your groin area. Men should wear boxer shorts or loose-fitting underwear.  Keep your groin area clean and dry. ? Change your underwear every day. ? Change out of wet bathing suits as soon as possible. ? After bathing, use a separate towel to dry your groin area thoroughly and gently. Using a separate towel will help prevent spreading the infection to other areas of your body.  Avoid hot baths and showers. Hot water can make itching worse.  Do not scratch the affected area. General instructions  Take and apply over-the-counter and prescription medicines only as told by your health care provider.  Do not share towels, clothing, or personal items with other people.  Wash your hands often with soap and water, especially after touching your groin area. If soap and  water are not available, use alcohol-based hand sanitizer. Contact a health care provider if:  Your rash: ? Gets worse or does not get better after 2 weeks of treatment. ? Spreads. ? Returns after treatment is finished.  You have any of the following: ? A fever. ? New or worsening redness, swelling, or pain around your rash. ? Fluid, blood, or pus coming from your rash. Summary  Jock itch (tinea cruris) is a fungal infection of the skin in the groin area.  The fungus can be spread by sharing clothing or by touching a fungus infection elsewhere on your body and then touching your groin area.  Treatment may include antifungal medicine and lifestyle changes, such as keeping the area clean and dry. This information is not intended to replace advice given to you by your health care provider. Make sure you discuss any questions you have with your health care provider. Document Released: 05/31/2002 Document Revised: 05/21/2017 Document Reviewed: 05/21/2017 Elsevier Interactive Patient Education  2019 [place].  Eczema Eczema is a broad term for a group of skin conditions that cause skin to become rough and inflamed. Each type of eczema has different triggers, symptoms, and treatments. Eczema of any type is usually itchy and symptoms range from mild to severe. Eczema and its symptoms are not spread from person to person (are not contagious). It can appear on different parts of the body at different times. Your eczema may not look the same as someone else's eczema. What are the types of eczema? Atopic dermatitis This is a long-term (chronic) skin disease that keeps coming back (recurring). Usual symptoms are dry skin and small, solid pimples that may swell and leak fluid (weep). Contact dermatitis  This happens when something irritates the skin and causes a rash. The irritation can come from substances that you are allergic to (allergens), such as poison ivy, chemicals, or medicines that  were applied to your skin. Dyshidrotic eczema This is a form of eczema on the hands and feet. It shows up as very itchy, fluid-filled blisters. It can affect people of any age, but is more common before age 44. Hand eczema  This causes very itchy areas of skin on the palms and sides of the hands and fingers. This type of eczema is common in industrial jobs where you may be exposed to many different types of irritants. Lichen simplex chronicus This type of eczema occurs when a person constantly scratches one area of the body. Repeated scratching of the area leads to thickened skin (lichenification). Lichen simplex chronicus can occur along with other types of eczema. It is more common in adults, but may be seen in children as well. Nummular eczema This is a  common type of eczema. It has no known cause. It typically causes a red, circular, crusty lesion (plaque) that may be itchy. Scratching may become a habit and can cause bleeding. Nummular eczema occurs most often in people of middle-age or older. It most often affects the hands. Seborrheic dermatitis This is a common skin disease that mainly affects the scalp. It may also affect any oily areas of the body, such as the face, sides of nose, eyebrows, ears, eyelids, and chest. It is marked by small scaling and redness of the skin (erythema). This can affect people of all ages. In infants, this condition is known as Chartered certified accountant." Stasis dermatitis This is a common skin disease that usually appears on the legs and feet. It most often occurs in people who have a condition that prevents blood from being pumped through the veins in the legs (chronic venous insufficiency). Stasis dermatitis is a chronic condition that needs long-term management. How is eczema diagnosed? Your health care provider will examine your skin and review your medical history. He or she may also give you skin patch tests. These tests involve taking patches that contain possible  allergens and placing them on your back. He or she will then check in a few days to see if an allergic reaction occurred. What are the common treatments? Treatment for eczema is based on the type of eczema you have. Hydrocortisone steroid medicine can relieve itching quickly and help reduce inflammation. This medicine may be prescribed or obtained over-the-counter, depending on the strength of the medicine that is needed. Follow these instructions at home:  Take over-the-counter and prescription medicines only as told by your health care provider.  Use creams or ointments to moisturize your skin. Do not use lotions.  Learn what triggers or irritates your symptoms. Avoid these things.  Treat symptom flare-ups quickly.  Do not itch your skin. This can make your rash worse.  Keep all follow-up visits as told by your health care provider. This is important. Where to find more information  The American Academy of Dermatology: http://jones-macias.info/  The National Eczema Association: www.nationaleczema.org Contact a health care provider if:  You have serious itching, even with treatment.  You regularly scratch your skin until it bleeds.  Your rash looks different than usual.  Your skin is painful, swollen, or more red than usual.  You have a fever. Summary  There are eight general types of eczema. Each type has different triggers.  Eczema of any type causes itching that may range from mild to severe.  Treatment varies based on the type of eczema you have. Hydrocortisone steroid medicine can help with itching and inflammation.  Protecting your skin is the best way to prevent eczema. Use moisturizers and lotions. Avoid triggers and irritants, and treat flare-ups quickly. This information is not intended to replace advice given to you by your health care provider. Make sure you discuss any questions you have with your health care provider. Document Released: 10/24/2016 Document Revised:  10/24/2016 Document Reviewed: 10/24/2016 Elsevier Interactive Patient Education  2019 [employer].    How to Take Your Blood Pressure You can take your blood pressure at home with a machine. You may need to check your blood pressure at home:  To check if you have high blood pressure (hypertension).  To check your blood pressure over time.  To make sure your blood pressure medicine is working. Supplies needed: You will need a blood pressure machine, or monitor. You can buy one at a  drugstore or online. When choosing one:  Choose one with an arm cuff.  Choose one that wraps around your upper arm. Only one finger should fit between your arm and the cuff.  Do not choose one that measures your blood pressure from your wrist or finger. Your doctor can suggest a monitor. How to prepare Avoid these things for 30 minutes before checking your blood pressure:  Drinking caffeine.  Drinking alcohol.  Eating.  Smoking.  Exercising. Five minutes before checking your blood pressure:  Pee.  Sit in a dining chair. Avoid sitting in a soft couch or armchair.  Be quiet. Do not talk. How to take your blood pressure Follow the instructions that came with your machine. If you have a digital blood pressure monitor, these may be the instructions: 1. Sit up straight. 2. Place your feet on the floor. Do not cross your ankles or legs. 3. Rest your left arm at the level of your heart. You may rest it on a table, desk, or chair. 4. Pull up your shirt sleeve. 5. Wrap the blood pressure cuff around the upper part of your left arm. The cuff should be 1 inch (2.5 cm) above your elbow. It is best to wrap the cuff around bare skin. 6. Fit the cuff snugly around your arm. You should be able to place only one finger between the cuff and your arm. 7. Put the cord inside the groove of your elbow. 8. Press the power button. 9. Sit quietly while the cuff fills with air and loses air. 10. Write down the  numbers on the screen. 11. Wait 2-3 minutes and then repeat steps 1-10. What do the numbers mean? Two numbers make up your blood pressure. The first number is called systolic pressure. The second is called diastolic pressure. An example of a blood pressure reading is "120 over 80" (or 120/80). If you are an adult and do not have a medical condition, use this guide to find out if your blood pressure is normal: Normal  First number: below 120.  Second number: below 80. Elevated  First number: 120-129.  Second number: below 80. Hypertension stage 1  First number: 130-139.  Second number: 80-89. Hypertension stage 2  First number: 140 or above.  Second number: 35 or above. Your blood pressure is above normal even if only the top or bottom number is above normal. Follow these instructions at home:  Check your blood pressure as often as your doctor tells you to.  Take your monitor to your next doctor's appointment. Your doctor will: ? Make sure you are using it correctly. ? Make sure it is working right.  Make sure you understand what your blood pressure numbers should be.  Tell your doctor if your medicines are causing side effects. Contact a doctor if:  Your blood pressure keeps being high. Get help right away if:  Your first blood pressure number is higher than 180.  Your second blood pressure number is higher than 120. This information is not intended to replace advice given to you by your health care provider. Make sure you discuss any questions you have with your health care provider. Document Released: 05/23/2008 Document Revised: 05/08/2016 Document Reviewed: 11/17/2015 Elsevier Interactive Patient Education  2019 [place].  Type 2 Diabetes Mellitus, Self Care, Adult When you have type 2 diabetes (type 2 diabetes mellitus), you must make sure your blood sugar (glucose) stays in a healthy range. You can do this with:  Nutrition.  Exercise.  Lifestyle  changes.  Medicines or insulin, if needed.  Support from your doctors and others. How to stay aware of blood sugar   Check your blood sugar level every day, as often as told.  Have your A1c (hemoglobin A1c) level checked two or more times a year. Have it checked more often if your doctor tells you to. Your doctor will set personal treatment goals for you. Generally, you should have these blood sugar levels:  Before meals (preprandial): 80-130 mg/dL (4.4-7.2 mmol/L).  After meals (postprandial): below 180 mg/dL (10 mmol/L).  A1c level: less than 7%. How to manage high and low blood sugar Signs of high blood sugar High blood sugar is called hyperglycemia. Know the signs of high blood sugar. Signs may include:  Feeling: ? Thirsty. ? Hungry. ? Very tired.  Needing to pee (urinate) more than usual.  Blurry vision. Signs of low blood sugar Low blood sugar is called hypoglycemia. This is when blood sugar is at or below 70 mg/dL (3.9 mmol/L). Signs may include:  Feeling: ? Hungry. ? Worried or nervous (anxious). ? Sweaty and clammy. ? Confused. ? Dizzy. ? Sleepy. ? Sick to your stomach (nauseous).  Having: ? A fast heartbeat. ? A headache. ? A change in your vision. ? Jerky movements that you cannot control (seizure). ? Tingling or no feeling (numbness) around your mouth, lips, or tongue.  Having trouble with: ? Moving (coordination). ? Sleeping. ? Passing out (fainting). ? Getting upset easily (irritability). Treating low blood sugar To treat low blood sugar, eat or drink something sugary right away. If you can think clearly and swallow safely, follow the 15:15 rule:  Take 15 grams of a fast-acting carb (carbohydrate). Talk with your doctor about how much you should take.  Some fast-acting carbs are: ? Sugar tablets (glucose pills). Take 3-4 pills. ? 6-8 pieces of hard candy. ? 4-6 oz (120-150 mL) of fruit juice. ? 4-6 oz (120-150 mL) of regular (not diet)  soda. ? 1 Tbsp (15 mL) honey or sugar.  Check your blood sugar 15 minutes after you take the carb.  If your blood sugar is still at or below 70 mg/dL (3.9 mmol/L), take 15 grams of a carb again.  If your blood sugar does not go above 70 mg/dL (3.9 mmol/L) after 3 tries, get help right away.  After your blood sugar goes back to normal, eat a meal or a snack within 1 hour. Treating very low blood sugar If your blood sugar is at or below 54 mg/dL (3 mmol/L), you have very low blood sugar (severe hypoglycemia). This is an emergency. Do not wait to see if the symptoms will go away. Get medical help right away. Call your local emergency services (911 in the [place] If you have very low blood sugar and you cannot eat or drink, you may need a glucagon shot (injection). A family member or friend should learn how to check your blood sugar and how to give you a glucagon shot. Ask your doctor if you need to have a glucagon shot kit at home. Follow these instructions at home: Medicine  Take insulin and diabetes medicines as told.  If your doctor says you should take more or less insulin and medicines, do this exactly as told.  Do not run out of insulin or medicines. Having diabetes can raise your risk for other long-term conditions. These include heart disease and kidney disease. Your doctor may prescribe medicines to help you not have these problems. Food  Make healthy food choices. These include: ? Chicken, fish, egg whites, and beans. ? Oats, whole wheat, bulgur, brown rice, quinoa, and millet. ? Fresh fruits and vegetables. ? Low-fat dairy products. ? Nuts, avocado, olive oil, and canola oil.  Meet with a food specialist (dietitian). He or she can help you make an eating plan that is right for you.  Follow instructions from your doctor about what you cannot eat or drink.  Drink enough fluid to keep your pee (urine) pale yellow.  Keep track of carbs that you eat. Do this by reading food  labels and learning food serving sizes.  Follow your sick day plan when you cannot eat or drink normally. Make this plan with your doctor so it is ready to use. Activity  Exercise 3 or more times a week.  Do not go more than 2 days without exercising.  Talk with your doctor before you start a new exercise. Your doctor may need to tell you to change: ? How much insulin or medicines you take. ? How much food you eat. Lifestyle  Do not use any tobacco products. These include cigarettes, chewing tobacco, and e-cigarettes. If you need help quitting, ask your doctor.  Ask your doctor how much alcohol is safe for you.  Learn to deal with stress. If you need help with this, ask your doctor. Body care   Stay up to date with your shots (immunizations).  Have your eyes and feet checked by a doctor as often as told.  Check your skin and feet every day. Check for cuts, bruises, redness, blisters, or sores.  Brush your teeth and gums two times a day. Floss one or more times a day.  Go to the dentist one or more times every 6 months.  Stay at a healthy weight. General instructions  Take over-the-counter and prescription medicines only as told by your doctor.  Share your diabetes care plan with: ? Your work or school. ? People you live with.  Carry a card or wear jewelry that says you have diabetes.  Keep all follow-up visits as told by your doctor. This is important. Questions to ask your doctor  Do I need to meet with a diabetes educator?  Where can I find a support group for people with diabetes? Where to find more information To learn more about diabetes, visit:  American Diabetes Association: www.diabetes.org  American Association of Diabetes Educators: www.diabeteseducator.org Summary  When you have type 2 diabetes, you must make sure your blood sugar (glucose) stays in a healthy range.  Check your blood sugar every day, as often as told.  Having diabetes can raise  your risk for other conditions. Your doctor may prescribe medicines to help you not have these problems.  Keep all follow-up visits as told by your doctor. This is important. This information is not intended to replace advice given to you by your health care provider. Make sure you discuss any questions you have with your health care provider. Document Released: 10/02/2015 Document Revised: 12/01/2017 Document Reviewed: 07/14/2015 Elsevier Interactive Patient Education  [employer].       If you have lab work done today you will be contacted with your lab results within the next 2 weeks.  If you have not heard from [place] then please contact us. The fastest way to get your results is to register for My Chart.   IF you received an x-ray today, you will receive an invoice from [facility]. Please contact [facility] at [phone] with questions or concerns regarding your invoice.   IF you received labwork today, you will receive an invoice from [facility]. Please contact [facility] at [phone] with questions or concerns regarding your invoice.   Our billing staff will not be able to assist you with questions regarding bills from these companies.  You will be contacted with the lab results as soon as they are available. The fastest way to get your results is to activate your My Chart account. Instructions are located on the last page of this paperwork. If you have not heard from [place] regarding the results in 2 weeks, please contact this office.       Signed,   [name], MD [facility].  07/22/18 1:42 PM

## 2018-07-22 NOTE — Patient Instructions (Addendum)
See info below on checking blood pressures. Bring machine to next visit to check accuracy.   You can try taking the protonix every other day to see if that works as well.  If more heartburn then return to daily use.   Triamcinolone cream to the hand rash, but also use Eucerin or other hydrating lotion multiple times per day.   Clotrimazole cream to the groin rash.  Use until that rash is improved and continue for 1 additional week.  Please follow up in next 3-4 weeks to review other concerns from form today, and we can schedule a physical at a later date as well.  If there are any concerns which were not addressed today that you feel are of an acute or emergent nature, or acutely worsening prior to evaluation here, please proceed directly to the emergency room or call 911.  Return to the clinic or go to the nearest emergency room if any of your symptoms worsen or new symptoms occur.   Jock Itch Jock itch (tinea cruris) is an infection of the skin in the groin area. It is caused by a fungus, which is a type of germ that lives in dark, damp places. Jock itch causes an itchy rash in the groin and upper thigh area. It usually goes away in 2-3 weeks with treatment. What are the causes? The fungus that causes jock itch may be spread by:  Touching a fungus infection elsewhere on your body, such as athlete's foot, and then touching your groin area.  Sharing towels or clothing, such as socks or shoes, with someone who has a fungal infection. What increases the risk? Jock itch is most common in men and adolescent boys. You are also more likely to develop the condition if you:  Are in a hot, humid climate.  Wear tight-fitting clothing or wet bathing suits for long periods of time.  Play sports.  Are overweight.  Have diabetes.  Have a weakened immune system.  Sweat a lot. What are the signs or symptoms? Symptoms of jock itch may include:  A red, pink, or brown rash in the groin area.  Blisters may be present. The rash may spread to the thighs, anus, and buttocks.  Dry and scaly skin on or around the rash.  Itchiness. How is this diagnosed? In most cases, your health care provider can make the diagnosis by looking at your rash. In some cases, a sample of infected skin may be scraped off. This sample may be examined under a microscope (biopsy) or by trying to grow the fungus from the sample (culture). How is this treated? Treatment for this condition may include:  Antifungal medicine to kill the fungus. This may be a skin cream, ointment, or powder, or it may be a medicine that you take by mouth.  Skin cream or ointment to reduce itching.  Lifestyle changes, such as wearing looser clothing and caring for your skin. Follow these instructions at home: Skin care  Apply skin creams, ointments, or powders exactly as told by your health care provider.  Wear loose-fitting clothing that does not rub against your groin area. Men should wear boxer shorts or loose-fitting underwear.  Keep your groin area clean and dry. ? Change your underwear every day. ? Change out of wet bathing suits as soon as possible. ? After bathing, use a separate towel to dry your groin area thoroughly and gently. Using a separate towel will help prevent spreading the infection to other areas of your body.  Avoid hot baths and showers. Hot water can make itching worse.  Do not scratch the affected area. General instructions  Take and apply over-the-counter and prescription medicines only as told by your health care provider.  Do not share towels, clothing, or personal items with other people.  Wash your hands often with soap and water, especially after touching your groin area. If soap and water are not available, use alcohol-based hand sanitizer. Contact a health care provider if:  Your rash: ? Gets worse or does not get better after 2 weeks of treatment. ? Spreads. ? Returns after treatment  is finished.  You have any of the following: ? A fever. ? New or worsening redness, swelling, or pain around your rash. ? Fluid, blood, or pus coming from your rash. Summary  Jock itch (tinea cruris) is a fungal infection of the skin in the groin area.  The fungus can be spread by sharing clothing or by touching a fungus infection elsewhere on your body and then touching your groin area.  Treatment may include antifungal medicine and lifestyle changes, such as keeping the area clean and dry. This information is not intended to replace advice given to you by your health care provider. Make sure you discuss any questions you have with your health care provider. Document Released: 05/31/2002 Document Revised: 05/21/2017 Document Reviewed: 05/21/2017 Elsevier Interactive Patient Education  2019 [place].  Eczema Eczema is a broad term for a group of skin conditions that cause skin to become rough and inflamed. Each type of eczema has different triggers, symptoms, and treatments. Eczema of any type is usually itchy and symptoms range from mild to severe. Eczema and its symptoms are not spread from person to person (are not contagious). It can appear on different parts of the body at different times. Your eczema may not look the same as someone else's eczema. What are the types of eczema? Atopic dermatitis This is a long-term (chronic) skin disease that keeps coming back (recurring). Usual symptoms are dry skin and small, solid pimples that may swell and leak fluid (weep). Contact dermatitis  This happens when something irritates the skin and causes a rash. The irritation can come from substances that you are allergic to (allergens), such as poison ivy, chemicals, or medicines that were applied to your skin. Dyshidrotic eczema This is a form of eczema on the hands and feet. It shows up as very itchy, fluid-filled blisters. It can affect people of any age, but is more common before age  82. Hand eczema  This causes very itchy areas of skin on the palms and sides of the hands and fingers. This type of eczema is common in industrial jobs where you may be exposed to many different types of irritants. Lichen simplex chronicus This type of eczema occurs when a person constantly scratches one area of the body. Repeated scratching of the area leads to thickened skin (lichenification). Lichen simplex chronicus can occur along with other types of eczema. It is more common in adults, but may be seen in children as well. Nummular eczema This is a common type of eczema. It has no known cause. It typically causes a red, circular, crusty lesion (plaque) that may be itchy. Scratching may become a habit and can cause bleeding. Nummular eczema occurs most often in people of middle-age or older. It most often affects the hands. Seborrheic dermatitis This is a common skin disease that mainly affects the scalp. It may also affect any oily areas of  the body, such as the face, sides of nose, eyebrows, ears, eyelids, and chest. It is marked by small scaling and redness of the skin (erythema). This can affect people of all ages. In infants, this condition is known as Chartered certified accountant." Stasis dermatitis This is a common skin disease that usually appears on the legs and feet. It most often occurs in people who have a condition that prevents blood from being pumped through the veins in the legs (chronic venous insufficiency). Stasis dermatitis is a chronic condition that needs long-term management. How is eczema diagnosed? Your health care provider will examine your skin and review your medical history. He or she may also give you skin patch tests. These tests involve taking patches that contain possible allergens and placing them on your back. He or she will then check in a few days to see if an allergic reaction occurred. What are the common treatments? Treatment for eczema is based on the type of eczema you  have. Hydrocortisone steroid medicine can relieve itching quickly and help reduce inflammation. This medicine may be prescribed or obtained over-the-counter, depending on the strength of the medicine that is needed. Follow these instructions at home:  Take over-the-counter and prescription medicines only as told by your health care provider.  Use creams or ointments to moisturize your skin. Do not use lotions.  Learn what triggers or irritates your symptoms. Avoid these things.  Treat symptom flare-ups quickly.  Do not itch your skin. This can make your rash worse.  Keep all follow-up visits as told by your health care provider. This is important. Where to find more information  The American Academy of Dermatology: http://jones-macias.info/  The National Eczema Association: www.nationaleczema.org Contact a health care provider if:  You have serious itching, even with treatment.  You regularly scratch your skin until it bleeds.  Your rash looks different than usual.  Your skin is painful, swollen, or more red than usual.  You have a fever. Summary  There are eight general types of eczema. Each type has different triggers.  Eczema of any type causes itching that may range from mild to severe.  Treatment varies based on the type of eczema you have. Hydrocortisone steroid medicine can help with itching and inflammation.  Protecting your skin is the best way to prevent eczema. Use moisturizers and lotions. Avoid triggers and irritants, and treat flare-ups quickly. This information is not intended to replace advice given to you by your health care provider. Make sure you discuss any questions you have with your health care provider. Document Released: 10/24/2016 Document Revised: 10/24/2016 Document Reviewed: 10/24/2016 Elsevier Interactive Patient Education  2019 [employer].    How to Take Your Blood Pressure You can take your blood pressure at home with a machine. You may need to check  your blood pressure at home:  To check if you have high blood pressure (hypertension).  To check your blood pressure over time.  To make sure your blood pressure medicine is working. Supplies needed: You will need a blood pressure machine, or monitor. You can buy one at a drugstore or online. When choosing one:  Choose one with an arm cuff.  Choose one that wraps around your upper arm. Only one finger should fit between your arm and the cuff.  Do not choose one that measures your blood pressure from your wrist or finger. Your doctor can suggest a monitor. How to prepare Avoid these things for 30 minutes before checking your blood pressure:  Drinking caffeine.  Drinking alcohol.  Eating.  Smoking.  Exercising. Five minutes before checking your blood pressure:  Pee.  Sit in a dining chair. Avoid sitting in a soft couch or armchair.  Be quiet. Do not talk. How to take your blood pressure Follow the instructions that came with your machine. If you have a digital blood pressure monitor, these may be the instructions: 1. Sit up straight. 2. Place your feet on the floor. Do not cross your ankles or legs. 3. Rest your left arm at the level of your heart. You may rest it on a table, desk, or chair. 4. Pull up your shirt sleeve. 5. Wrap the blood pressure cuff around the upper part of your left arm. The cuff should be 1 inch (2.5 cm) above your elbow. It is best to wrap the cuff around bare skin. 6. Fit the cuff snugly around your arm. You should be able to place only one finger between the cuff and your arm. 7. Put the cord inside the groove of your elbow. 8. Press the power button. 9. Sit quietly while the cuff fills with air and loses air. 10. Write down the numbers on the screen. 11. Wait 2-3 minutes and then repeat steps 1-10. What do the numbers mean? Two numbers make up your blood pressure. The first number is called systolic pressure. The second is called diastolic  pressure. An example of a blood pressure reading is "120 over 80" (or 120/80). If you are an adult and do not have a medical condition, use this guide to find out if your blood pressure is normal: Normal  First number: below 120.  Second number: below 80. Elevated  First number: 120-129.  Second number: below 80. Hypertension stage 1  First number: 130-139.  Second number: 80-89. Hypertension stage 2  First number: 140 or above.  Second number: 67 or above. Your blood pressure is above normal even if only the top or bottom number is above normal. Follow these instructions at home:  Check your blood pressure as often as your doctor tells you to.  Take your monitor to your next doctor's appointment. Your doctor will: ? Make sure you are using it correctly. ? Make sure it is working right.  Make sure you understand what your blood pressure numbers should be.  Tell your doctor if your medicines are causing side effects. Contact a doctor if:  Your blood pressure keeps being high. Get help right away if:  Your first blood pressure number is higher than 180.  Your second blood pressure number is higher than 120. This information is not intended to replace advice given to you by your health care provider. Make sure you discuss any questions you have with your health care provider. Document Released: 05/23/2008 Document Revised: 05/08/2016 Document Reviewed: 11/17/2015 Elsevier Interactive Patient Education  2019 [place].  Type 2 Diabetes Mellitus, Self Care, Adult When you have type 2 diabetes (type 2 diabetes mellitus), you must make sure your blood sugar (glucose) stays in a healthy range. You can do this with:  Nutrition.  Exercise.  Lifestyle changes.  Medicines or insulin, if needed.  Support from your doctors and others. How to stay aware of blood sugar   Check your blood sugar level every day, as often as told.  Have your A1c (hemoglobin A1c) level  checked two or more times a year. Have it checked more often if your doctor tells you to. Your doctor will set personal treatment goals  for you. Generally, you should have these blood sugar levels:  Before meals (preprandial): 80-130 mg/dL (4.4-7.2 mmol/L).  After meals (postprandial): below 180 mg/dL (10 mmol/L).  A1c level: less than 7%. How to manage high and low blood sugar Signs of high blood sugar High blood sugar is called hyperglycemia. Know the signs of high blood sugar. Signs may include:  Feeling: ? Thirsty. ? Hungry. ? Very tired.  Needing to pee (urinate) more than usual.  Blurry vision. Signs of low blood sugar Low blood sugar is called hypoglycemia. This is when blood sugar is at or below 70 mg/dL (3.9 mmol/L). Signs may include:  Feeling: ? Hungry. ? Worried or nervous (anxious). ? Sweaty and clammy. ? Confused. ? Dizzy. ? Sleepy. ? Sick to your stomach (nauseous).  Having: ? A fast heartbeat. ? A headache. ? A change in your vision. ? Jerky movements that you cannot control (seizure). ? Tingling or no feeling (numbness) around your mouth, lips, or tongue.  Having trouble with: ? Moving (coordination). ? Sleeping. ? Passing out (fainting). ? Getting upset easily (irritability). Treating low blood sugar To treat low blood sugar, eat or drink something sugary right away. If you can think clearly and swallow safely, follow the 15:15 rule:  Take 15 grams of a fast-acting carb (carbohydrate). Talk with your doctor about how much you should take.  Some fast-acting carbs are: ? Sugar tablets (glucose pills). Take 3-4 pills. ? 6-8 pieces of hard candy. ? 4-6 oz (120-150 mL) of fruit juice. ? 4-6 oz (120-150 mL) of regular (not diet) soda. ? 1 Tbsp (15 mL) honey or sugar.  Check your blood sugar 15 minutes after you take the carb.  If your blood sugar is still at or below 70 mg/dL (3.9 mmol/L), take 15 grams of a carb again.  If your blood sugar does  not go above 70 mg/dL (3.9 mmol/L) after 3 tries, get help right away.  After your blood sugar goes back to normal, eat a meal or a snack within 1 hour. Treating very low blood sugar If your blood sugar is at or below 54 mg/dL (3 mmol/L), you have very low blood sugar (severe hypoglycemia). This is an emergency. Do not wait to see if the symptoms will go away. Get medical help right away. Call your local emergency services (911 in the [place] If you have very low blood sugar and you cannot eat or drink, you may need a glucagon shot (injection). A family member or friend should learn how to check your blood sugar and how to give you a glucagon shot. Ask your doctor if you need to have a glucagon shot kit at home. Follow these instructions at home: Medicine  Take insulin and diabetes medicines as told.  If your doctor says you should take more or less insulin and medicines, do this exactly as told.  Do not run out of insulin or medicines. Having diabetes can raise your risk for other long-term conditions. These include heart disease and kidney disease. Your doctor may prescribe medicines to help you not have these problems. Food   Make healthy food choices. These include: ? Chicken, fish, egg whites, and beans. ? Oats, whole wheat, bulgur, brown rice, quinoa, and millet. ? Fresh fruits and vegetables. ? Low-fat dairy products. ? Nuts, avocado, olive oil, and canola oil.  Meet with a food specialist (dietitian). He or she can help you make an eating plan that is right for you.  Follow instructions from  your doctor about what you cannot eat or drink.  Drink enough fluid to keep your pee (urine) pale yellow.  Keep track of carbs that you eat. Do this by reading food labels and learning food serving sizes.  Follow your sick day plan when you cannot eat or drink normally. Make this plan with your doctor so it is ready to use. Activity  Exercise 3 or more times a week.  Do not go more  than 2 days without exercising.  Talk with your doctor before you start a new exercise. Your doctor may need to tell you to change: ? How much insulin or medicines you take. ? How much food you eat. Lifestyle  Do not use any tobacco products. These include cigarettes, chewing tobacco, and e-cigarettes. If you need help quitting, ask your doctor.  Ask your doctor how much alcohol is safe for you.  Learn to deal with stress. If you need help with this, ask your doctor. Body care   Stay up to date with your shots (immunizations).  Have your eyes and feet checked by a doctor as often as told.  Check your skin and feet every day. Check for cuts, bruises, redness, blisters, or sores.  Brush your teeth and gums two times a day. Floss one or more times a day.  Go to the dentist one or more times every 6 months.  Stay at a healthy weight. General instructions  Take over-the-counter and prescription medicines only as told by your doctor.  Share your diabetes care plan with: ? Your work or school. ? People you live with.  Carry a card or wear jewelry that says you have diabetes.  Keep all follow-up visits as told by your doctor. This is important. Questions to ask your doctor  Do I need to meet with a diabetes educator?  Where can I find a support group for people with diabetes? Where to find more information To learn more about diabetes, visit:  American Diabetes Association: www.diabetes.org  American Association of Diabetes Educators: www.diabeteseducator.org Summary  When you have type 2 diabetes, you must make sure your blood sugar (glucose) stays in a healthy range.  Check your blood sugar every day, as often as told.  Having diabetes can raise your risk for other conditions. Your doctor may prescribe medicines to help you not have these problems.  Keep all follow-up visits as told by your doctor. This is important. This information is not intended to replace  advice given to you by your health care provider. Make sure you discuss any questions you have with your health care provider. Document Released: 10/02/2015 Document Revised: 12/01/2017 Document Reviewed: 07/14/2015 Elsevier Interactive Patient Education  [employer].       If you have lab work done today you will be contacted with your lab results within the next 2 weeks.  If you have not heard from [place] then please contact us. The fastest way to get your results is to register for My Chart.   IF you received an x-ray today, you will receive an invoice from [facility]. Please contact [facility] at [phone] with questions or concerns regarding your invoice.   IF you received labwork today, you will receive an invoice from [facility]. Please contact [facility] at [phone] with questions or concerns regarding your invoice.   Our billing staff will not be able to assist you with questions regarding bills from these companies.  You will be contacted with the lab results as soon  as they are available. The fastest way to get your results is to activate your My Chart account. Instructions are located on the last page of this paperwork. If you have not heard from [place] regarding the results in 2 weeks, please contact this office.

## 2018-07-23 LAB — COMPREHENSIVE METABOLIC PANEL
A/G RATIO: 1.9 (ref 1.2–2.2)
ALK PHOS: 101 IU/L (ref 39–117)
ALT: 14 IU/L (ref 0–44)
AST: 16 IU/L (ref 0–40)
Albumin: 4.5 g/dL (ref 3.8–4.8)
BUN/Creatinine Ratio: 14 (ref 10–24)
BUN: 10 mg/dL (ref 8–27)
Bilirubin Total: 0.3 mg/dL (ref 0.0–1.2)
CO2: 26 mmol/L (ref 20–29)
Calcium: 8.7 mg/dL (ref 8.6–10.2)
Chloride: 100 mmol/L (ref 96–106)
Creatinine, Ser: 0.7 mg/dL — ABNORMAL LOW (ref 0.76–1.27)
GFR calc Af Amer: 118 mL/min/{1.73_m2} (ref >59)
GFR calc non Af Amer: 102 mL/min/{1.73_m2} (ref >59)
GLOBULIN, TOTAL: 2.4 g/dL (ref 1.5–4.5)
Glucose: 135 mg/dL — ABNORMAL HIGH (ref 65–99)
POTASSIUM: 4 mmol/L (ref 3.5–5.2)
SODIUM: 142 mmol/L (ref 134–144)
Total Protein: 6.9 g/dL (ref 6.0–8.5)

## 2018-07-23 LAB — LIPID PANEL
CHOL/HDL RATIO: 2.8 ratio (ref 0.0–5.0)
CHOLESTEROL TOTAL: 135 mg/dL (ref 100–199)
HDL: 48 mg/dL (ref >39)
LDL CALC: 75 mg/dL (ref 0–99)
Triglycerides: 58 mg/dL (ref 0–149)
VLDL CHOLESTEROL CAL: 12 mg/dL (ref 5–40)

## 2018-07-23 LAB — HEPATITIS C ANTIBODY: Hep C Virus Ab: <0.1 s/co ratio (ref 0.0–0.9)

## 2018-07-23 LAB — HEMOGLOBIN A1C
Est. average glucose Bld gHb Est-mCnc: 177 mg/dL
Hgb A1c MFr Bld: 7.8 % — ABNORMAL HIGH (ref 4.8–5.6)

## 2018-07-23 LAB — HIV ANTIBODY (ROUTINE TESTING W REFLEX): HIV Screen 4th Generation wRfx: NONREACTIVE

## 2018-07-23 LAB — MICROALBUMIN / CREATININE URINE RATIO
Creatinine, Urine: 196.5 mg/dL
MICROALB/CREAT RATIO: 14 mg/g{creat} (ref 0–29)
Microalbumin, Urine: 28.3 ug/mL

## 2018-09-13 ENCOUNTER — Other Ambulatory Visit: Payer: Self-pay | Admitting: Family Medicine

## 2018-09-13 DIAGNOSIS — IMO0001 Reserved for inherently not codable concepts without codable children: Secondary | ICD-10-CM

## 2018-09-13 DIAGNOSIS — E1165 Type 2 diabetes mellitus with hyperglycemia: Principal | ICD-10-CM

## 2018-09-14 NOTE — Telephone Encounter (Signed)
Requested medication (s) are due for refill today: yes  Requested medication (s) are on the active medication list: yes  Last refill:  04/28/2017  Future visit scheduled: no  Notes to clinic:  Diabetes- biguanides failed. Prescription expired 04/28/2018.  Requested Prescriptions  Pending Prescriptions Disp Refills   metFORMIN (GLUCOPHAGE) 500 MG tablet [Pharmacy Med Name: metFORMIN HCl 500 MG Oral Tablet] 360 tablet 0    Sig: Take 2 tablets by mouth twice daily     Endocrinology:  Diabetes - Biguanides Failed - 09/13/2018  2:17 PM      Failed - Cr in normal range and within 360 days    Creat  Date Value Ref Range Status  02/08/2016 0.69 (L) 0.70 - 1.33 mg/dL Final    Comment:      For patients > or = 62 years of age: The upper reference limit for Creatinine is approximately 13% higher for people identified as African-American.      Creatinine, Ser  Date Value Ref Range Status  07/22/2018 0.70 (L) 0.76 - 1.27 mg/dL Final         Passed - HBA1C is between 0 and 7.9 and within 180 days    Hgb A1c MFr Bld  Date Value Ref Range Status  07/22/2018 7.8 (H) 4.8 - 5.6 % Final    Comment:             Prediabetes: 5.7 - 6.4          Diabetes: >6.4          Glycemic control for adults with diabetes: <7.0          Passed - eGFR in normal range and within 360 days    GFR, Est African American  Date Value Ref Range Status  02/08/2016 >89 >=60 mL/min Final   GFR calc Af Amer  Date Value Ref Range Status  07/22/2018 118 >59 mL/min/1.73 Final   GFR, Est Non African American  Date Value Ref Range Status  02/08/2016 >89 >=60 mL/min Final   GFR calc non Af Amer  Date Value Ref Range Status  07/22/2018 102 >59 mL/min/1.73 Final         Passed - Valid encounter within last 6 months    Recent Outpatient Visits          1 month ago Hyperlipidemia, unspecified hyperlipidemia type   [facility], Ranell Patrick, MD   1 year ago Laceration of skin of left eyelid,  subsequent encounter   [facility], Ranell Patrick, MD   1 year ago Type 2 diabetes mellitus without complication, without long-term current use of insulin [facility])   [facility], Ranell Patrick, MD   1 year ago Acute flank pain   [facility], Ranell Patrick, MD   1 year ago Right testicular pain   [facility], Ranell Patrick, MD

## 2018-09-15 NOTE — Telephone Encounter (Signed)
Discussed in January 29 visit.  Refilled.

## 2018-10-28 ENCOUNTER — Telehealth: Payer: BLUE CROSS/BLUE SHIELD | Admitting: Family Medicine

## 2018-12-02 ENCOUNTER — Other Ambulatory Visit: Payer: Self-pay | Admitting: Family Medicine

## 2018-12-02 DIAGNOSIS — IMO0001 Reserved for inherently not codable concepts without codable children: Secondary | ICD-10-CM

## 2018-12-26 ENCOUNTER — Other Ambulatory Visit: Payer: Self-pay | Admitting: Family Medicine

## 2018-12-26 DIAGNOSIS — E785 Hyperlipidemia, unspecified: Secondary | ICD-10-CM

## 2018-12-26 DIAGNOSIS — K219 Gastro-esophageal reflux disease without esophagitis: Secondary | ICD-10-CM

## 2018-12-26 NOTE — Telephone Encounter (Signed)
Requested Prescriptions  Pending Prescriptions Disp Refills  . pantoprazole (PROTONIX) 40 MG tablet [Pharmacy Med Name: Pantoprazole Sodium 40 MG Oral Tablet Delayed Release] 90 tablet 1    Sig: TAKE 1 TABLET BY MOUTH ONCE DAILY *MUST  KEEPS  1.20.20  APPOINTMENT  FOR  MORE  REFILLS*     Gastroenterology: Proton Pump Inhibitors Passed - 12/26/2018  2:07 PM      Passed - Valid encounter within last 12 months    Recent Outpatient Visits          5 months ago Hyperlipidemia, unspecified hyperlipidemia type   [facility], Ranell Patrick, MD   1 year ago Laceration of skin of left eyelid, subsequent encounter   [facility], Ranell Patrick, MD   1 year ago Type 2 diabetes mellitus without complication, without long-term current use of insulin [facility])   [facility], Ranell Patrick, MD   1 year ago Acute flank pain   [facility], Ranell Patrick, MD   2 years ago Right testicular pain   [facility], Ranell Patrick, MD

## 2018-12-26 NOTE — Telephone Encounter (Signed)
Requested Prescriptions  Pending Prescriptions Disp Refills  . atorvastatin (LIPITOR) 10 MG tablet [Pharmacy Med Name: Atorvastatin Calcium 10 MG Oral Tablet] 90 tablet 0    Sig: Take 1 tablet by mouth once daily     Cardiovascular:  Antilipid - Statins Passed - 12/26/2018  1:24 PM      Passed - Total Cholesterol in normal range and within 360 days    Cholesterol, Total  Date Value Ref Range Status  07/22/2018 135 100 - 199 mg/dL Final         Passed - LDL in normal range and within 360 days    LDL Calculated  Date Value Ref Range Status  07/22/2018 75 0 - 99 mg/dL Final         Passed - HDL in normal range and within 360 days    HDL  Date Value Ref Range Status  07/22/2018 48 >39 mg/dL Final         Passed - Triglycerides in normal range and within 360 days    Triglycerides  Date Value Ref Range Status  07/22/2018 58 0 - 149 mg/dL Final         Passed - Patient is not pregnant      Passed - Valid encounter within last 12 months    Recent Outpatient Visits          5 months ago Hyperlipidemia, unspecified hyperlipidemia type   [facility], Ranell Patrick, MD   1 year ago Laceration of skin of left eyelid, subsequent encounter   [facility], Ranell Patrick, MD   1 year ago Type 2 diabetes mellitus without complication, without long-term current use of insulin [facility])   [facility], Ranell Patrick, MD   1 year ago Acute flank pain   [facility] [place], MD   2 years ago Right testicular pain   [facility], Ranell Patrick, MD

## 2019-02-23 ENCOUNTER — Telehealth: Payer: Self-pay | Admitting: Family Medicine

## 2019-02-23 NOTE — Telephone Encounter (Signed)
Pt request refill  metFORMIN (GLUCOPHAGE) 500 MG tablet   [place], [place] [phone] (Phone) [phone] (Fax)    Advised pt this not due until 03/04/19.  He states he knows but wants a refill anyway.

## 2019-02-24 ENCOUNTER — Other Ambulatory Visit: Payer: Self-pay

## 2019-02-24 DIAGNOSIS — IMO0001 Reserved for inherently not codable concepts without codable children: Secondary | ICD-10-CM

## 2019-02-24 MED ORDER — METFORMIN HCL 500 MG PO TABS: 1000.0000 mg | ORAL_TABLET | Freq: Two times a day (BID) | ORAL | 0 refills | Status: DC

## 2019-02-24 NOTE — Telephone Encounter (Signed)
Rx sent to pharmacy   

## 2019-03-20 ENCOUNTER — Other Ambulatory Visit: Payer: Self-pay | Admitting: Family Medicine

## 2019-03-20 DIAGNOSIS — E785 Hyperlipidemia, unspecified: Secondary | ICD-10-CM

## 2019-03-20 DIAGNOSIS — IMO0001 Reserved for inherently not codable concepts without codable children: Secondary | ICD-10-CM

## 2019-03-21 NOTE — Telephone Encounter (Signed)
Requested medication (s) are due for refill today: yes  Requested medication (s) are on the active medication list: yes  Last refill:  02/19/2019  Future visit scheduled: no  Notes to clinic:  Review for refill LOV-07/22/2018   Requested Prescriptions  Pending Prescriptions Disp Refills   metFORMIN (GLUCOPHAGE) 500 MG tablet [Pharmacy Med Name: metFORMIN HCl 500 MG Oral Tablet] 360 tablet 0    Sig: Take 2 tablets by mouth twice daily     Endocrinology:  Diabetes - Biguanides Failed - 03/20/2019 11:52 AM      Failed - Cr in normal range and within 360 days    Creat  Date Value Ref Range Status  02/08/2016 0.69 (L) 0.70 - 1.33 mg/dL Final    Comment:      For patients > or = 62 years of age: The upper reference limit for Creatinine is approximately 13% higher for people identified as African-American.      Creatinine, Ser  Date Value Ref Range Status  07/22/2018 0.70 (L) 0.76 - 1.27 mg/dL Final         Failed - HBA1C is between 0 and 7.9 and within 180 days    Hgb A1c MFr Bld  Date Value Ref Range Status  07/22/2018 7.8 (H) 4.8 - 5.6 % Final    Comment:             Prediabetes: 5.7 - 6.4          Diabetes: >6.4          Glycemic control for adults with diabetes: <7.0          Failed - Valid encounter within last 6 months    Recent Outpatient Visits          8 months ago Hyperlipidemia, unspecified hyperlipidemia type   [facility], Ranell Patrick, MD   1 year ago Laceration of skin of left eyelid, subsequent encounter   [facility], Ranell Patrick, MD   1 year ago Type 2 diabetes mellitus without complication, without long-term current use of insulin [facility])   [facility], Ranell Patrick, MD   2 years ago Acute flank pain   [facility], Ranell Patrick, MD   2 years ago Right testicular pain   [facility], Ranell Patrick, MD             Passed - eGFR in normal range and within 360 days    GFR,  Est African American  Date Value Ref Range Status  02/08/2016 >89 >=60 mL/min Final   GFR calc Af Amer  Date Value Ref Range Status  07/22/2018 118 >59 mL/min/1.73 Final   GFR, Est Non African American  Date Value Ref Range Status  02/08/2016 >89 >=60 mL/min Final   GFR calc non Af Amer  Date Value Ref Range Status  07/22/2018 102 >59 mL/min/1.73 Final          atorvastatin (LIPITOR) 10 MG tablet [Pharmacy Med Name: Atorvastatin Calcium 10 MG Oral Tablet] 90 tablet 0    Sig: Take 1 tablet by mouth once daily     Cardiovascular:  Antilipid - Statins Passed - 03/20/2019 11:52 AM      Passed - Total Cholesterol in normal range and within 360 days    Cholesterol, Total  Date Value Ref Range Status  07/22/2018 135 100 - 199 mg/dL Final         Passed -  LDL in normal range and within 360 days    LDL Calculated  Date Value Ref Range Status  07/22/2018 75 0 - 99 mg/dL Final         Passed - HDL in normal range and within 360 days    HDL  Date Value Ref Range Status  07/22/2018 48 >39 mg/dL Final         Passed - Triglycerides in normal range and within 360 days    Triglycerides  Date Value Ref Range Status  07/22/2018 58 0 - 149 mg/dL Final         Passed - Patient is not pregnant      Passed - Valid encounter within last 12 months    Recent Outpatient Visits          8 months ago Hyperlipidemia, unspecified hyperlipidemia type   [facility], Ranell Patrick, MD   1 year ago Laceration of skin of left eyelid, subsequent encounter   [facility], Ranell Patrick, MD   1 year ago Type 2 diabetes mellitus without complication, without long-term current use of insulin [facility])   [facility], Ranell Patrick, MD   2 years ago Acute flank pain   [facility] [place], MD   2 years ago Right testicular pain   [facility], Ranell Patrick, MD

## 2019-03-23 NOTE — Telephone Encounter (Signed)
Need a follow up appointment for any further refills

## 2019-03-24 NOTE — Telephone Encounter (Signed)
Spoke with pt and he did not schedule appt due to losing his insurance. Pt states he is leaving to [place] next month and will not be back until end of November. He will cb when he has insurance again to schedule appt

## 2019-05-22 ENCOUNTER — Other Ambulatory Visit: Payer: Self-pay | Admitting: Family Medicine

## 2019-05-22 DIAGNOSIS — I10 Essential (primary) hypertension: Secondary | ICD-10-CM

## 2019-05-22 NOTE — Telephone Encounter (Signed)
Forwarding medication refill request to the clinical pool for review. 

## 2019-05-28 NOTE — Telephone Encounter (Signed)
Refilled medication for 30 days, 0 refills. Pt needs OV and lab work for additional refills.

## 2019-06-11 NOTE — Telephone Encounter (Signed)
Spoke with pt and scheduled med refill appt

## 2019-07-03 ENCOUNTER — Other Ambulatory Visit: Payer: Self-pay | Admitting: Family Medicine

## 2019-07-03 DIAGNOSIS — I10 Essential (primary) hypertension: Secondary | ICD-10-CM

## 2019-07-03 NOTE — Telephone Encounter (Signed)
Requested medication (s) are due for refill today: yes  Requested medication (s) are on the active medication list:  yes  Last refill:  05/28/2019  Future visit scheduled: yes  Notes to clinic: no valid encounter within last 6 months Patient has appointment on 07/09/2019   Requested Prescriptions  Pending Prescriptions Disp Refills   lisinopril-hydrochlorothiazide (ZESTORETIC) 20-25 MG tablet [Pharmacy Med Name: Lisinopril-hydroCHLOROthiazide 20-25 MG Oral Tablet] 30 tablet 0    Sig: Take 1 tablet by mouth once daily      Cardiovascular:  ACEI + Diuretic Combos Failed - 07/03/2019  1:36 PM      Failed - Na in normal range and within 180 days    Sodium  Date Value Ref Range Status  07/22/2018 142 134 - 144 mmol/L Final          Failed - K in normal range and within 180 days    Potassium  Date Value Ref Range Status  07/22/2018 4.0 3.5 - 5.2 mmol/L Final          Failed - Cr in normal range and within 180 days    Creat  Date Value Ref Range Status  02/08/2016 0.69 (L) 0.70 - 1.33 mg/dL Final    Comment:      For patients > or = 63 years of age: The upper reference limit for Creatinine is approximately 13% higher for people identified as African-American.      Creatinine, Ser  Date Value Ref Range Status  07/22/2018 0.70 (L) 0.76 - 1.27 mg/dL Final          Failed - Ca in normal range and within 180 days    Calcium  Date Value Ref Range Status  07/22/2018 8.7 8.6 - 10.2 mg/dL Final   Calcium, Ion  Date Value Ref Range Status  02/17/2012 1.19 1.12 - 1.23 mmol/L Final          Failed - Valid encounter within last 6 months    Recent Outpatient Visits           11 months ago Hyperlipidemia, unspecified hyperlipidemia type   [facility], Ranell Patrick, MD   2 years ago Laceration of skin of left eyelid, subsequent encounter   [facility], Ranell Patrick, MD   2 years ago Type 2 diabetes mellitus without complication, without  long-term current use of insulin [facility])   [facility], Ranell Patrick, MD   2 years ago Acute flank pain   [facility] [place], MD   2 years ago Right testicular pain   [facility], Ranell Patrick, MD       Future Appointments             In 6 days [name], MD [facility] [place], [place] - Patient is not pregnant      Passed - Last BP in normal range    BP Readings from Last 1 Encounters:  07/22/18 128/76

## 2019-07-06 ENCOUNTER — Encounter: Payer: Self-pay | Admitting: Family Medicine

## 2019-07-06 ENCOUNTER — Ambulatory Visit (INDEPENDENT_AMBULATORY_CARE_PROVIDER_SITE_OTHER): Payer: 59 | Admitting: Family Medicine

## 2019-07-06 ENCOUNTER — Other Ambulatory Visit: Payer: Self-pay

## 2019-07-06 ENCOUNTER — Ambulatory Visit (INDEPENDENT_AMBULATORY_CARE_PROVIDER_SITE_OTHER): Payer: 59

## 2019-07-06 VITALS — BP 126/78 | HR 85 | Temp 98.0°F | Ht 73.0 in | Wt 204.2 lb

## 2019-07-06 DIAGNOSIS — E785 Hyperlipidemia, unspecified: Secondary | ICD-10-CM

## 2019-07-06 DIAGNOSIS — E1140 Type 2 diabetes mellitus with diabetic neuropathy, unspecified: Secondary | ICD-10-CM

## 2019-07-06 DIAGNOSIS — R0789 Other chest pain: Secondary | ICD-10-CM

## 2019-07-06 DIAGNOSIS — Z515 Encounter for palliative care: Secondary | ICD-10-CM | POA: Diagnosis not present

## 2019-07-06 LAB — POCT CBC
Granulocyte percent: 61.9 %G (ref 37–80)
HCT, POC: 36 % (ref 29–41)
Hemoglobin: 11.9 g/dL (ref 11–14.6)
Lymph, poc: 1.4 (ref 0.6–3.4)
MCH, POC: 26.3 pg — AB (ref 27–31.2)
MCHC: 33.2 g/dL (ref 31.8–35.4)
MCV: 79.2 fL (ref 76–111)
MID (cbc): 0.5 (ref 0–0.9)
MPV: 6.8 fL (ref 0–99.8)
POC Granulocyte: 3 (ref 2–6.9)
POC LYMPH PERCENT: 28.2 %L (ref 10–50)
POC MID %: 9.9 %M (ref 0–12)
Platelet Count, POC: 245 10*3/uL (ref 142–424)
RBC: 4.55 M/uL — AB (ref 4.69–6.13)
RDW, POC: 14.8 %
WBC: 4.9 10*3/uL (ref 4.6–10.2)

## 2019-07-06 LAB — POCT GLYCOSYLATED HEMOGLOBIN (HGB A1C): Hemoglobin A1C: 8.8 % — AB (ref 4.0–5.6)

## 2019-07-06 LAB — GLUCOSE, POCT (MANUAL RESULT ENTRY): POC Glucose: 139 mg/dl — AB (ref 70–99)

## 2019-07-06 LAB — TROPONIN I: Troponin I: <0.01 ng/mL (ref 0.00–0.04)

## 2019-07-06 MED ORDER — HYDROCODONE-ACETAMINOPHEN 5-325 MG PO TABS: 1.0000 | ORAL_TABLET | Freq: Four times a day (QID) | ORAL | 0 refills | Status: DC | PRN

## 2019-07-06 NOTE — Progress Notes (Addendum)
Patient ID: Brendan Gonzales, male    DOB: 1956-04-17  Age: 64 y.o. MRN: [id number]  Chief Complaint  Patient presents with  . Chest Pain    Pt stated that it started yesterday and got worse that night     Subjective:   64 year old man who comes in with history of having had intermittent sharp right sternal chest pains yesterday.  Initially they were short, but then became a more continuous pain.  It hurting through the night.  When he got up to go to the bathroom it was hurting.  He points to the right upper chest.  No nausea or vomiting or diaphoresis.  He said he has for the last month taken some little gasping breaths off and on, but not short of breath.  He is diabetic and hyperlipidemic.  Current allergies, medications, problem list, past/family and social histories reviewed.  Objective:  There were no vitals taken for this visit.  No major acute distress.  Alert and oriented.  Says the pain is 6 or 7 but he looks comfortable.  Chest clear.  Heart rate without murmurs.  Is very tender in the right chest wall, just right of the sternum.  Abdomen soft with mild to moderate epigastric tenderness.  No ankle edema but his legs are generally tender.  He says it is from his diabetes.  Assessment & Plan:   Assessment: 1. Atypical chest pain   2. Other chest pain   3. Acute chest wall pain   4. Type 2 diabetes, controlled, with neuropathy ([place])   5. Hyperlipidemia, unspecified hyperlipidemia type       Plan: I spoke with Dr. Einar Gip and arrange for him to be seen at their office tomorrow.  I sent him the EKG and he agrees that it looks stable.  See detailed instructions  Orders Placed This Encounter  Procedures  . DG Chest 2 View    Standing Status:   Future    Number of Occurrences:   1    Standing Expiration Date:   07/05/2020    Order Specific Question:   Reason for Exam (SYMPTOM  OR DIAGNOSIS REQUIRED)    Answer:   right anterior chest wall pain    Order Specific Question:    Preferred imaging location?    Answer:   External  . Troponin I  . POCT glucose  . POCT Hb A1C  . POCT CBC  . EKG 12-Lead    No orders of the defined types were placed in this encounter.   Diabetes is not in good control.  EKG has no acute changes.  Dr. Einar Gip also reviewed it.  I sent a photo of it on the phone to him.  Troponin normal--see bottom of note   Patient Instructions    We are making you an appointment to see Dr. Einar Gip or one of his associates tomorrow at 9:45 AM.  Take all your medicines to the office.  [facility] cardiology, [address] [place].  The office number is [phone] should you have difficulty finding it.  Make sure you are there before 9:45.  I have checked a blood test regarding your heart.  If the lab calls me tonight an abnormal report, I would need to call you and send you to the emergency room.  I would like you to take over-the-counter Aleve 2 pills twice daily.  Take it with food for the chest wall pain.  You can continue this for a few days, but in  a diabetic we do not want you taking it long-term.  In addition to this, you can take Norco (hydrocodone) 1 every 6 hours if needed for pain.  In the event of worse chest pain, shortness of breath, dizziness or passing out, pain going down your left arm, nausea and vomiting, breaking out in sweat, or other items of concern call 911 and get taken to the emergency room.  It is important that you follow-up with your regular doctor, Dr. [name], in the near future.  I believe this is an atypical chest pain from the chest wall and not from the heart, but I do want the heart doctor to check it out tomorrow to be certain there are no other concerns.     If you have lab work done today you will be contacted with your lab results within the next 2 weeks.  If you have not heard from [place] then please contact us. The fastest way to get your results is to register for My Chart.   IF you  received an x-ray today, you will receive an invoice from [facility]. Please contact [facility] at [phone] with questions or concerns regarding your invoice.   IF you received labwork today, you will receive an invoice from [facility]. Please contact [facility] at [phone] with questions or concerns regarding your invoice.   Our billing staff will not be able to assist you with questions regarding bills from these companies.  You will be contacted with the lab results as soon as they are available. The fastest way to get your results is to activate your My Chart account. Instructions are located on the last page of this paperwork. If you have not heard from [place] regarding the results in 2 weeks, please contact this office.      50 minutes in office care.  11:22 PM  Troponin level less than 0.01  No follow-ups on file.   Ruben Reason, MD 07/06/2019

## 2019-07-06 NOTE — Patient Instructions (Addendum)
  We are making you an appointment to see Dr. Einar Gip or one of his associates tomorrow at 9:45 AM.  Take all your medicines to the office.  [facility] cardiology, [address], [place].  The office number is [phone] should you have difficulty finding it.  Make sure you are there before 9:45.  I have checked a blood test regarding your heart.  If the lab calls me tonight an abnormal report, I would need to call you and send you to the emergency room.  I would like you to take over-the-counter Aleve 2 pills twice daily.  Take it with food for the chest wall pain.  You can continue this for a few days, but in a diabetic we do not want you taking it long-term.  In addition to this, you can take Norco (hydrocodone) 1 every 6 hours if needed for pain.  In the event of worse chest pain, shortness of breath, dizziness or passing out, pain going down your left arm, nausea and vomiting, breaking out in sweat, or other items of concern call 911 and get taken to the emergency room.  It is important that you follow-up with your regular doctor, Dr. [name], in the near future.  I believe this is an atypical chest pain from the chest wall and not from the heart, but I do want the heart doctor to check it out tomorrow to be certain there are no other concerns.     If you have lab work done today you will be contacted with your lab results within the next 2 weeks.  If you have not heard from [place] then please contact us. The fastest way to get your results is to register for My Chart.   IF you received an x-ray today, you will receive an invoice from [facility]. Please contact [facility] at [phone] with questions or concerns regarding your invoice.   IF you received labwork today, you will receive an invoice from [facility]. Please contact [facility] at [phone] with questions or concerns regarding your invoice.   Our billing staff will not be able to assist you with  questions regarding bills from these companies.  You will be contacted with the lab results as soon as they are available. The fastest way to get your results is to activate your My Chart account. Instructions are located on the last page of this paperwork. If you have not heard from [place] regarding the results in 2 weeks, please contact this office.

## 2019-07-06 NOTE — Addendum Note (Signed)
Addended by: La Dibella H on: 07/06/2019 05:16 PM   Modules accepted: Orders

## 2019-07-07 ENCOUNTER — Ambulatory Visit (INDEPENDENT_AMBULATORY_CARE_PROVIDER_SITE_OTHER): Payer: 59 | Admitting: Cardiology

## 2019-07-07 ENCOUNTER — Encounter: Payer: Self-pay | Admitting: Cardiology

## 2019-07-07 ENCOUNTER — Telehealth: Payer: Self-pay | Admitting: Family Medicine

## 2019-07-07 ENCOUNTER — Telehealth: Payer: Self-pay

## 2019-07-07 VITALS — BP 126/86 | HR 78 | Temp 98.0°F | Ht 73.0 in | Wt 214.0 lb

## 2019-07-07 DIAGNOSIS — E7800 Pure hypercholesterolemia, unspecified: Secondary | ICD-10-CM | POA: Diagnosis not present

## 2019-07-07 DIAGNOSIS — R0789 Other chest pain: Secondary | ICD-10-CM

## 2019-07-07 DIAGNOSIS — E119 Type 2 diabetes mellitus without complications: Secondary | ICD-10-CM

## 2019-07-07 DIAGNOSIS — I10 Essential (primary) hypertension: Secondary | ICD-10-CM

## 2019-07-07 MED ORDER — HYDROCODONE-ACETAMINOPHEN 5-325 MG PO TABS: 1.0000 | ORAL_TABLET | Freq: Four times a day (QID) | ORAL | 0 refills | Status: DC | PRN

## 2019-07-07 MED ORDER — METOPROLOL SUCCINATE ER 25 MG PO TB24: 50.0000 mg | ORAL_TABLET | Freq: Every day | ORAL | 2 refills | Status: DC

## 2019-07-07 NOTE — Telephone Encounter (Signed)
Pt calling back to advise hydrocodone-acetaminophen 5-325 mg #12 with 0 refills [facility] [address]  Contact [facility] Caryl Pina) an advised to cancel and was told new rx will need to be sent to [place] on [address].

## 2019-07-07 NOTE — Telephone Encounter (Signed)
Pt called in regard to hydrocodone prescription. It was sent to the [place] on [facility]. He wants it sent to the pharmacy on [address]. Chart has been updated to reflect this preference. Brendan Gonzales has been advised to resend prescription to correct pharmacy

## 2019-07-07 NOTE — Telephone Encounter (Signed)
One tablet daily please. It was an error.

## 2019-07-07 NOTE — Telephone Encounter (Signed)
Noted. Reordered.

## 2019-07-07 NOTE — Telephone Encounter (Signed)
Patient evaluated by Dr. Linna Darner yesterday, hydrocodone was not received by [facility], verified by staff today. printed rx was not given to patient yesterday - verifed with Dr. Clayborn Heron assistant.   I was asked to refill medication, will reorder prescription as provided by Dr. Linna Darner yesterday - electronic rx sent (printed shredded).   No listing on controlled substance database.

## 2019-07-07 NOTE — Progress Notes (Signed)
Primary Physician/Referring:  [name], MD  Patient ID: Brendan Gonzales, male    DOB: Dec 29, 1955, 64 y.o.   MRN: [id number]  Chief Complaint  Patient presents with  . Chest Pain    pt c/o chest pain for 3 days  . New Patient (Initial Visit)   HPI:    Brendan Gonzales  is a 64 y.o. Patient from [place], who was referred to me on a stat basis for evaluation of chest pain, started 3 days ago, initially was on and off retrosternal.  Yesterday he continued to have frequent chest discomfort and presented to the PCPs office for urgent evaluation, EKG was normal, serum troponin was sent and referred to me.  States that he felt better yesterday but again this morning had mild chest discomfort.  No other associated symptoms.  Past medical history significant for uncontrolled diabetes mellitus, hypertension and hyperlipidemia.  Past Medical History:  Diagnosis Date  . Arthritis   . Constipation   . Diabetes mellitus    type 2  . Dizziness   . Headache(784.0)   . Hyperlipidemia   . Hypertension    benign   Past Surgical History:  Procedure Laterality Date  . COLONOSCOPY    . CORONARY ANGIOPLASTY    . HEMORRHOID SURGERY    . right femeroral bypass    . right knee surgery     Social History   Tobacco Use  . Smoking status: Former Research scientist (life sciences)  . Smokeless tobacco: Never Used  Substance Use Topics  . Alcohol use: No    ROS  Review of Systems  Constitution: Negative for weight gain.  Cardiovascular: Negative for dyspnea on exertion, leg swelling and syncope.  Respiratory: Negative for hemoptysis.   Endocrine: Negative for cold intolerance.  Hematologic/Lymphatic: Does not bruise/bleed easily.  Musculoskeletal: Positive for joint pain (knee pain).  Gastrointestinal: Negative for hematochezia and melena.  Neurological: Negative for headaches and light-headedness.   Objective  Blood pressure 126/86, pulse 78, temperature 98 F (36.7 C), height 6\' 1"  (1.854 m), weight 214 lb (97.1  kg), SpO2 97 %.  Vitals with BMI 07/07/2019 07/06/2019 07/22/2018  Height 6\' 1"  6\' 1"  -  Weight 214 lbs 204 lbs 3 oz -  BMI 123XX123 [id number] -  Systolic 123XX123 123XX123 0000000  Diastolic 86 78 76  Pulse 78 85 -     Physical Exam  Neck: No thyromegaly present.  Cardiovascular: Normal rate, regular rhythm and intact distal pulses. Exam reveals no gallop.  Murmur heard.  Early systolic murmur is present with a grade of 2/6 at the upper right sternal border. No leg edema, no JVD.  Pulmonary/Chest: Effort normal and breath sounds normal.  Abdominal: Soft. Bowel sounds are normal. There is abdominal tenderness (mild epigastric tenderness).  Musculoskeletal:     Cervical back: Neck supple.  Skin: Skin is warm and dry.   Laboratory examination:   Recent Labs    07/22/18 1216  NA 142  K 4.0  CL 100  CO2 26  GLUCOSE 135*  BUN 10  CREATININE 0.70*  CALCIUM 8.7  GFRNONAA 102  GFRAA 118   CrCl cannot be calculated (Patient's most recent lab result is older than the maximum 21 days allowed.).  CMP Latest Ref Rng & Units 07/22/2018 04/28/2017 04/26/2017  Glucose 65 - 99 mg/dL 135(H) 112(H) 117(H)  BUN 8 - 27 mg/dL 10 9 10   Creatinine 0.76 - 1.27 mg/dL 0.70(L) 0.71(L) 0.77  Sodium 134 - 144 mmol/L 142 143 134(L)  Potassium  3.5 - 5.2 mmol/L 4.0 4.0 3.2(L)  Chloride 96 - 106 mmol/L 100 101 99(L)  CO2 20 - 29 mmol/L 26 25 27   Calcium 8.6 - 10.2 mg/dL 8.7 9.2 8.9  Total Protein 6.0 - 8.5 g/dL 6.9 7.2 6.7  Total Bilirubin 0.0 - 1.2 mg/dL 0.3 0.3 0.6  Alkaline Phos 39 - 117 IU/L 101 113 86  AST 0 - 40 IU/L 16 21 21   ALT 0 - 44 IU/L 14 13 13(L)   CBC Latest Ref Rng & Units 07/06/2019 04/26/2017 12/30/2016  WBC 4.6 - 10.2 K/uL 4.9 5.4 4.6  Hemoglobin 11 - 14.6 g/dL 11.9 10.5(L) 11.2(A)  Hematocrit 29 - 41 % 36.0 32.2(L) 33.5(A)  Platelets 150 - 400 K/uL - 234 -   Lipid Panel     Component Value Date/Time   CHOL 135 07/22/2018 1216   TRIG 58 07/22/2018 1216   HDL 48 07/22/2018 1216   CHOLHDL 2.8  07/22/2018 1216   CHOLHDL 3.0 02/08/2016 1354   VLDL 23 02/08/2016 1354   LDLCALC 75 07/22/2018 1216   HEMOGLOBIN A1C Lab Results  Component Value Date   HGBA1C 8.8 (A) 07/06/2019   MPG 166 02/08/2016   TSH No results for input(s): TSH in the last 8760 hours.  Medications and allergies  No Known Allergies   Current Outpatient Medications  Medication Instructions  . acetaminophen (TYLENOL) 500 mg, Oral, 3 times daily PRN  . aspirin 81 mg,  Every morning - 10a  . atorvastatin (LIPITOR) 10 MG tablet Take 1 tablet by mouth once daily  . Calcium Carbonate Antacid (TUMS ULTRA 1000 PO) 1 tablet, Oral, 3 times daily PRN  . clotrimazole (LOTRIMIN) 1 % cream 1 application, Topical, 2 times daily  . Cyanocobalamin (VITAMIN B 12 PO) 1,000 mg, Daily  . Garlic [id number] mg, Oral, Daily  . gentamicin ointment (GARAMYCIN) 0.1 % 1 application, Topical, 3 times daily  . HYDROcodone-acetaminophen (NORCO) 5-325 MG tablet 1 tablet, Oral, Every 6 hours PRN  . lisinopril-hydrochlorothiazide (ZESTORETIC) 20-25 MG tablet Take 1 tablet by mouth once daily  . metFORMIN (GLUCOPHAGE) 500 MG tablet Take 2 tablets by mouth twice daily  . metoprolol succinate (TOPROL-XL) 50 mg, Oral, Daily, Take with or immediately following a meal.  . pantoprazole (PROTONIX) 40 MG tablet TAKE 1 TABLET BY MOUTH ONCE DAILY *MUST  KEEPS  1.20.20  APPOINTMENT  FOR  MORE  REFILLS*  . triamcinolone cream (KENALOG) 0.1 % 1 application, Topical, 2 times daily    Radiology:  DG Chest 2 View  Result Date: 07/06/2019 CLINICAL DATA:  64 year old male with chest pain. EXAM: CHEST - 2 VIEW COMPARISON:  Chest radiograph dated 12/21/2016 FINDINGS: No focal consolidation, pleural effusion, pneumothorax. The cardiac silhouette is within normal limits. No acute osseous pathology. IMPRESSION: No active cardiopulmonary disease. Electronically Signed   By: Anner Crete M.D.   On: 07/06/2019 16:44    Cardiac Studies:   Echo Corrigan 12/06/2015:  Normal LV systolic function, EF 123456.  Moderate LVH.  No significant valvular abnormality.  Lexiscan Myoview stress test 11/15/2015: No ischemia.   CATH 2008-Normal Coronary [place]  Assessment     ICD-10-CM   1. Atypical chest pain  R07.89 EKG 12-Lead    PCV ECHOCARDIOGRAM COMPLETE    PCV MYOCARDIAL PERFUSION WITH LEXISCAN  2. Type 2 diabetes mellitus without complication, without long-term current use of insulin (HCC)  E11.9   3. Hypercholesteremia  E78.00   4. Primary hypertension  I10 metoprolol succinate (TOPROL-XL) 25 MG 24  hr tablet    PCV ECHOCARDIOGRAM COMPLETE    EKG 07/07/2019: Normal sinus rhythm at rate of 76 bpm, normal axis.  No evidence of ischemia, normal EKG no significant change from EKG 07/06/2019.    Meds ordered this encounter  Medications  . metoprolol succinate (TOPROL-XL) 25 MG 24 hr tablet    Sig: Take 2 tablets (50 mg total) by mouth daily in the afternoon. Take with or immediately following a meal.    Dispense:  60 tablet    Refill:  2    There are no discontinued medications.   Recommendations:   Brendan Gonzales  is a 64 y.o.patient from [place], who was referred to me on a stat basis for evaluation of chest pain, Has hypertension, hyperlipidemia and uncontrolled diabetes mellitus as risk factors.  Will schedule for an echocardiogram to evaluate chest pain and also murmur.  Schedule for a Lexiscan Sestamibi stress test to evaluate for myocardial ischemia. Patient unable to do treadmill stress testing due to Fluid 19 to reduce aerosol spread.  His test her blood pressure was slightly elevated but also for protecting from cardiac standpoint, I started him on Toprol succinate 25 mg daily.  He'll continue with present dose of Lipitor, continue aspirin, I reviewed the A1c which is uncontrolled. Weight loss stressed again and gave positive reinforcement. [facility] diet (low glycemic) discussed..  Discussed regarding DASH diet and salt restriction.   Brendan Prows, MD, [facility]  07/07/2019, 10:20 AM [facility]. [place] Office: [phone]

## 2019-07-07 NOTE — Telephone Encounter (Signed)
Patient called and stated that you started him on Metoprolol 25mg  one tablet daily. When he picked up the bottle from the pharmacy, it says 2 tablets (50mg ) daily. Which is the correct way to take the medication? Please advise.   From your note:   Medications  . metoprolol succinate (TOPROL-XL) 25 MG 24 hr tablet    Sig: Take 2 tablets (50 mg total) by mouth daily in the afternoon. Take with or immediately following a meal.    Dispense:  60 tablet    Refill:  2    There are no discontinued medications.   Recommendations:   Brendan Gonzales  is a 63 y.o.patient from [place], who was referred to me on a stat basis for evaluation of chest pain, Has hypertension, hyperlipidemia and uncontrolled diabetes mellitus as risk factors.  Will schedule for an echocardiogram to evaluate chest pain and also murmur.  Schedule for a Lexiscan Sestamibi stress test to evaluate for myocardial ischemia. Patient unable to do treadmill stress testing due to Fluid 19 to reduce aerosol spread.  His test her blood pressure was slightly elevated but also for protecting from cardiac standpoint, I started him on Toprol succinate 25 mg daily.  He'll continue with present dose of Lipitor, continue aspirin, I reviewed the A1c which is uncontrolled. Weight loss stressed again and gave positive reinforcement. [facility] diet (low glycemic) discussed..  Discussed regarding DASH diet and salt restriction.

## 2019-07-08 NOTE — Telephone Encounter (Signed)
Called patient, LMAM.

## 2019-07-09 ENCOUNTER — Ambulatory Visit: Payer: BLUE CROSS/BLUE SHIELD | Admitting: Family Medicine

## 2019-07-12 ENCOUNTER — Encounter: Payer: Self-pay | Admitting: Family Medicine

## 2019-07-12 ENCOUNTER — Ambulatory Visit (INDEPENDENT_AMBULATORY_CARE_PROVIDER_SITE_OTHER): Payer: 59

## 2019-07-12 ENCOUNTER — Telehealth: Payer: Self-pay

## 2019-07-12 ENCOUNTER — Other Ambulatory Visit: Payer: Self-pay

## 2019-07-12 DIAGNOSIS — R0789 Other chest pain: Secondary | ICD-10-CM | POA: Diagnosis not present

## 2019-07-12 DIAGNOSIS — I10 Essential (primary) hypertension: Secondary | ICD-10-CM

## 2019-07-12 NOTE — Telephone Encounter (Signed)
Pt stopped by nurse station today after testing and advised that the metoprolol was giving him diarrhea. Pt advised to stop taking by JG. Please advise on change.//ah

## 2019-07-12 NOTE — Telephone Encounter (Signed)
Diarrhea with Metoprolol. Hence discontinued.  Will re evaluate his BP his next OV to evaluate  Nuclear stress today is normal.  Brendan Gonzales

## 2019-07-22 ENCOUNTER — Other Ambulatory Visit: Payer: Self-pay | Admitting: Family Medicine

## 2019-07-22 DIAGNOSIS — E785 Hyperlipidemia, unspecified: Secondary | ICD-10-CM

## 2019-07-22 DIAGNOSIS — K219 Gastro-esophageal reflux disease without esophagitis: Secondary | ICD-10-CM

## 2019-07-22 DIAGNOSIS — I10 Essential (primary) hypertension: Secondary | ICD-10-CM

## 2019-07-29 ENCOUNTER — Other Ambulatory Visit: Payer: Self-pay | Admitting: Emergency Medicine

## 2019-07-29 ENCOUNTER — Other Ambulatory Visit: Payer: Self-pay | Admitting: Family Medicine

## 2019-07-29 DIAGNOSIS — K219 Gastro-esophageal reflux disease without esophagitis: Secondary | ICD-10-CM

## 2019-07-29 DIAGNOSIS — E785 Hyperlipidemia, unspecified: Secondary | ICD-10-CM

## 2019-07-29 DIAGNOSIS — I10 Essential (primary) hypertension: Secondary | ICD-10-CM

## 2019-07-29 DIAGNOSIS — E119 Type 2 diabetes mellitus without complications: Secondary | ICD-10-CM

## 2019-07-29 MED ORDER — LISINOPRIL-HYDROCHLOROTHIAZIDE 20-25 MG PO TABS: 1.0000 | ORAL_TABLET | Freq: Every day | ORAL | 0 refills | Status: DC

## 2019-07-29 MED ORDER — PANTOPRAZOLE SODIUM 40 MG PO TBEC: DELAYED_RELEASE_TABLET | ORAL | 1 refills | Status: DC

## 2019-07-29 MED ORDER — ATORVASTATIN CALCIUM 10 MG PO TABS: 10.0000 mg | ORAL_TABLET | Freq: Every day | ORAL | 0 refills | Status: DC

## 2019-07-29 MED ORDER — METFORMIN HCL 500 MG PO TABS: 1000.0000 mg | ORAL_TABLET | Freq: Two times a day (BID) | ORAL | 0 refills | Status: DC

## 2019-07-29 NOTE — Telephone Encounter (Signed)
All request already taken care of by provider.

## 2019-07-29 NOTE — Telephone Encounter (Signed)
Pt came into office requesting med refills. Pt has an upcoming appt on 2/24 for med refills, but he is out of medication. Pt says he uses [facility] on [address]  Pt informed that if he does not show for appt on 2/24, his next refill will be refused.  Prescriptions were filled on 2/4

## 2019-08-17 ENCOUNTER — Ambulatory Visit: Payer: 59 | Admitting: Cardiology

## 2019-08-18 ENCOUNTER — Encounter: Payer: Self-pay | Admitting: Family Medicine

## 2019-08-18 ENCOUNTER — Other Ambulatory Visit: Payer: Self-pay

## 2019-08-18 ENCOUNTER — Ambulatory Visit (INDEPENDENT_AMBULATORY_CARE_PROVIDER_SITE_OTHER): Payer: 59 | Admitting: Family Medicine

## 2019-08-18 ENCOUNTER — Telehealth: Payer: Self-pay | Admitting: Family Medicine

## 2019-08-18 VITALS — BP 146/84 | HR 70 | Temp 98.5°F | Resp 16 | Ht 73.0 in | Wt 203.0 lb

## 2019-08-18 DIAGNOSIS — K219 Gastro-esophageal reflux disease without esophagitis: Secondary | ICD-10-CM

## 2019-08-18 DIAGNOSIS — E1140 Type 2 diabetes mellitus with diabetic neuropathy, unspecified: Secondary | ICD-10-CM

## 2019-08-18 DIAGNOSIS — E785 Hyperlipidemia, unspecified: Secondary | ICD-10-CM

## 2019-08-18 DIAGNOSIS — I10 Essential (primary) hypertension: Secondary | ICD-10-CM | POA: Diagnosis not present

## 2019-08-18 DIAGNOSIS — E119 Type 2 diabetes mellitus without complications: Secondary | ICD-10-CM | POA: Diagnosis not present

## 2019-08-18 MED ORDER — LISINOPRIL 10 MG PO TABS: 10.0000 mg | ORAL_TABLET | Freq: Every day | ORAL | 1 refills | Status: DC

## 2019-08-18 MED ORDER — PANTOPRAZOLE SODIUM 40 MG PO TBEC: DELAYED_RELEASE_TABLET | ORAL | 1 refills | Status: DC

## 2019-08-18 MED ORDER — METFORMIN HCL 500 MG PO TABS: 1000.0000 mg | ORAL_TABLET | Freq: Two times a day (BID) | ORAL | 1 refills | Status: DC

## 2019-08-18 MED ORDER — ATORVASTATIN CALCIUM 10 MG PO TABS: 10.0000 mg | ORAL_TABLET | Freq: Every day | ORAL | 1 refills | Status: DC

## 2019-08-18 MED ORDER — LISINOPRIL-HYDROCHLOROTHIAZIDE 20-25 MG PO TABS: 1.0000 | ORAL_TABLET | Freq: Every day | ORAL | 1 refills | Status: DC

## 2019-08-18 NOTE — Patient Instructions (Addendum)
  I will check your diabetes tests today, but you may need additional meds.   Option of every other day for protonix if well controlled.   Take additional 10 mg of lisinopril in addition to your combination pill for improved blood pressure control.  Stop that extra dose if any lightheadedness or low readings.  Let me know if that occurs.  Thank you for coming in today.  Follow-up in 3 months.   If you have lab work done today you will be contacted with your lab results within the next 2 weeks.  If you have not heard from [place] then please contact us. The fastest way to get your results is to register for My Chart.   IF you received an x-ray today, you will receive an invoice from [facility]. Please contact [facility] at [phone] with questions or concerns regarding your invoice.   IF you received labwork today, you will receive an invoice from [place]. Please contact [facility] at [phone] with questions or concerns regarding your invoice.   Our billing staff will not be able to assist you with questions regarding bills from these companies.  You will be contacted with the lab results as soon as they are available. The fastest way to get your results is to activate your My Chart account. Instructions are located on the last page of this paperwork. If you have not heard from [place] regarding the results in 2 weeks, please contact this office.

## 2019-08-18 NOTE — Telephone Encounter (Signed)
08/18/2019 - PATIENT SAW DR. Carlota Raspberry ON Wednesday (08/18/2019) FOR MEDICATION REFILLS. DR. Carlota Raspberry HAS REQUESTED HE RETURN FOR FOLLOW-UP IN 3 MONTHS. I DID NOT SEE THIS PATIENT COME TO CHECK-OUT. I TRIED TO CALL AND SCHEDULE BUT HAD TO LEAVE HIM A MESSAGE ON HIS VOICE MAIL TO RETURN MY CALL. [place]

## 2019-08-18 NOTE — Progress Notes (Signed)
Subjective:  Patient ID: Brendan Gonzales, male    DOB: [birth year]  Age: 64 y.o. MRN: [id number]  CC:  Chief Complaint  Patient presents with  . Diabetes    pt would like some labs done he is concerned about his kidneys, pt also needs medication refills     HPI Brendan Gonzales presents for follow-up.   Last visit with me over 1 year ago, and at that time did ask  that he follow-up in 3 to 4 weeks to review other nonacute issues as well as reschedule a physical. Out of insurance last year. Has coverage now.   Diabetes: Type II, hx of neuropathy in past- doing ok now. Improves with exercises with hands.  Treated with Metformin 1000 mg twice daily.  He is on statin with Lipitor, ACE with lisinopril. No new side effects with meds.  Home readings 174 last night.  Fasting -102-125.  Postprandials - up to 200.   Microalbumin: Ratio 07/22/2018 normal. Optho, foot exam, pneumovax: Due for foot exam today, otherwise up-to-date.  Lab Results  Component Value Date   HGBA1C 8.8 (A) 07/06/2019   HGBA1C 7.8 (H) 07/22/2018   HGBA1C 6.5 (H) 04/28/2017   Lab Results  Component Value Date   MICROALBUR 3.9 10/02/2015   LDLCALC 75 07/22/2018   CREATININE 0.70 (L) 07/22/2018   Hypertension: Lisinopril HCTZ 20/25 mg daily.  cardiologist- Dr Einar Gip. Started on metoprolol at Jan visit - stopped d/t diarrhea. eval for chest pain. Normal EF and wall motion,  Grade 1 aortic regurgitation on echo 07/12/19. Low risk stress test on 07/12/19. Aerobic intolerance. Normal myocardial perfusion.   Home readings:130//80's.  BP Readings from Last 3 Encounters:  08/18/19 (!) 146/84  07/07/19 126/86  07/06/19 126/78   Lab Results  Component Value Date   CREATININE 0.70 (L) 07/22/2018    Hyperlipidemia: Lipitor 10 mg daily. No new side effects.  Lab Results  Component Value Date   CHOL 135 07/22/2018   HDL 48 07/22/2018   LDLCALC 75 07/22/2018   TRIG 58 07/22/2018   CHOLHDL 2.8 07/22/2018   Lab Results   Component Value Date   ALT 14 07/22/2018   AST 16 07/22/2018   ALKPHOS 101 07/22/2018   BILITOT 0.3 07/22/2018    GERD Has taken Protonix 40 mg daily.  Option of every other day dosing discussed last year.still taking daily.    History Patient Active Problem List   Diagnosis Date Noted  . Hospice care patient 07/06/2019  . Eczema of hand 10/19/2014  . Type 2 diabetes mellitus ([place]) 06/13/2013  . HTN (hypertension) 06/13/2013  . Normal coronary arteries- 2008 06/13/2013  . Chest pain with moderate risk of acute coronary syndrome 06/12/2013  . Special screening for malignant neoplasms, colon 06/27/2011  . Arthritis of knee 06/14/2011   Past Medical History:  Diagnosis Date  . Arthritis   . Constipation   . Diabetes mellitus    type 2  . Dizziness   . Headache(784.0)   . Hyperlipidemia   . Hypertension    benign   Past Surgical History:  Procedure Laterality Date  . COLONOSCOPY    . CORONARY ANGIOPLASTY    . HEMORRHOID SURGERY    . right femeroral bypass    . right knee surgery     Allergies  Allergen Reactions  . Metoprolol Diarrhea   Prior to Admission medications   Medication Sig Start Date End Date Taking? Authorizing Provider  acetaminophen (TYLENOL) 500 MG tablet Take 500 mg  by mouth 3 (three) times daily as needed for mild pain.    Yes [provider]  aspirin 81 MG tablet Take 81 mg by mouth every morning.    Yes [provider]  atorvastatin (LIPITOR) 10 MG tablet Take 1 tablet (10 mg total) by mouth daily. 07/29/19  Yes Posey Boyer, MD  Calcium Carbonate Antacid (TUMS ULTRA 1000 PO) Take 1 tablet by mouth 3 (three) times daily as needed (indigestion).   Yes [provider]  clotrimazole (LOTRIMIN) 1 % cream Apply 1 application topically 2 (two) times daily. 07/22/18  Yes [name], MD  Cyanocobalamin (VITAMIN B 12 PO) Take 1,000 mg by mouth daily.   Yes [provider]  Garlic 123XX123 MG CAPS Take 1,000 mg by  mouth daily.    Yes [provider]  gentamicin ointment (GARAMYCIN) 0.1 % Apply 1 application topically 3 (three) times daily. 04/26/17  Yes Tanna Furry, MD  HYDROcodone-acetaminophen (NORCO) 5-325 MG tablet Take 1 tablet by mouth every 6 (six) hours as needed. 07/07/19  Yes [name], MD  lisinopril-hydrochlorothiazide (ZESTORETIC) 20-25 MG tablet Take 1 tablet by mouth daily. 07/29/19  Yes Posey Boyer, MD  metFORMIN (GLUCOPHAGE) 500 MG tablet Take 2 tablets (1,000 mg total) by mouth 2 (two) times daily. 07/29/19  Yes Posey Boyer, MD  pantoprazole (PROTONIX) 40 MG tablet TAKE 1 TABLET BY MOUTH ONCE DAILY *MUST  KEEPS  1.20.20  APPOINTMENT  FOR  MORE  REFILLS* 07/29/19  Yes Posey Boyer, MD  triamcinolone cream (KENALOG) 0.1 % Apply 1 application topically 2 (two) times daily. 07/22/18  Yes [name], MD   Social History   Socioeconomic History  . Marital status: Married    Spouse name: Not on file  . Number of children: Not on file  . Years of education: Not on file  . Highest education level: Not on file  Occupational History  . Not on file  Tobacco Use  . Smoking status: Former Research scientist (life sciences)  . Smokeless tobacco: Never Used  Substance and Sexual Activity  . Alcohol use: No  . Drug use: No  . Sexual activity: Never  Other Topics Concern  . Not on file  Social History Narrative  . Not on file   Social Determinants of Health   Financial Resource Strain:   . Difficulty of Paying Living Expenses: Not on file  Food Insecurity:   . Worried About Charity fundraiser in the Last Year: Not on file  . Ran Out of Food in the Last Year: Not on file  Transportation Needs:   . Lack of Transportation (Medical): Not on file  . Lack of Transportation (Non-Medical): Not on file  Physical Activity:   . Days of Exercise per Week: Not on file  . Minutes of Exercise per Session: Not on file  Stress:   . Feeling of Stress : Not on file  Social Connections:   . Frequency  of Communication with Friends and Family: Not on file  . Frequency of Social Gatherings with Friends and Family: Not on file  . Attends Religious Services: Not on file  . Active Member of Clubs or Organizations: Not on file  . Attends Archivist Meetings: Not on file  . Marital Status: Not on file  Intimate Partner Violence:   . Fear of Current or Ex-Partner: Not on file  . Emotionally Abused: Not on file  . Physically Abused: Not on file  .  Sexually Abused: Not on file    Review of Systems  Constitutional: Negative for fatigue and unexpected weight change.  Eyes: Negative for visual disturbance.  Respiratory: Negative for cough, chest tightness and shortness of breath.   Cardiovascular: Negative for chest pain, palpitations and leg swelling.  Gastrointestinal: Negative for abdominal pain and blood in stool.  Neurological: Negative for dizziness, light-headedness and headaches (rare - improved from past - no new headaches. ).     Objective:   Vitals:   08/18/19 0957 08/18/19 1111  BP: (!) 150/84 (!) 146/84  Pulse: 70   Resp: 16   Temp: 98.5 F (36.9 C)   TempSrc: Temporal   SpO2: 98%   Weight: 203 lb (92.1 kg)   Height: 6\' 1"  (1.854 m)      Physical Exam Vitals reviewed.  Constitutional:      Appearance: He is well-developed.  HENT:     Head: Normocephalic and atraumatic.  Eyes:     Pupils: Pupils are equal, round, and reactive to light.  Neck:     Vascular: No carotid bruit or JVD.  Cardiovascular:     Rate and Rhythm: Normal rate and regular rhythm.     Heart sounds: Normal heart sounds. No murmur.  Pulmonary:     Effort: Pulmonary effort is normal.     Breath sounds: Normal breath sounds. No rales.  Skin:    General: Skin is warm and dry.  Neurological:     Mental Status: He is alert and oriented to person, place, and time.        Assessment & Plan:  Brendan Gonzales is a 64 y.o. male . Hyperlipidemia, unspecified hyperlipidemia type - Plan:  Lipid panel, atorvastatin (LIPITOR) 10 MG tablet  -Tolerating Lipitor, continue same, labs pending  Diabetes mellitus without complication ([place]) - Plan: Comprehensive metabolic panel, metFORMIN (GLUCOPHAGE) 500 MG tablet Type 2 diabetes, controlled, with neuropathy ([place]) - Plan: Microalbumin / creatinine urine ratio, Hemoglobin A1c  -Continue Metformin for now, check A1c.  Check microalbumin creatinine ratio.  Essential hypertension - Plan: Comprehensive metabolic panel, lisinopril (ZESTRIL) 10 MG tablet, lisinopril-hydrochlorothiazide (ZESTORETIC) 20-25 MG tablet  -Slight elevation, add 10 mg lisinopril to current combination regimen.  Labs pending  Gastroesophageal reflux disease, unspecified whether esophagitis present - Plan: pantoprazole (PROTONIX) 40 MG tablet  -Tolerating Protonix, option of intermittent dosing discussed.  Refilled for daily dosing if needed  Meds ordered this encounter  Medications  . lisinopril (ZESTRIL) 10 MG tablet    Sig: Take 1 tablet (10 mg total) by mouth daily.    Dispense:  90 tablet    Refill:  1  . pantoprazole (PROTONIX) 40 MG tablet    Sig: TAKE 1 TABLET BY MOUTH ONCE DAILY    Dispense:  90 tablet    Refill:  1  . lisinopril-hydrochlorothiazide (ZESTORETIC) 20-25 MG tablet    Sig: Take 1 tablet by mouth daily.    Dispense:  90 tablet    Refill:  1  . metFORMIN (GLUCOPHAGE) 500 MG tablet    Sig: Take 2 tablets (1,000 mg total) by mouth 2 (two) times daily.    Dispense:  360 tablet    Refill:  1  . atorvastatin (LIPITOR) 10 MG tablet    Sig: Take 1 tablet (10 mg total) by mouth daily.    Dispense:  90 tablet    Refill:  1   Patient Instructions    I will check your diabetes tests today, but you may need additional  meds.   Option of every other day for protonix if well controlled.   Take additional 10 mg of lisinopril in addition to your combination pill for improved blood pressure control.  Stop that extra dose if any lightheadedness or  low readings.  Let me know if that occurs.  Thank you for coming in today.  Follow-up in 3 months.   If you have lab work done today you will be contacted with your lab results within the next 2 weeks.  If you have not heard from [place] then please contact us. The fastest way to get your results is to register for My Chart.   IF you received an x-ray today, you will receive an invoice from [facility]. Please contact [facility] at [phone] with questions or concerns regarding your invoice.   IF you received labwork today, you will receive an invoice from [place]. Please contact [facility] at [phone] with questions or concerns regarding your invoice.   Our billing staff will not be able to assist you with questions regarding bills from these companies.  You will be contacted with the lab results as soon as they are available. The fastest way to get your results is to activate your My Chart account. Instructions are located on the last page of this paperwork. If you have not heard from [place] regarding the results in 2 weeks, please contact this office.         Signed, [name], MD [facility]

## 2019-08-19 LAB — LIPID PANEL
Chol/HDL Ratio: 3.7 ratio (ref 0.0–5.0)
Cholesterol, Total: 168 mg/dL (ref 100–199)
HDL: 45 mg/dL (ref >39)
LDL Chol Calc (NIH): 101 mg/dL — ABNORMAL HIGH (ref 0–99)
Triglycerides: 123 mg/dL (ref 0–149)
VLDL Cholesterol Cal: 22 mg/dL (ref 5–40)

## 2019-08-19 LAB — MICROALBUMIN / CREATININE URINE RATIO
Creatinine, Urine: 82.9 mg/dL
Microalb/Creat Ratio: 22 mg/g creat (ref 0–29)
Microalbumin, Urine: 18.5 ug/mL

## 2019-08-19 LAB — COMPREHENSIVE METABOLIC PANEL
ALT: 19 IU/L (ref 0–44)
AST: 20 IU/L (ref 0–40)
Albumin/Globulin Ratio: 1.6 (ref 1.2–2.2)
Albumin: 4.4 g/dL (ref 3.8–4.8)
Alkaline Phosphatase: 99 IU/L (ref 39–117)
BUN/Creatinine Ratio: 8 — ABNORMAL LOW (ref 10–24)
BUN: 6 mg/dL — ABNORMAL LOW (ref 8–27)
Bilirubin Total: 0.3 mg/dL (ref 0.0–1.2)
CO2: 23 mmol/L (ref 20–29)
Calcium: 9.3 mg/dL (ref 8.6–10.2)
Chloride: 100 mmol/L (ref 96–106)
Creatinine, Ser: 0.73 mg/dL — ABNORMAL LOW (ref 0.76–1.27)
GFR calc Af Amer: 115 mL/min/{1.73_m2} (ref >59)
GFR calc non Af Amer: 99 mL/min/{1.73_m2} (ref >59)
Globulin, Total: 2.7 g/dL (ref 1.5–4.5)
Glucose: 135 mg/dL — ABNORMAL HIGH (ref 65–99)
Potassium: 4 mmol/L (ref 3.5–5.2)
Sodium: 139 mmol/L (ref 134–144)
Total Protein: 7.1 g/dL (ref 6.0–8.5)

## 2019-08-19 LAB — HEMOGLOBIN A1C
Est. average glucose Bld gHb Est-mCnc: 189 mg/dL
Hgb A1c MFr Bld: 8.2 % — ABNORMAL HIGH (ref 4.8–5.6)

## 2019-10-16 ENCOUNTER — Other Ambulatory Visit: Payer: Self-pay | Admitting: Family Medicine

## 2019-10-16 DIAGNOSIS — E785 Hyperlipidemia, unspecified: Secondary | ICD-10-CM

## 2020-02-24 ENCOUNTER — Ambulatory Visit: Payer: 59 | Admitting: Family Medicine

## 2020-03-31 ENCOUNTER — Other Ambulatory Visit: Payer: Self-pay

## 2020-03-31 ENCOUNTER — Ambulatory Visit (HOSPITAL_COMMUNITY)
Admission: EM | Admit: 2020-03-31 | Discharge: 2020-03-31 | Disposition: A | Discharge status: Home / Self Care | Payer: 59 | Attending: Family Medicine | Admitting: Family Medicine

## 2020-03-31 ENCOUNTER — Encounter (HOSPITAL_COMMUNITY): Payer: Self-pay | Admitting: Emergency Medicine

## 2020-03-31 ENCOUNTER — Ambulatory Visit (INDEPENDENT_AMBULATORY_CARE_PROVIDER_SITE_OTHER): Payer: 59

## 2020-03-31 DIAGNOSIS — M545 Low back pain, unspecified: Secondary | ICD-10-CM | POA: Diagnosis not present

## 2020-03-31 DIAGNOSIS — M25562 Pain in left knee: Secondary | ICD-10-CM | POA: Diagnosis not present

## 2020-03-31 DIAGNOSIS — M25561 Pain in right knee: Secondary | ICD-10-CM

## 2020-03-31 DIAGNOSIS — G8929 Other chronic pain: Secondary | ICD-10-CM | POA: Diagnosis not present

## 2020-03-31 MED ORDER — CYCLOBENZAPRINE HCL 5 MG PO TABS: 5.0000 mg | ORAL_TABLET | Freq: Every evening | ORAL | 0 refills | Status: DC | PRN

## 2020-03-31 MED ORDER — DICLOFENAC SODIUM 1 % EX GEL: 2.0000 g | Freq: Four times a day (QID) | CUTANEOUS | 1 refills | Status: DC

## 2020-03-31 NOTE — ED Triage Notes (Signed)
Bilateral knee pain for years.   Low back pain for 2 weeks. No known injury.

## 2020-03-31 NOTE — ED Provider Notes (Signed)
[facility]    CSN: [id number] Arrival date & time: 03/31/20  1317      History   Chief Complaint Chief Complaint  Patient presents with  . Knee Pain    years  . Back Pain    2 weeks    HPI Brendan Gonzales is a 64 y.o. male.   Here today c/o 2 weeks of b/l low back aching and about 15 years of b/l knee pain. States the back pain seems to flare when he stands too long at work or has to lift heavy objects. Does not radiate down legs, no numbness or tingling, fever, bowel or bladder incontinence, decreased movement. The knee pain has been ongoing, waxes and wanes. Known hx of OA of knees with past surgical procedure to right knee and many cortisone injections b/l though these mess with his diabetes so he has stopped taking them. Denies knee inflammation, erythema, new injury. Taking tylenol for these issues wtihout much relief.      Past Medical History:  Diagnosis Date  . Arthritis   . Constipation   . Diabetes mellitus    type 2  . Dizziness   . Headache(784.0)   . Hyperlipidemia   . Hypertension    benign    Patient Active Problem List   Diagnosis Date Noted  . Hospice care patient 07/06/2019  . Eczema of hand 10/19/2014  . Type 2 diabetes mellitus ([place]) 06/13/2013  . HTN (hypertension) 06/13/2013  . Normal coronary arteries- 2008 06/13/2013  . Chest pain with moderate risk of acute coronary syndrome 06/12/2013  . Special screening for malignant neoplasms, colon 06/27/2011  . Arthritis of knee 06/14/2011    Past Surgical History:  Procedure Laterality Date  . COLONOSCOPY    . CORONARY ANGIOPLASTY    . HEMORRHOID SURGERY    . right femeroral bypass    . right knee surgery         Home Medications    Prior to Admission medications   Medication Sig Start Date End Date Taking? Authorizing Provider  acetaminophen (TYLENOL) 500 MG tablet Take 500 mg by mouth 3 (three) times daily as needed for mild pain.     [provider]  aspirin 81  MG tablet Take 81 mg by mouth every morning.     [provider]  atorvastatin (LIPITOR) 10 MG tablet Take 1 tablet (10 mg total) by mouth daily. 08/18/19   [name], MD  Calcium Carbonate Antacid (TUMS ULTRA 1000 PO) Take 1 tablet by mouth 3 (three) times daily as needed (indigestion).    [provider]  clotrimazole (LOTRIMIN) 1 % cream Apply 1 application topically 2 (two) times daily. 07/22/18   [name], MD  Cyanocobalamin (VITAMIN B 12 PO) Take 1,000 mg by mouth daily.    [provider]  cyclobenzaprine (FLEXERIL) 5 MG tablet Take 1 tablet (5 mg total) by mouth at bedtime as needed for muscle spasms. 03/31/20   Volney American, PA-C  diclofenac Sodium (VOLTAREN) 1 % GEL Apply 2 g topically 4 (four) times daily. 03/31/20   Volney American, PA-C  Garlic 6606 MG CAPS Take 1,000 mg by mouth daily.     [provider]  gentamicin ointment (GARAMYCIN) 0.1 % Apply 1 application topically 3 (three) times daily. 04/26/17   Tanna Furry, MD  HYDROcodone-acetaminophen (NORCO) 5-325 MG tablet Take 1 tablet by mouth every 6 (six) hours as needed. 07/07/19   [name], MD  lisinopril (ZESTRIL) 10 MG tablet Take 1 tablet (10 mg total) by mouth daily. 08/18/19   [name], MD  lisinopril-hydrochlorothiazide (ZESTORETIC) 20-25 MG tablet Take 1 tablet by mouth daily. 08/18/19   [name], MD  metFORMIN (GLUCOPHAGE) 500 MG tablet Take 2 tablets (1,000 mg total) by mouth 2 (two) times daily. 08/18/19   [name], MD  pantoprazole (PROTONIX) 40 MG tablet TAKE 1 TABLET BY MOUTH ONCE DAILY 08/18/19   [name], MD  triamcinolone cream (KENALOG) 0.1 % Apply 1 application topically 2 (two) times daily. 07/22/18   [name], MD    Family History Family History  Problem Relation Age of Onset  . Diabetes Other   . Hypertension Other   . Anesthesia problems Neg Hx   . Hypotension Neg Hx   . Malignant  hyperthermia Neg Hx   . Pseudochol deficiency Neg Hx     Social History Social History   Tobacco Use  . Smoking status: Former Research scientist (life sciences)  . Smokeless tobacco: Never Used  Substance Use Topics  . Alcohol use: No  . Drug use: No     Allergies   Metoprolol   Review of Systems Review of Systems PER HPI    Physical Exam Triage Vital Signs ED Triage Vitals  Enc Vitals Group     BP 03/31/20 1356 (!) 151/79     Pulse Rate 03/31/20 1356 (!) 56     Resp 03/31/20 1356 16     Temp 03/31/20 1356 98.2 F (36.8 C)     Temp Source 03/31/20 1356 Oral     SpO2 03/31/20 1356 97 %     Weight --      Height --      Head Circumference --      Peak Flow --      Pain Score 03/31/20 1355 7     Pain Loc --      Pain Edu? --      Excl. in [place]? --    No data found.  Updated Vital Signs BP (!) 151/79   Pulse (!) 56   Temp 98.2 F (36.8 C) (Oral)   Resp 16   SpO2 97%   Visual Acuity Right Eye Distance:   Left Eye Distance:   Bilateral Distance:    Right Eye Near:   Left Eye Near:    Bilateral Near:     Physical Exam Vitals and nursing note reviewed.  Constitutional:      Appearance: Normal appearance.  HENT:     Head: Atraumatic.     Nose: Nose normal.     Mouth/Throat:     Mouth: Mucous membranes are moist.     Pharynx: Oropharynx is clear.  Eyes:     Extraocular Movements: Extraocular movements intact.     Conjunctiva/sclera: Conjunctivae normal.  Cardiovascular:     Rate and Rhythm: Normal rate and regular rhythm.     Pulses: Normal pulses.  Pulmonary:     Effort: Pulmonary effort is normal.     Breath sounds: Normal breath sounds.  Abdominal:     General: Bowel sounds are normal. There is no distension.     Palpations: Abdomen is soft.     Tenderness: There is no abdominal tenderness. There is no right CVA tenderness, left CVA tenderness or guarding.  Musculoskeletal:        General: Tenderness (b/l joint line ttp b/l knees) present. No swelling or  deformity. Normal range of motion.  Cervical back: Normal range of motion and neck supple.     Comments: Good ROM, no midline lumbar ttp Mild b/l lumbar paraspinal ttp - SLR b/l  Skin:    General: Skin is warm and dry.     Findings: No erythema or rash.  Neurological:     General: No focal deficit present.     Mental Status: He is oriented to person, place, and time.     Sensory: No sensory deficit.     Motor: No weakness.     Gait: Gait normal.  Psychiatric:        Mood and Affect: Mood normal.        Thought Content: Thought content normal.        Judgment: Judgment normal.    UC Treatments / Results  Labs (all labs ordered are listed, but only abnormal results are displayed) Labs Reviewed - No data to display  EKG   Radiology DG Knee Complete 4 Views Left  Result Date: 03/31/2020 CLINICAL DATA:  Knee pain for several years with pain and swelling, initial encounter EXAM: LEFT KNEE - COMPLETE 4+ VIEW COMPARISON:  07/26/2013 FINDINGS: No acute fracture or dislocation is noted. No joint effusion is seen. Mild degenerative changes are noted. No other focal abnormality is noted. IMPRESSION: Mild degenerative change without acute abnormality. The overall appearance is similar to that seen on the prior exam. Electronically Signed   By: Inez Catalina M.D.   On: 03/31/2020 15:12   DG Knee Complete 4 Views Right  Result Date: 03/31/2020 CLINICAL DATA:  Knee pain for several years with no known injury, initial encounter EXAM: RIGHT KNEE - COMPLETE 4+ VIEW COMPARISON:  07/26/2013 FINDINGS: Degenerative changes are noted. No acute fracture or dislocation is noted. No joint effusion is seen. IMPRESSION: Degenerative change without acute abnormality. Electronically Signed   By: Inez Catalina M.D.   On: 03/31/2020 15:13    Procedures Procedures (including critical care time)  Medications Ordered in UC Medications - No data to display  Initial Impression / Assessment and Plan / UC  Course  I have reviewed the triage vital signs and the nursing notes.  Pertinent labs & imaging results that were available during my care of the patient were reviewed by me and considered in my medical decision making (see chart for details).     Suspect muscular pain in lumbar region - will tx with flexeril, lidocaine patches, massage, voltaren gel prn. Discussed core strengthening and posture exercises to help reduce strain to low back muscles. Patient requested b/l knee x-rays though pattern and history consistent with OA. X-rays confirm OA degenerative changes, will rx voltaren gel and discussed maintaining healthy body weight. F/u with Orthopedics if worsening and impacting daily function.   Final Clinical Impressions(s) / UC Diagnoses   Final diagnoses:  Acute bilateral low back pain without sciatica  Chronic pain of both knees   Discharge Instructions   None    ED Prescriptions    Medication Sig Dispense Auth. Provider   cyclobenzaprine (FLEXERIL) 5 MG tablet Take 1 tablet (5 mg total) by mouth at bedtime as needed for muscle spasms. 15 tablet Volney American, [place]   diclofenac Sodium (VOLTAREN) 1 % GEL Apply 2 g topically 4 (four) times daily. 100 g Volney American, [place]     PDMP not reviewed this encounter.   Volney American, [place] 03/31/20 1733

## 2020-04-12 ENCOUNTER — Other Ambulatory Visit: Payer: Self-pay

## 2020-04-12 ENCOUNTER — Encounter: Payer: Self-pay | Admitting: Family Medicine

## 2020-04-12 ENCOUNTER — Ambulatory Visit: Payer: 59 | Admitting: Family Medicine

## 2020-04-12 VITALS — BP 150/76 | HR 62 | Temp 98.2°F | Ht 73.0 in | Wt 198.4 lb

## 2020-04-12 DIAGNOSIS — E785 Hyperlipidemia, unspecified: Secondary | ICD-10-CM | POA: Diagnosis not present

## 2020-04-12 DIAGNOSIS — K219 Gastro-esophageal reflux disease without esophagitis: Secondary | ICD-10-CM | POA: Diagnosis not present

## 2020-04-12 DIAGNOSIS — E11649 Type 2 diabetes mellitus with hypoglycemia without coma: Secondary | ICD-10-CM | POA: Diagnosis not present

## 2020-04-12 DIAGNOSIS — I10 Essential (primary) hypertension: Secondary | ICD-10-CM | POA: Diagnosis not present

## 2020-04-12 MED ORDER — METFORMIN HCL 500 MG PO TABS: 1000.0000 mg | ORAL_TABLET | Freq: Two times a day (BID) | ORAL | 1 refills | Status: DC

## 2020-04-12 MED ORDER — PANTOPRAZOLE SODIUM 40 MG PO TBEC: DELAYED_RELEASE_TABLET | ORAL | 1 refills | Status: DC

## 2020-04-12 MED ORDER — LISINOPRIL-HYDROCHLOROTHIAZIDE 20-12.5 MG PO TABS: 2.0000 | ORAL_TABLET | Freq: Every day | ORAL | 1 refills | Status: DC

## 2020-04-12 NOTE — Patient Instructions (Addendum)
Try to eat a 3rd meal per day or healthy snack between meals to lessen risk of low blood sugar. If low sugar returns, follow up in office to discuss other changes. Depending on a1c may need to make changes.  Return to the clinic or go to the nearest emergency room if any of your symptoms worsen or new symptoms occur.  Blood pressure still too high here today.  Change medication to 2 of the lisinopril HCTZ combo pills but at lower dose of HCTZ.  Should be a total of 40 mg lisinopril, 25 mg of hydrochlorothiazide.  Recheck in 1 month.   Hypoglycemia Hypoglycemia occurs when the level of sugar (glucose) in the blood is too low. Hypoglycemia can happen in people who do or do not have diabetes. It can develop quickly, and it can be a medical emergency. For most people with diabetes, a blood glucose level below 70 mg/dL (3.9 mmol/L) is considered hypoglycemia. Glucose is a type of sugar that provides the body's main source of energy. Certain hormones (insulin and glucagon) control the level of glucose in the blood. Insulin lowers blood glucose, and glucagon raises blood glucose. Hypoglycemia can result from having too much insulin in the bloodstream, or from not eating enough food that contains glucose. You may also have reactive hypoglycemia, which happens within 4 hours after eating a meal. What are the causes? Hypoglycemia occurs most often in people who have diabetes and may be caused by:  Diabetes medicine.  Not eating enough, or not eating often enough.  Increased physical activity.  Drinking alcohol on an empty stomach. If you do not have diabetes, hypoglycemia may be caused by:  A tumor in the pancreas.  Not eating enough, or not eating for long periods at a time (fasting).  A severe infection or illness.  Certain medicines. What increases the risk? Hypoglycemia is more likely to develop in:  People who have diabetes and take medicines to lower blood glucose.  People who abuse  alcohol.  People who have a severe illness. What are the signs or symptoms? Mild symptoms Mild hypoglycemia may not cause any symptoms. If you do have symptoms, they may include:  Hunger.  Anxiety.  Sweating and feeling clammy.  Dizziness or feeling light-headed.  Sleepiness.  Nausea.  Increased heart rate.  Headache.  Blurry vision.  Irritability.  Tingling or numbness around the mouth, lips, or tongue.  A change in coordination.  Restless sleep. Moderate symptoms Moderate hypoglycemia can cause:  Mental confusion and poor judgment.  Behavior changes.  Weakness.  Irregular heartbeat. Severe symptoms Severe hypoglycemia is a medical emergency. It can cause:  Fainting.  Seizures.  Loss of consciousness (coma).  Death. How is this diagnosed? Hypoglycemia is diagnosed with a blood test to measure your blood glucose level. This blood test is done while you are having symptoms. Your health care provider may also do a physical exam and review your medical history. How is this treated? This condition can often be treated by immediately eating or drinking something that contains sugar, such as:  Fruit juice, 4-6 oz (120-150 mL).  Regular soda (not diet soda), 4-6 oz (120-150 mL).  Low-fat milk, 4 oz (120 mL).  Several pieces of hard candy.  Sugar or honey, 1 Tbsp (15 mL). Treating hypoglycemia if you have diabetes If you are alert and able to swallow safely, follow the 15:15 rule:  Take 15 grams of a rapid-acting carbohydrate. Talk with your health care provider about how much  you should take.  Rapid-acting options include: ? Glucose pills (take 15 grams). ? 6-8 pieces of hard candy. ? 4-6 oz (120-150 mL) of fruit juice. ? 4-6 oz (120-150 mL) of regular (not diet) soda. ? 1 Tbsp (15 mL) honey or sugar.  Check your blood glucose 15 minutes after you take the carbohydrate.  If the repeat blood glucose level is still at or below 70 mg/dL (3.9  mmol/L), take 15 grams of a carbohydrate again.  If your blood glucose level does not increase above 70 mg/dL (3.9 mmol/L) after 3 tries, seek emergency medical care.  After your blood glucose level returns to normal, eat a meal or a snack within 1 hour.  Treating severe hypoglycemia Severe hypoglycemia is when your blood glucose level is at or below 54 mg/dL (3 mmol/L). Severe hypoglycemia is a medical emergency. Get medical help right away. If you have severe hypoglycemia and you cannot eat or drink, you may need an injection of glucagon. A family member or close friend should learn how to check your blood glucose and how to give you a glucagon injection. Ask your health care provider if you need to have an emergency glucagon injection kit available. Severe hypoglycemia may need to be treated in a hospital. The treatment may include getting glucose through an IV. You may also need treatment for the cause of your hypoglycemia. Follow these instructions at home:  General instructions  Take over-the-counter and prescription medicines only as told by your health care provider.  Monitor your blood glucose as told by your health care provider.  Limit alcohol intake to no more than 1 drink a day for nonpregnant women and 2 drinks a day for men. One drink equals 12 oz of beer (355 mL), 5 oz of wine (148 mL), or 1 oz of hard liquor (44 mL).  Keep all follow-up visits as told by your health care provider. This is important. If you have diabetes:  Always have a rapid-acting carbohydrate snack with you to treat low blood glucose.  Follow your diabetes management plan as directed. Make sure you: ? Know the symptoms of hypoglycemia. It is important to treat it right away to prevent it from becoming severe. ? Take your medicines as directed. ? Follow your exercise plan. ? Follow your meal plan. Eat on time, and do not skip meals. ? Check your blood glucose as often as directed. Always check before  and after exercise. ? Follow your sick day plan whenever you cannot eat or drink normally. Make this plan in advance with your health care provider.  Share your diabetes management plan with people in your workplace, school, and household.  Check your urine for ketones when you are ill and as told by your health care provider.  Carry a medical alert card or wear medical alert jewelry. Contact a health care provider if:  You have problems keeping your blood glucose in your target range.  You have frequent episodes of hypoglycemia. Get help right away if:  You continue to have hypoglycemia symptoms after eating or drinking something containing glucose.  Your blood glucose is at or below 54 mg/dL (3 mmol/L).  You have a seizure.  You faint. These symptoms may represent a serious problem that is an emergency. Do not wait to see if the symptoms will go away. Get medical help right away. Call your local emergency services (911 in the [place] Summary  Hypoglycemia occurs when the level of sugar (glucose) in the blood is  too low.  Hypoglycemia can happen in people who do or do not have diabetes. It can develop quickly, and it can be a medical emergency.  Make sure you know the symptoms of hypoglycemia and how to treat it.  Always have a rapid-acting carbohydrate snack with you to treat low blood sugar. This information is not intended to replace advice given to you by your health care provider. Make sure you discuss any questions you have with your health care provider. Document Revised: 12/01/2017 Document Reviewed: 07/14/2015 Elsevier Patient Education  [employer].    If you have lab work done today you will be contacted with your lab results within the next 2 weeks.  If you have not heard from [place] then please contact us. The fastest way to get your results is to register for My Chart.   IF you received an x-ray today, you will receive an invoice from [facility].  Please contact [facility] at [phone] with questions or concerns regarding your invoice.   IF you received labwork today, you will receive an invoice from [facility]. Please contact [facility] at [phone] with questions or concerns regarding your invoice.   Our billing staff will not be able to assist you with questions regarding bills from these companies.  You will be contacted with the lab results as soon as they are available. The fastest way to get your results is to activate your My Chart account. Instructions are located on the last page of this paperwork. If you have not heard from [place] regarding the results in 2 weeks, please contact this office.

## 2020-04-12 NOTE — Progress Notes (Signed)
Subjective:  Patient ID: Brendan Gonzales, male    DOB: Sep 22, 1955  Age: 64 y.o. MRN: [id number]  CC:  Chief Complaint  Patient presents with  . Medical Management of Chronic Issues    3 f/u   . Medication Refill    and blood work    Declines interpreter - Reynolds spoken.   HPI [name] presents for   Diabetes: Complicated with history of neuropathy, neuropathic symptoms had improved when discussed in February.  Treated with Metformin 1000 mg twice daily.  He is on statin, and ACE inhibitor. Microalbumin: Normal ratio 08/18/2019 Optho, foot exam, pneumovax: Due for ophthalmology exam Home readings - trouble with meter, not checking. Trying to get same meter - [employer] will order it.  Checked after eating one day  - 251.  Had low of 50 in September - not eaten much that day.  Last week 65, and 75 - ate normal that day, only 2 meals per day, but walking a lot and low reading was before next meal.  Felt sweaty.  No foot burning/dysesthesias.   Lab Results  Component Value Date   HGBA1C 8.2 (H) 08/18/2019   HGBA1C 8.8 (A) 07/06/2019   HGBA1C 7.8 (H) 07/22/2018   Lab Results  Component Value Date   MICROALBUR 3.9 10/02/2015   LDLCALC 101 (H) 08/18/2019   CREATININE 0.73 (L) 08/18/2019   Hypertension: Elevated in February. Added 21m lisinopril with lisinopril hctz 20/229mqd.  No missed doses recently.  Home readings: 161/97 as high - low 139/83.  BP Readings from Last 3 Encounters:  04/12/20 (!) 150/76  03/31/20 (!) 151/79  08/18/19 (!) 146/84   Lab Results  Component Value Date   CREATININE 0.73 (L) 08/18/2019   GERD: protonix 4057md.  Working well - with intermittent dosing at times.   Hyperlipidemia: lipitor 73m63m. No new side effects/myalgias.  Lab Results  Component Value Date   CHOL 168 08/18/2019   HDL 45 08/18/2019   LDLCALC 101 (H) 08/18/2019   TRIG 123 08/18/2019   CHOLHDL 3.7 08/18/2019   Lab Results  Component Value Date   ALT 19  08/18/2019   AST 20 08/18/2019   ALKPHOS 99 08/18/2019   BILITOT 0.3 08/18/2019       History Patient Active Problem List   Diagnosis Date Noted  . Hospice care patient 07/06/2019  . Eczema of hand 10/19/2014  . Type 2 diabetes mellitus (HCC)[place]/21/2014  . HTN (hypertension) 06/13/2013  . Normal coronary arteries- 2008 06/13/2013  . Chest pain with moderate risk of acute coronary syndrome 06/12/2013  . Special screening for malignant neoplasms, colon 06/27/2011  . Arthritis of knee 06/14/2011   Past Medical History:  Diagnosis Date  . Arthritis   . Constipation   . Diabetes mellitus    type 2  . Dizziness   . Headache(784.0)   . Hyperlipidemia   . Hypertension    benign   Past Surgical History:  Procedure Laterality Date  . COLONOSCOPY    . CORONARY ANGIOPLASTY    . HEMORRHOID SURGERY    . right femeroral bypass    . right knee surgery     Allergies  Allergen Reactions  . Metoprolol Diarrhea   Prior to Admission medications   Medication Sig Start Date End Date Taking? Authorizing Provider  acetaminophen (TYLENOL) 500 MG tablet Take 500 mg by mouth 3 (three) times daily as needed for mild pain.    Yes [provider]  aspirin 81 MG  tablet Take 81 mg by mouth every morning.    Yes [provider]  atorvastatin (LIPITOR) 10 MG tablet Take 1 tablet (10 mg total) by mouth daily. 08/18/19  Yes [name], MD  Calcium Carbonate Antacid (TUMS ULTRA 1000 PO) Take 1 tablet by mouth 3 (three) times daily as needed (indigestion).   Yes [provider]  Cyanocobalamin (VITAMIN B 12 PO) Take 1,000 mg by mouth daily.   Yes [provider]  cyclobenzaprine (FLEXERIL) 5 MG tablet Take 1 tablet (5 mg total) by mouth at bedtime as needed for muscle spasms. 03/31/20  Yes Volney American, PA-C  diclofenac Sodium (VOLTAREN) 1 % GEL Apply 2 g topically 4 (four) times daily. 03/31/20  Yes Volney American, PA-C  Garlic 7416 MG CAPS  Take 1,000 mg by mouth daily.    Yes [provider]  gentamicin ointment (GARAMYCIN) 0.1 % Apply 1 application topically 3 (three) times daily. 04/26/17  Yes Tanna Furry, MD  lisinopril (ZESTRIL) 10 MG tablet Take 1 tablet (10 mg total) by mouth daily. 08/18/19  Yes [name], MD  lisinopril-hydrochlorothiazide (ZESTORETIC) 20-25 MG tablet Take 1 tablet by mouth daily. 08/18/19  Yes [name], MD  metFORMIN (GLUCOPHAGE) 500 MG tablet Take 2 tablets (1,000 mg total) by mouth 2 (two) times daily. 08/18/19  Yes [name], MD  pantoprazole (PROTONIX) 40 MG tablet TAKE 1 TABLET BY MOUTH ONCE DAILY 08/18/19  Yes [name], MD  triamcinolone cream (KENALOG) 0.1 % Apply 1 application topically 2 (two) times daily. 07/22/18  Yes [name], MD  clotrimazole (LOTRIMIN) 1 % cream Apply 1 application topically 2 (two) times daily. Patient not taking: Reported on 04/12/2020 07/22/18   [name], MD  HYDROcodone-acetaminophen [facility]) 5-325 MG tablet Take 1 tablet by mouth every 6 (six) hours as needed. Patient not taking: Reported on 04/12/2020 07/07/19   [name], MD   Social History   Socioeconomic History  . Marital status: Married    Spouse name: Not on file  . Number of children: Not on file  . Years of education: Not on file  . Highest education level: Not on file  Occupational History  . Not on file  Tobacco Use  . Smoking status: Former Research scientist (life sciences)  . Smokeless tobacco: Never Used  Substance and Sexual Activity  . Alcohol use: No  . Drug use: No  . Sexual activity: Never  Other Topics Concern  . Not on file  Social History Narrative  . Not on file   Social Determinants of Health   Financial Resource Strain:   . Difficulty of Paying Living Expenses: Not on file  Food Insecurity:   . Worried About Charity fundraiser in the Last Year: Not on file  . Ran Out of Food in the Last Year: Not on file  Transportation Needs:   . Lack of  Transportation (Medical): Not on file  . Lack of Transportation (Non-Medical): Not on file  Physical Activity:   . Days of Exercise per Week: Not on file  . Minutes of Exercise per Session: Not on file  Stress:   . Feeling of Stress : Not on file  Social Connections:   . Frequency of Communication with Friends and Family: Not on file  . Frequency of Social Gatherings with Friends and Family: Not on file  . Attends Religious Services: Not on file  . Active Member of Clubs or Organizations: Not on  file  . Attends Archivist Meetings: Not on file  . Marital Status: Not on file  Intimate Partner Violence:   . Fear of Current or Ex-Partner: Not on file  . Emotionally Abused: Not on file  . Physically Abused: Not on file  . Sexually Abused: Not on file    Review of Systems  Constitutional: Negative for fatigue and unexpected weight change.  Eyes: Negative for visual disturbance.  Respiratory: Negative for cough, chest tightness and shortness of breath.   Cardiovascular: Negative for chest pain, palpitations and leg swelling.  Gastrointestinal: Negative for abdominal pain and blood in stool.  Neurological: Negative for dizziness, light-headedness and headaches.     Objective:   Vitals:   04/12/20 1140 04/12/20 1153  BP: (!) 150/80 (!) 150/76  Pulse: 62   Temp: 98.2 F (36.8 C)   TempSrc: Temporal   SpO2: 100%   Weight: 198 lb 6.4 oz (90 kg)   Height: '6\' 1"'  (1.854 m)      Physical Exam Vitals reviewed.  Constitutional:      Appearance: He is well-developed.  HENT:     Head: Normocephalic and atraumatic.  Eyes:     Pupils: Pupils are equal, round, and reactive to light.  Neck:     Vascular: No carotid bruit or JVD.  Cardiovascular:     Rate and Rhythm: Normal rate and regular rhythm.     Heart sounds: Normal heart sounds. No murmur heard.   Pulmonary:     Effort: Pulmonary effort is normal.     Breath sounds: Normal breath sounds. No rales.  Skin:     General: Skin is warm and dry.  Neurological:     Mental Status: He is alert and oriented to person, place, and time.    34 minutes spent during visit, greater than 50% counseling and assimilation of information, chart review, and discussion of plan.   Assessment & Plan:  Jairus Tonne is a 64 y.o. male . Type 2 diabetes mellitus with hypoglycemia without coma, without long-term current use of insulin (HCC) - Plan: Hemoglobin A1c  -Previous uncontrolled with A1c in the eights, does report some episodes of hypoglycemia but then also readings in the 200s.  Suspect hyperglycemia at times with decreased calories.  Stressed importance of regular meals, snacking between his 2 meals or third meal to lessen risk of hypoglycemia, handout given.  Continue Metformin same dose for now, check A1c, recheck 1 month.  RTC precautions.  Hyperlipidemia, unspecified hyperlipidemia type - Plan: CMP14+EGFR, Lipid panel  - Stable, tolerating current regimen. Medications refilled. Labs pending as above.   Essential hypertension - Plan: CMP14+EGFR, lisinopril-hydrochlorothiazide (ZESTORETIC) 20-12.5 MG tablet  -Decreased control, will adjust his Zestoretic to a total dose of 40/25 mg daily.  Check labs, 1 month follow-up.  Gastroesophageal reflux disease, unspecified whether esophagitis present - Plan: pantoprazole (PROTONIX) 40 MG tablet  -  Stable, tolerating current regimen. Medications refilled. Labs pending as above.    Meds ordered this encounter  Medications  . metFORMIN (GLUCOPHAGE) 500 MG tablet    Sig: Take 2 tablets (1,000 mg total) by mouth 2 (two) times daily.    Dispense:  360 tablet    Refill:  1  . pantoprazole (PROTONIX) 40 MG tablet    Sig: TAKE 1 TABLET BY MOUTH ONCE DAILY    Dispense:  90 tablet    Refill:  1  . lisinopril-hydrochlorothiazide (ZESTORETIC) 20-12.5 MG tablet    Sig: Take 2 tablets by mouth  daily.    Dispense:  180 tablet    Refill:  1   Patient Instructions     Try  to eat a 3rd meal per day or healthy snack between meals to lessen risk of low blood sugar. If low sugar returns, follow up in office to discuss other changes. Depending on a1c may need to make changes.  Return to the clinic or go to the nearest emergency room if any of your symptoms worsen or new symptoms occur.  Blood pressure still too high here today.  Change medication to 2 of the lisinopril HCTZ combo pills but at lower dose of HCTZ.  Should be a total of 40 mg lisinopril, 25 mg of hydrochlorothiazide.  Recheck in 1 month.   Hypoglycemia Hypoglycemia occurs when the level of sugar (glucose) in the blood is too low. Hypoglycemia can happen in people who do or do not have diabetes. It can develop quickly, and it can be a medical emergency. For most people with diabetes, a blood glucose level below 70 mg/dL (3.9 mmol/L) is considered hypoglycemia. Glucose is a type of sugar that provides the body's main source of energy. Certain hormones (insulin and glucagon) control the level of glucose in the blood. Insulin lowers blood glucose, and glucagon raises blood glucose. Hypoglycemia can result from having too much insulin in the bloodstream, or from not eating enough food that contains glucose. You may also have reactive hypoglycemia, which happens within 4 hours after eating a meal. What are the causes? Hypoglycemia occurs most often in people who have diabetes and may be caused by:  Diabetes medicine.  Not eating enough, or not eating often enough.  Increased physical activity.  Drinking alcohol on an empty stomach. If you do not have diabetes, hypoglycemia may be caused by:  A tumor in the pancreas.  Not eating enough, or not eating for long periods at a time (fasting).  A severe infection or illness.  Certain medicines. What increases the risk? Hypoglycemia is more likely to develop in:  People who have diabetes and take medicines to lower blood glucose.  People who abuse  alcohol.  People who have a severe illness. What are the signs or symptoms? Mild symptoms Mild hypoglycemia may not cause any symptoms. If you do have symptoms, they may include:  Hunger.  Anxiety.  Sweating and feeling clammy.  Dizziness or feeling light-headed.  Sleepiness.  Nausea.  Increased heart rate.  Headache.  Blurry vision.  Irritability.  Tingling or numbness around the mouth, lips, or tongue.  A change in coordination.  Restless sleep. Moderate symptoms Moderate hypoglycemia can cause:  Mental confusion and poor judgment.  Behavior changes.  Weakness.  Irregular heartbeat. Severe symptoms Severe hypoglycemia is a medical emergency. It can cause:  Fainting.  Seizures.  Loss of consciousness (coma).  Death. How is this diagnosed? Hypoglycemia is diagnosed with a blood test to measure your blood glucose level. This blood test is done while you are having symptoms. Your health care provider may also do a physical exam and review your medical history. How is this treated? This condition can often be treated by immediately eating or drinking something that contains sugar, such as:  Fruit juice, 4-6 oz (120-150 mL).  Regular soda (not diet soda), 4-6 oz (120-150 mL).  Low-fat milk, 4 oz (120 mL).  Several pieces of hard candy.  Sugar or honey, 1 Tbsp (15 mL). Treating hypoglycemia if you have diabetes If you are alert and able to swallow  safely, follow the 15:15 rule:  Take 15 grams of a rapid-acting carbohydrate. Talk with your health care provider about how much you should take.  Rapid-acting options include: ? Glucose pills (take 15 grams). ? 6-8 pieces of hard candy. ? 4-6 oz (120-150 mL) of fruit juice. ? 4-6 oz (120-150 mL) of regular (not diet) soda. ? 1 Tbsp (15 mL) honey or sugar.  Check your blood glucose 15 minutes after you take the carbohydrate.  If the repeat blood glucose level is still at or below 70 mg/dL (3.9  mmol/L), take 15 grams of a carbohydrate again.  If your blood glucose level does not increase above 70 mg/dL (3.9 mmol/L) after 3 tries, seek emergency medical care.  After your blood glucose level returns to normal, eat a meal or a snack within 1 hour.  Treating severe hypoglycemia Severe hypoglycemia is when your blood glucose level is at or below 54 mg/dL (3 mmol/L). Severe hypoglycemia is a medical emergency. Get medical help right away. If you have severe hypoglycemia and you cannot eat or drink, you may need an injection of glucagon. A family member or close friend should learn how to check your blood glucose and how to give you a glucagon injection. Ask your health care provider if you need to have an emergency glucagon injection kit available. Severe hypoglycemia may need to be treated in a hospital. The treatment may include getting glucose through an IV. You may also need treatment for the cause of your hypoglycemia. Follow these instructions at home:  General instructions  Take over-the-counter and prescription medicines only as told by your health care provider.  Monitor your blood glucose as told by your health care provider.  Limit alcohol intake to no more than 1 drink a day for nonpregnant women and 2 drinks a day for men. One drink equals 12 oz of beer (355 mL), 5 oz of wine (148 mL), or 1 oz of hard liquor (44 mL).  Keep all follow-up visits as told by your health care provider. This is important. If you have diabetes:  Always have a rapid-acting carbohydrate snack with you to treat low blood glucose.  Follow your diabetes management plan as directed. Make sure you: ? Know the symptoms of hypoglycemia. It is important to treat it right away to prevent it from becoming severe. ? Take your medicines as directed. ? Follow your exercise plan. ? Follow your meal plan. Eat on time, and do not skip meals. ? Check your blood glucose as often as directed. Always check before  and after exercise. ? Follow your sick day plan whenever you cannot eat or drink normally. Make this plan in advance with your health care provider.  Share your diabetes management plan with people in your workplace, school, and household.  Check your urine for ketones when you are ill and as told by your health care provider.  Carry a medical alert card or wear medical alert jewelry. Contact a health care provider if:  You have problems keeping your blood glucose in your target range.  You have frequent episodes of hypoglycemia. Get help right away if:  You continue to have hypoglycemia symptoms after eating or drinking something containing glucose.  Your blood glucose is at or below 54 mg/dL (3 mmol/L).  You have a seizure.  You faint. These symptoms may represent a serious problem that is an emergency. Do not wait to see if the symptoms will go away. Get medical help right away. Call  your local emergency services (911 in the [place] Summary  Hypoglycemia occurs when the level of sugar (glucose) in the blood is too low.  Hypoglycemia can happen in people who do or do not have diabetes. It can develop quickly, and it can be a medical emergency.  Make sure you know the symptoms of hypoglycemia and how to treat it.  Always have a rapid-acting carbohydrate snack with you to treat low blood sugar. This information is not intended to replace advice given to you by your health care provider. Make sure you discuss any questions you have with your health care provider. Document Revised: 12/01/2017 Document Reviewed: 07/14/2015 Elsevier Patient Education  [employer].    If you have lab work done today you will be contacted with your lab results within the next 2 weeks.  If you have not heard from [place] then please contact us. The fastest way to get your results is to register for My Chart.   IF you received an x-ray today, you will receive an invoice from [facility].  Please contact [facility] at [phone] with questions or concerns regarding your invoice.   IF you received labwork today, you will receive an invoice from [facility]. Please contact [facility] at [phone] with questions or concerns regarding your invoice.   Our billing staff will not be able to assist you with questions regarding bills from these companies.  You will be contacted with the lab results as soon as they are available. The fastest way to get your results is to activate your My Chart account. Instructions are located on the last page of this paperwork. If you have not heard from [place] regarding the results in 2 weeks, please contact this office.          Signed, [name], MD [facility]

## 2020-04-13 LAB — HEMOGLOBIN A1C
Est. average glucose Bld gHb Est-mCnc: 197 mg/dL
Hgb A1c MFr Bld: 8.5 % — ABNORMAL HIGH (ref 4.8–5.6)

## 2020-04-13 LAB — CMP14+EGFR
ALT: 12 IU/L (ref 0–44)
AST: 16 IU/L (ref 0–40)
Albumin/Globulin Ratio: 1.7 (ref 1.2–2.2)
Albumin: 4.5 g/dL (ref 3.8–4.8)
Alkaline Phosphatase: 102 IU/L (ref 44–121)
BUN/Creatinine Ratio: 14 (ref 10–24)
BUN: 9 mg/dL (ref 8–27)
Bilirubin Total: 0.5 mg/dL (ref 0.0–1.2)
CO2: 24 mmol/L (ref 20–29)
Calcium: 9 mg/dL (ref 8.6–10.2)
Chloride: 100 mmol/L (ref 96–106)
Creatinine, Ser: 0.65 mg/dL — ABNORMAL LOW (ref 0.76–1.27)
GFR calc Af Amer: 120 mL/min/{1.73_m2} (ref >59)
GFR calc non Af Amer: 104 mL/min/{1.73_m2} (ref >59)
Globulin, Total: 2.7 g/dL (ref 1.5–4.5)
Glucose: 134 mg/dL — ABNORMAL HIGH (ref 65–99)
Potassium: 4.1 mmol/L (ref 3.5–5.2)
Sodium: 138 mmol/L (ref 134–144)
Total Protein: 7.2 g/dL (ref 6.0–8.5)

## 2020-04-13 LAB — LIPID PANEL
Chol/HDL Ratio: 3.1 ratio (ref 0.0–5.0)
Cholesterol, Total: 142 mg/dL (ref 100–199)
HDL: 46 mg/dL (ref >39)
LDL Chol Calc (NIH): 78 mg/dL (ref 0–99)
Triglycerides: 94 mg/dL (ref 0–149)
VLDL Cholesterol Cal: 18 mg/dL (ref 5–40)

## 2020-05-01 LAB — HM DIABETES EYE EXAM

## 2020-05-08 ENCOUNTER — Ambulatory Visit (INDEPENDENT_AMBULATORY_CARE_PROVIDER_SITE_OTHER): Payer: 59

## 2020-05-08 ENCOUNTER — Ambulatory Visit (INDEPENDENT_AMBULATORY_CARE_PROVIDER_SITE_OTHER): Payer: 59 | Admitting: Family Medicine

## 2020-05-08 ENCOUNTER — Other Ambulatory Visit: Payer: Self-pay

## 2020-05-08 ENCOUNTER — Encounter: Payer: Self-pay | Admitting: Family Medicine

## 2020-05-08 VITALS — BP 130/88 | HR 65 | Temp 98.9°F | Ht 73.0 in | Wt 199.0 lb

## 2020-05-08 DIAGNOSIS — E11649 Type 2 diabetes mellitus with hypoglycemia without coma: Secondary | ICD-10-CM

## 2020-05-08 DIAGNOSIS — M545 Low back pain, unspecified: Secondary | ICD-10-CM | POA: Diagnosis not present

## 2020-05-08 DIAGNOSIS — I10 Essential (primary) hypertension: Secondary | ICD-10-CM

## 2020-05-08 NOTE — Patient Instructions (Addendum)
If you notice more readings in 200's, return to discuss changes. Make sure not to skip meals. No changes for now.   Blood pressure looks better today so no changes at this time.  See information below on back pain.  I will check an x-ray to make sure there are no acute concerns, but I suspect you have an overuse or strain of your low back.  Okay to continue Tylenol, Voltaren if that is helpful, muscle relaxant up to 3 times per day but that can cause sedation/sleepiness so may want to continue using that at night.  If your back pain is not improving within the next week to 10 days return and we can look at other treatment.   Return to the clinic or go to the nearest emergency room if any of your symptoms worsen or new symptoms occur.   Acute Back Pain, Adult Acute back pain is sudden and usually short-lived. It is often caused by an injury to the muscles and tissues in the back. The injury may result from:  A muscle or ligament getting overstretched or torn (strained). Ligaments are tissues that connect bones to each other. Lifting something improperly can cause a back strain.  Wear and tear (degeneration) of the spinal disks. Spinal disks are circular tissue that provides cushioning between the bones of the spine (vertebrae).  Twisting motions, such as while playing sports or doing yard work.  A hit to the back.  Arthritis. You may have a physical exam, lab tests, and imaging tests to find the cause of your pain. Acute back pain usually goes away with rest and home care. Follow these instructions at home: Managing pain, stiffness, and swelling  Take over-the-counter and prescription medicines only as told by your health care provider.  Your health care provider may recommend applying ice during the first 24-48 hours after your pain starts. To do this: ? Put ice in a plastic bag. ? Place a towel between your skin and the bag. ? Leave the ice on for 20 minutes, 2-3 times a day.  If  directed, apply heat to the affected area as often as told by your health care provider. Use the heat source that your health care provider recommends, such as a moist heat pack or a heating pad. ? Place a towel between your skin and the heat source. ? Leave the heat on for 20-30 minutes. ? Remove the heat if your skin turns bright red. This is especially important if you are unable to feel pain, heat, or cold. You have a greater risk of getting burned. Activity   Do not stay in bed. Staying in bed for more than 1-2 days can delay your recovery.  Sit up and stand up straight. Avoid leaning forward when you sit, or hunching over when you stand. ? If you work at a desk, sit close to it so you do not need to lean over. Keep your chin tucked in. Keep your neck drawn back, and keep your elbows bent at a right angle. Your arms should look like the letter "L." ? Sit high and close to the steering wheel when you drive. Add lower back (lumbar) support to your car seat, if needed.  Take short walks on even surfaces as soon as you are able. Try to increase the length of time you walk each day.  Do not sit, drive, or stand in one place for more than 30 minutes at a time. Sitting or standing for long periods  of time can put stress on your back.  Do not drive or use heavy machinery while taking prescription pain medicine.  Use proper lifting techniques. When you bend and lift, use positions that put less stress on your back: ? Sedan your knees. ? Keep the load close to your body. ? Avoid twisting.  Exercise regularly as told by your health care provider. Exercising helps your back heal faster and helps prevent back injuries by keeping muscles strong and flexible.  Work with a physical therapist to make a safe exercise program, as recommended by your health care provider. Do any exercises as told by your physical therapist. Lifestyle  Maintain a healthy weight. Extra weight puts stress on your back and  makes it difficult to have good posture.  Avoid activities or situations that make you feel anxious or stressed. Stress and anxiety increase muscle tension and can make back pain worse. Learn ways to manage anxiety and stress, such as through exercise. General instructions  Sleep on a firm mattress in a comfortable position. Try lying on your side with your knees slightly bent. If you lie on your back, put a pillow under your knees.  Follow your treatment plan as told by your health care provider. This may include: ? Cognitive or behavioral therapy. ? Acupuncture or massage therapy. ? Meditation or yoga. Contact a health care provider if:  You have pain that is not relieved with rest or medicine.  You have increasing pain going down into your legs or buttocks.  Your pain does not improve after 2 weeks.  You have pain at night.  You lose weight without trying.  You have a fever or chills. Get help right away if:  You develop new bowel or bladder control problems.  You have unusual weakness or numbness in your arms or legs.  You develop nausea or vomiting.  You develop abdominal pain.  You feel faint. Summary  Acute back pain is sudden and usually short-lived.  Use proper lifting techniques. When you bend and lift, use positions that put less stress on your back.  Take over-the-counter and prescription medicines and apply heat or ice as directed by your health care provider. This information is not intended to replace advice given to you by your health care provider. Make sure you discuss any questions you have with your health care provider. Document Revised: 09/29/2018 Document Reviewed: 01/22/2017 Elsevier Patient Education  [employer].    If you have lab work done today you will be contacted with your lab results within the next 2 weeks.  If you have not heard from [place] then please contact us. The fastest way to get your results is to register for My  Chart.   IF you received an x-ray today, you will receive an invoice from [facility]. Please contact [facility] at [phone] with questions or concerns regarding your invoice.   IF you received labwork today, you will receive an invoice from [place]. Please contact [facility] at [phone] with questions or concerns regarding your invoice.   Our billing staff will not be able to assist you with questions regarding bills from these companies.  You will be contacted with the lab results as soon as they are available. The fastest way to get your results is to activate your My Chart account. Instructions are located on the last page of this paperwork. If you have not heard from [place] regarding the results in 2 weeks, please contact this office.

## 2020-05-08 NOTE — Progress Notes (Signed)
Subjective:  Patient ID: Brendan Gonzales, male    DOB: 1955/06/29  Age: 64 y.o. MRN: [id number]  CC:  Chief Complaint  Patient presents with   Follow-up    on hypertension and diabetes. Pt reports no issues with this conditions since last OV. pt has bought a new BP cuff for at home BP checks.   Back Pain    Pt reports pain in the R side of his lower Back the pain is radiating to his R hip now.  Pain started 3 weeks ago and hip pain started last friday. pt reports no tramma to the area, but pt states that he sometimes has to lift heavy things at work.     HPI Brendan Gonzales presents for   Diabetes: Complicated with history of neuropathy, hypoglycemia and hyperglycemia discussed October 20.  Stressed importance of regular meals to minimize risk of hypoglycemia.  Due to this hypoglycemic events he was continued on same dose of Metformin, even with elevated A1c 8.5 Home readings: Fasting: 110 Postprandial: 177.  No further low readings. No 200's.  Trying to eat more regularly.   Lab Results  Component Value Date   HGBA1C 8.5 (H) 04/12/2020   HGBA1C 8.2 (H) 08/18/2019   HGBA1C 8.8 (A) 07/06/2019   Lab Results  Component Value Date   MICROALBUR 3.9 10/02/2015   LDLCALC 78 04/12/2020   CREATININE 0.65 (L) 04/12/2020    Hypertension: Decreased control in October visit, Zestoretic was adjusted to a total dose of 40/25 mg daily.  Improved today. No new side effects on new dose.  Home readings: 124/83 BP Readings from Last 3 Encounters:  05/08/20 130/88  04/12/20 (!) 150/76  03/31/20 (!) 151/79   Lab Results  Component Value Date   CREATININE 0.65 (L) 04/12/2020   Right low back pain Started 3 weeks ago, right-sided with radiation to hip at times- noticed since Friday.  No known injury but does have some heavy lifting at work- heavy work. 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 More heavy work last week.  Lumbar spine x-ray in 2018 without acute osseous abnormality.   No loss of vertebral body height or disc height at that time.  Minimal endplate spurring No bowel or bladder incontinence, no saddle anesthesia, no lower extremity weakness, but using cane due to pain. Concerned about using cane at work.  Tx: tylenol, voltaren - few times per day. Some relief. Flexeril 2 days ago. Some relief.  Lighter work next 2 weeks. Able to have someone else do heavier lifting if needed.  Feels same today as few days ago.   Seen in Urgent Care for back pain.treated with lidocaine patch (not filled), voltaren gel, flexeril.   History Patient Active Problem List   Diagnosis Date Noted   Hospice care patient 07/06/2019   Eczema of hand 10/19/2014   Type 2 diabetes mellitus ([place]) 06/13/2013   HTN (hypertension) 06/13/2013   Normal coronary arteries- 2008 06/13/2013   Chest pain with moderate risk of acute coronary syndrome 06/12/2013   Special screening for malignant neoplasms, colon 06/27/2011   Arthritis of knee 06/14/2011   Past Medical History:  Diagnosis Date   Arthritis    Constipation    Diabetes mellitus    type 2   Dizziness    Headache(784.0)    Hyperlipidemia    Hypertension    benign   Past Surgical History:  Procedure Laterality Date   COLONOSCOPY     CORONARY ANGIOPLASTY  HEMORRHOID SURGERY     right femeroral bypass     right knee surgery     Allergies  Allergen Reactions   Metoprolol Diarrhea   Prior to Admission medications   Medication Sig Start Date End Date Taking? Authorizing Provider  acetaminophen (TYLENOL) 500 MG tablet Take 500 mg by mouth 3 (three) times daily as needed for mild pain.     [provider]  aspirin 81 MG tablet Take 81 mg by mouth every morning.     [provider]  atorvastatin (LIPITOR) 10 MG tablet Take 1 tablet (10 mg total) by mouth daily. 08/18/19   [name], MD  Calcium Carbonate Antacid (TUMS ULTRA 1000 PO) Take 1 tablet by mouth 3 (three) times daily as  needed (indigestion).    [provider]  clotrimazole (LOTRIMIN) 1 % cream Apply 1 application topically 2 (two) times daily. Patient not taking: Reported on 04/12/2020 07/22/18   [name], MD  Cyanocobalamin (VITAMIN B 12 PO) Take 1,000 mg by mouth daily.    [provider]  cyclobenzaprine (FLEXERIL) 5 MG tablet Take 1 tablet (5 mg total) by mouth at bedtime as needed for muscle spasms. 03/31/20   Volney American, PA-C  diclofenac Sodium (VOLTAREN) 1 % GEL Apply 2 g topically 4 (four) times daily. 03/31/20   Volney American, PA-C  Garlic 4132 MG CAPS Take 1,000 mg by mouth daily.     [provider]  gentamicin ointment (GARAMYCIN) 0.1 % Apply 1 application topically 3 (three) times daily. 04/26/17   Tanna Furry, MD  HYDROcodone-acetaminophen (NORCO) 5-325 MG tablet Take 1 tablet by mouth every 6 (six) hours as needed. Patient not taking: Reported on 04/12/2020 07/07/19   [name], MD  lisinopril-hydrochlorothiazide (ZESTORETIC) 20-12.5 MG tablet Take 2 tablets by mouth daily. 04/12/20   [name], MD  metFORMIN (GLUCOPHAGE) 500 MG tablet Take 2 tablets (1,000 mg total) by mouth 2 (two) times daily. 04/12/20   [name], MD  pantoprazole (PROTONIX) 40 MG tablet TAKE 1 TABLET BY MOUTH ONCE DAILY 04/12/20   [name], MD  triamcinolone cream (KENALOG) 0.1 % Apply 1 application topically 2 (two) times daily. 07/22/18   [name], MD   Social History   Socioeconomic History   Marital status: Married    Spouse name: Not on file   Number of children: Not on file   Years of education: Not on file   Highest education level: Not on file  Occupational History   Not on file  Tobacco Use   Smoking status: Former Smoker   Smokeless tobacco: Never Used  Substance and Sexual Activity   Alcohol use: No   Drug use: No   Sexual activity: Never  Other Topics Concern   Not on file  Social History  Narrative   Not on file   Social Determinants of Health   Financial Resource Strain:    Difficulty of Paying Living Expenses: Not on file  Food Insecurity:    Worried About [place] in the Last Year: Not on file   Ran Out of Food in the Last Year: Not on file  Transportation Needs:    Lack of Transportation (Medical): Not on file   Lack of Transportation (Non-Medical): Not on file  Physical Activity:    Days of Exercise per Week: Not on file   Minutes of Exercise per Session: Not on file  Stress:  Feeling of Stress : Not on file  Social Connections:    Frequency of Communication with Friends and Family: Not on file   Frequency of Social Gatherings with Friends and Family: Not on file   Attends Religious Services: Not on file   Active Member of Clubs or Organizations: Not on file   Attends Archivist Meetings: Not on file   Marital Status: Not on file  Intimate Partner Violence:    Fear of Current or Ex-Partner: Not on file   Emotionally Abused: Not on file   Physically Abused: Not on file   Sexually Abused: Not on file    Review of Systems  Per HPI.  Objective:   Vitals:   05/08/20 1040 05/08/20 1044  BP: (!) 143/82 130/88  Pulse: 65   Temp: 98.9 F (37.2 C)   TempSrc: Temporal   SpO2: 97%   Weight: 199 lb (90.3 kg)   Height: 6\' 1"  (1.854 m)      Physical Exam Vitals reviewed.  Constitutional:      Appearance: He is well-developed.  HENT:     Head: Normocephalic and atraumatic.  Eyes:     Pupils: Pupils are equal, round, and reactive to light.  Neck:     Vascular: No carotid bruit or JVD.  Cardiovascular:     Rate and Rhythm: Normal rate and regular rhythm.     Heart sounds: Normal heart sounds. No murmur heard.   Pulmonary:     Effort: Pulmonary effort is normal.     Breath sounds: Normal breath sounds. No rales.  Musculoskeletal:     Comments: Right hip, pain-free internal and external rotation seated.   Discomfort in the posterior hip/buttock area only.  Lumbar spine Slight decreased left lateral flexion, flexion.  Otherwise range of motion intact.  Negative seated straight leg raise with discomfort into the lumbar spine only.  Able to heel and toe walk without difficulty.  Skin:    General: Skin is warm and dry.  Neurological:     Mental Status: He is alert and oriented to person, place, and time.        Assessment & Plan:  Brendan Gonzales is a 64 y.o. male . Essential hypertension  -Improved control on current regimen, continue same.  Type 2 diabetes mellitus with hypoglycemia without coma, without long-term current use of insulin ([place])  -Uncontrolled by prior A1c but with episodes of hypoglycemia decided to remain on same regimen.  Improved control and diet currently.  Avoid changes for now, repeat A1c in 2 months, RTC precautions if higher readings during that time.  Right-sided low back pain without sciatica, unspecified chronicity - Plan: DG Lumbar Spine Complete  -with history appears to have recurrent low back pain that improves with time.  Some increased lifting/work last week attributed to his worsening symptoms.  Plan for lab work the next 2 weeks.  Check imaging, but no red flags on exam.  Continue muscle relaxants as needed, topical Voltaren if needed although may need different anti-inflammatory.  Tylenol, and handout given.  RTC precautions if not improving this week to 10 days.  No orders of the defined types were placed in this encounter.  Patient Instructions    If you notice more readings in 200's, return to discuss changes. Make sure not to skip meals. No changes for now.   Blood pressure looks better today so no changes at this time.  See information below on back pain.  I will check an x-ray  to make sure there are no acute concerns, but I suspect you have an overuse or strain of your low back.  Okay to continue Tylenol, Voltaren if that is helpful, muscle  relaxant up to 3 times per day but that can cause sedation/sleepiness so may want to continue using that at night.  If your back pain is not improving within the next week to 10 days return and we can look at other treatment.   Return to the clinic or go to the nearest emergency room if any of your symptoms worsen or new symptoms occur.   Acute Back Pain, Adult Acute back pain is sudden and usually short-lived. It is often caused by an injury to the muscles and tissues in the back. The injury may result from:  A muscle or ligament getting overstretched or torn (strained). Ligaments are tissues that connect bones to each other. Lifting something improperly can cause a back strain.  Wear and tear (degeneration) of the spinal disks. Spinal disks are circular tissue that provides cushioning between the bones of the spine (vertebrae).  Twisting motions, such as while playing sports or doing yard work.  A hit to the back.  Arthritis. You may have a physical exam, lab tests, and imaging tests to find the cause of your pain. Acute back pain usually goes away with rest and home care. Follow these instructions at home: Managing pain, stiffness, and swelling  Take over-the-counter and prescription medicines only as told by your health care provider.  Your health care provider may recommend applying ice during the first 24-48 hours after your pain starts. To do this: ? Put ice in a plastic bag. ? Place a towel between your skin and the bag. ? Leave the ice on for 20 minutes, 2-3 times a day.  If directed, apply heat to the affected area as often as told by your health care provider. Use the heat source that your health care provider recommends, such as a moist heat pack or a heating pad. ? Place a towel between your skin and the heat source. ? Leave the heat on for 20-30 minutes. ? Remove the heat if your skin turns bright red. This is especially important if you are unable to feel pain, heat, or  cold. You have a greater risk of getting burned. Activity   Do not stay in bed. Staying in bed for more than 1-2 days can delay your recovery.  Sit up and stand up straight. Avoid leaning forward when you sit, or hunching over when you stand. ? If you work at a desk, sit close to it so you do not need to lean over. Keep your chin tucked in. Keep your neck drawn back, and keep your elbows bent at a right angle. Your arms should look like the letter "L." ? Sit high and close to the steering wheel when you drive. Add lower back (lumbar) support to your car seat, if needed.  Take short walks on even surfaces as soon as you are able. Try to increase the length of time you walk each day.  Do not sit, drive, or stand in one place for more than 30 minutes at a time. Sitting or standing for long periods of time can put stress on your back.  Do not drive or use heavy machinery while taking prescription pain medicine.  Use proper lifting techniques. When you bend and lift, use positions that put less stress on your back: ? Bishop your knees. ? Keep  the load close to your body. ? Avoid twisting.  Exercise regularly as told by your health care provider. Exercising helps your back heal faster and helps prevent back injuries by keeping muscles strong and flexible.  Work with a physical therapist to make a safe exercise program, as recommended by your health care provider. Do any exercises as told by your physical therapist. Lifestyle  Maintain a healthy weight. Extra weight puts stress on your back and makes it difficult to have good posture.  Avoid activities or situations that make you feel anxious or stressed. Stress and anxiety increase muscle tension and can make back pain worse. Learn ways to manage anxiety and stress, such as through exercise. General instructions  Sleep on a firm mattress in a comfortable position. Try lying on your side with your knees slightly bent. If you lie on your back,  put a pillow under your knees.  Follow your treatment plan as told by your health care provider. This may include: ? Cognitive or behavioral therapy. ? Acupuncture or massage therapy. ? Meditation or yoga. Contact a health care provider if:  You have pain that is not relieved with rest or medicine.  You have increasing pain going down into your legs or buttocks.  Your pain does not improve after 2 weeks.  You have pain at night.  You lose weight without trying.  You have a fever or chills. Get help right away if:  You develop new bowel or bladder control problems.  You have unusual weakness or numbness in your arms or legs.  You develop nausea or vomiting.  You develop abdominal pain.  You feel faint. Summary  Acute back pain is sudden and usually short-lived.  Use proper lifting techniques. When you bend and lift, use positions that put less stress on your back.  Take over-the-counter and prescription medicines and apply heat or ice as directed by your health care provider. This information is not intended to replace advice given to you by your health care provider. Make sure you discuss any questions you have with your health care provider. Document Revised: 09/29/2018 Document Reviewed: 01/22/2017 Elsevier Patient Education  [employer].    If you have lab work done today you will be contacted with your lab results within the next 2 weeks.  If you have not heard from [place] then please contact us. The fastest way to get your results is to register for My Chart.   IF you received an x-ray today, you will receive an invoice from [facility]. Please contact [facility] at [phone] with questions or concerns regarding your invoice.   IF you received labwork today, you will receive an invoice from [place]. Please contact [facility] at [phone] with questions or concerns regarding your invoice.   Our billing staff will not be able to assist  you with questions regarding bills from these companies.  You will be contacted with the lab results as soon as they are available. The fastest way to get your results is to activate your My Chart account. Instructions are located on the last page of this paperwork. If you have not heard from [place] regarding the results in 2 weeks, please contact this office.         Signed, [name], MD [facility]

## 2020-06-12 ENCOUNTER — Telehealth: Payer: Self-pay | Admitting: Family Medicine

## 2020-06-12 NOTE — Telephone Encounter (Signed)
Pt came in a dropped off renewal of disability parking place card. Paperwork will be in providers red box.Pt is aware that provider is off today 12/20 until Monday 12/27 and provider will not get to this until he gets back. Please advise.

## 2020-06-13 NOTE — Telephone Encounter (Signed)
FYI so you are aware when you return that this disability parking form is waiting for you when you do return thank you!

## 2020-06-13 NOTE — Telephone Encounter (Signed)
I am fine completing that once I get back unless he needs sooner and can have other provider sign. Thanks.

## 2020-06-19 ENCOUNTER — Telehealth: Payer: Self-pay | Admitting: Family Medicine

## 2020-06-19 NOTE — Telephone Encounter (Signed)
patient dropped off parking placard DMV / patient would like a phone call when ready for pick up   [phone]

## 2020-06-20 NOTE — Telephone Encounter (Signed)
Sure thing. Can have it done by lunch.  Thanks,  Luan Pulling

## 2020-06-20 NOTE — Telephone Encounter (Signed)
I set this on your computer whenever you have the chance thank you so much!

## 2020-06-20 NOTE — Telephone Encounter (Signed)
Would you be willing to sign this as Dr Neva Seat is out of office?

## 2020-06-27 ENCOUNTER — Other Ambulatory Visit: Payer: Self-pay | Admitting: Family Medicine

## 2020-06-27 DIAGNOSIS — E11649 Type 2 diabetes mellitus with hypoglycemia without coma: Secondary | ICD-10-CM

## 2020-06-27 DIAGNOSIS — E785 Hyperlipidemia, unspecified: Secondary | ICD-10-CM

## 2020-07-12 ENCOUNTER — Ambulatory Visit: Payer: 59 | Admitting: Family Medicine

## 2020-07-12 ENCOUNTER — Encounter: Payer: Self-pay | Admitting: Family Medicine

## 2020-07-12 ENCOUNTER — Other Ambulatory Visit: Payer: Self-pay

## 2020-07-12 VITALS — BP 122/77 | HR 62 | Temp 98.7°F | Ht 73.0 in | Wt 201.0 lb

## 2020-07-12 DIAGNOSIS — E11649 Type 2 diabetes mellitus with hypoglycemia without coma: Secondary | ICD-10-CM | POA: Diagnosis not present

## 2020-07-12 DIAGNOSIS — I10 Essential (primary) hypertension: Secondary | ICD-10-CM

## 2020-07-12 DIAGNOSIS — N529 Male erectile dysfunction, unspecified: Secondary | ICD-10-CM

## 2020-07-12 DIAGNOSIS — R5383 Other fatigue: Secondary | ICD-10-CM

## 2020-07-12 MED ORDER — SILDENAFIL CITRATE 50 MG PO TABS: 25.0000 mg | ORAL_TABLET | Freq: Every day | ORAL | 5 refills | Status: DC | PRN

## 2020-07-12 NOTE — Progress Notes (Signed)
Subjective:  Patient ID: Brendan Gonzales, male    DOB: Jul 21, 1955  Age: 65 y.o. MRN: [id number]  CC:  Chief Complaint  Patient presents with  . Medical Management of Chronic Issues    3 m f/u   . Hypertension  . Diabetes    HPI Brendan Gonzales presents for   Diabetes: Complicated by history of neuropathy, prior hypoglycemia, but still uncontrolled with A1c of 8.5 in October.  Stressed importance of regular meals, snacking between meals if needed to lessen risk of hypoglycemia and was continued on same dose of metformin.  1 month follow-up in November was doing better.  recommended. Only taking 1 metformin at night at times few days per week if not eating very much. usually 2 pills bid - 569m.  Stable LDL with Lipitor in October. He is on ACE inhibitor. Microalbumin: Normal ratio February 2021 Optho, foot exam, pneumovax: Up-to-date. No recent lows, not since last visit. . Lowest 117-118 fasting.  No 200's. No new side effects with meds.  Some fatigue - longstanding.  Denies depression.  Difficulty with maintaining erection at times, but able to obtain erection. No prior meds.  No chest pain with exertion. Echo in 2021 with normal EF.    [facility] 07/12/2020 04/12/2020 08/18/2019 07/06/2019 07/22/2018  Decreased Interest 0 0 - 0 0  Down, Depressed, Hopeless 0 0 0 0 0  PHQ - 2 Score 0 0 0 0 0    Lab Results  Component Value Date   HGBA1C 8.5 (H) 04/12/2020   HGBA1C 8.2 (H) 08/18/2019   HGBA1C 8.8 (A) 07/06/2019   Lab Results  Component Value Date   MICROALBUR 3.9 10/02/2015   LDLCALC 78 04/12/2020   CREATININE 0.65 (L) 04/12/2020   Hypertension: Decreased control in October, adjusting Zestoretic to 40/25 mg total dose per day. Rare dizziness with standing up quickly form floor. No syncope. Water intake- 4-5 bottles per day - physical work.  Home readings: 130/80.  No CP/dyspena.  Fatigue for years, no recent changes.   BP Readings from Last 3 Encounters:   07/12/20 122/77  05/08/20 130/88  04/12/20 (!) 150/76   Lab Results  Component Value Date   CREATININE 0.65 (L) 04/12/2020     History Patient Active Problem List   Diagnosis Date Noted  . Hospice care patient 07/06/2019  . Eczema of hand 10/19/2014  . [place] 06/13[place]  . HTN (hypertension) 06/13/2013  . Normal coronary arteries- 2008 06/13/2013  . Chest pain with moderate risk of acute coronary syndrome 06/12/2013  . Special screening for malignant neoplasms, colon 06/27/2011  . Arthritis of knee 06/14/2011   Past Medical History:  Diagnosis Date  . Arthritis   . Constipation   . Diabetes mellitus    type 2  . Dizziness   . Headache(784.0)   . Hyperlipidemia   . Hypertension    benign   Past Surgical History:  Procedure Laterality Date  . COLONOSCOPY    . CORONARY ANGIOPLASTY    . HEMORRHOID SURGERY    . right femeroral bypass    . right knee surgery     Allergies  Allergen Reactions  . Metoprolol Diarrhea   Prior to Admission medications   Medication Sig Start Date End Date Taking? Authorizing Provider  acetaminophen (TYLENOL) 500 MG tablet Take 500 mg by mouth 3 (three) times daily as needed for mild pain.    Yes [provider]  aspirin 81 MG tablet Take 81  mg by mouth every morning.   Yes [provider]  atorvastatin (LIPITOR) 10 MG tablet Take 1 tablet by mouth once daily 06/27/20  Yes [name], MD  Calcium Carbonate Antacid (TUMS ULTRA 1000 PO) Take 1 tablet by mouth 3 (three) times daily as needed (indigestion).   Yes [provider]  Cyanocobalamin (VITAMIN B 12 PO) Take 1,000 mg by mouth daily.   Yes [provider]  cyclobenzaprine (FLEXERIL) 5 MG tablet Take 1 tablet (5 mg total) by mouth at bedtime as needed for muscle spasms. 03/31/20  Yes Volney American, PA-C  diclofenac Sodium (VOLTAREN) 1 % GEL Apply 2 g topically 4 (four) times daily. 03/31/20  Yes Volney American, PA-C  Garlic 5427 MG CAPS Take 1,000 mg by mouth daily.    Yes [provider]  gentamicin ointment (GARAMYCIN) 0.1 % Apply 1 application topically 3 (three) times daily. 04/26/17  Yes Tanna Furry, MD  lisinopril-hydrochlorothiazide (ZESTORETIC) 20-12.5 MG tablet Take 2 tablets by mouth daily. 04/12/20  Yes [name], MD  metFORMIN (GLUCOPHAGE) 500 MG tablet Take 2 tablets by mouth twice daily 06/27/20  Yes [name], MD  pantoprazole (PROTONIX) 40 MG tablet TAKE 1 TABLET BY MOUTH ONCE DAILY 04/12/20  Yes [name], MD  triamcinolone cream (KENALOG) 0.1 % Apply 1 application topically 2 (two) times daily. 07/22/18  Yes [name], MD   Social History   Socioeconomic History  . Marital status: Married    Spouse name: Not on file  . Number of children: Not on file  . Years of education: Not on file  . Highest education level: Not on file  Occupational History  . Not on file  Tobacco Use  . Smoking status: Former Research scientist (life sciences)  . Smokeless tobacco: Never Used  Substance and Sexual Activity  . Alcohol use: No  . Drug use: No  . Sexual activity: Never  Other Topics Concern  . Not on file  Social History Narrative  . Not on file   Social Determinants of Health   Financial Resource Strain: Not on file  Food Insecurity: Not on file  Transportation Needs: Not on file  Physical Activity: Not on file  Stress: Not on file  Social Connections: Not on file  Intimate Partner Violence: Not on file    Review of Systems  Constitutional: Positive for fatigue. Negative for unexpected weight change.  Eyes: Negative for visual disturbance.  Respiratory: Negative for cough, chest tightness and shortness of breath.   Cardiovascular: Negative for chest pain, palpitations and leg swelling.  Gastrointestinal: Negative for abdominal pain and blood in stool.  Neurological: Negative for dizziness, light-headedness and headaches.     Objective:   Vitals:    07/12/20 0904  BP: 122/77  Pulse: 62  Temp: 98.7 F (37.1 C)  TempSrc: Temporal  SpO2: 99%  Weight: 201 lb (91.2 kg)  Height: '6\' 1"'  (1.854 m)     Physical Exam Vitals reviewed.  Constitutional:      Appearance: He is well-developed and well-nourished.  HENT:     Head: Normocephalic and atraumatic.  Eyes:     Extraocular Movements: EOM normal.     Pupils: Pupils are equal, round, and reactive to light.  Neck:     Vascular: No carotid bruit or JVD.  Cardiovascular:     Rate and Rhythm: Normal rate and regular rhythm.     Heart sounds: Normal heart sounds. No murmur heard.   Pulmonary:  Effort: Pulmonary effort is normal.     Breath sounds: Normal breath sounds. No rales.  Musculoskeletal:        General: No edema.  Skin:    General: Skin is warm and dry.  Neurological:     Mental Status: He is alert and oriented to person, place, and time.  Psychiatric:        Mood and Affect: Mood and affect normal.     Assessment & Plan:  Brendan Gonzales is a 65 y.o. male . Type 2 diabetes mellitus with hypoglycemia without coma, without long-term current use of insulin ([place]) - Plan: Hemoglobin A1c  -Uncontrolled by previous A1c but was having hypoglycemia symptoms.  Denies recent hypoglycemia.  Likely will need to maintain metformin at 1000 mg twice daily, not skipping half dose at night but will check A1c first.  Fatigue, unspecified type - Plan: CBC, Testosterone, Basic metabolic panel  -Longstanding, denies recent changes, potentially could be related to fluid intake/diet.  Recommendations given, RTC precautions given.  Erectile dysfunction, unspecified erectile dysfunction type - Plan: Testosterone, sildenafil (VIAGRA) 50 MG tablet  - viagra Rx given - use lowest effective dose. Side effects discussed (including but not limited to headache/flushing, blue discoloration of vision, possible vascular steal and risk of cardiac effects if underlying unknown coronary artery  disease, and permanent sensorineural hearing loss). Understanding expressed.  -Check testosterone.  Essential hypertension - Plan: Basic metabolic panel  -Stable, controlled reading in office as well as home readings.  No reported low readings..  RTC precautions if continued orthostatic symptoms but may be related to fluid intake.  Meds ordered this encounter  Medications  . sildenafil (VIAGRA) 50 MG tablet    Sig: Take 0.5-1 tablets (25-50 mg total) by mouth daily as needed for erectile dysfunction.    Dispense:  10 tablet    Refill:  5   Patient Instructions   Make sure you are obtaining at least 64 ounces of water per day.  If any continued lightheadedness with standing, follow-up so we can discuss if medication changes are needed.  I am checking some lab work for the fatigue, but I would also like to see that improves with increased fluid intake.  Make sure you are eating regular meals, snacks in between as needed.  I will check hemoglobin A1c again today, but no medication changes.  Likely will need to take metformin 2 pills twice per day every day.  We will look at lab work first.  Viagra lowest effective dose for erectile dysfunction.  If any change in vision, hearing or new side effects let me know right away.  Follow-up in 3 months, sooner if fatigue is not improving or other new symptoms.    Erectile Dysfunction Erectile dysfunction (ED) is the inability to get or keep an erection in order to have sexual intercourse. ED is considered a symptom of an underlying disorder and not considered a disease. Erectile dysfunction may include:  Inability to get an erection.  Lack of enough hardness of the erection to allow penetration.  Loss of the erection before sex is finished. What are the causes? This condition may be caused by:  Certain medicines, such as: ? Pain relievers. ? Antihistamines. ? Antidepressants. ? Blood pressure medicines. ? Water pills (diuretics). ? Ulcer  medicines. ? Muscle relaxants. ? Drugs.  Excessive drinking.  Psychological causes, such as: ? Anxiety. ? Depression. ? Sadness. ? Exhaustion. ? Performance fear. ? Stress.  Physical causes, such as: ? Artery problems.  This may include diabetes, smoking, liver disease, or atherosclerosis. ? High blood pressure. ? Hormonal problems, such as low testosterone. ? Obesity. ? Nerve problems. This may include back or pelvic injuries, diabetes mellitus, multiple sclerosis, or Parkinson's disease. What are the signs or symptoms? Symptoms of this condition include:  Inability to get an erection.  Lack of enough hardness of the erection to allow penetration.  Loss of the erection before sex is finished.  Normal erections at some times, but with frequent unsatisfactory episodes.  Low sexual satisfaction in either partner due to erection problems.  A curved penis occurring with erection. The curve may cause pain or the penis may be too curved to allow for intercourse.  Never having nighttime erections. How is this diagnosed? This condition is often diagnosed by:  Performing a physical exam to find other diseases or specific problems with the penis.  Asking you detailed questions about the problem.  Performing blood tests to check for diabetes mellitus or to measure hormone levels.  Performing other tests to check for underlying health conditions.  Performing an ultrasound exam to check for scarring.  Performing a test to check blood flow to the penis.  Doing a sleep study at home to measure nighttime erections. How is this treated? This condition may be treated by:  Medicine taken by mouth to help you achieve an erection (oral medicine).  Hormone replacement therapy to replace low testosterone levels.  Medicine that is injected into the penis. Your health care provider may instruct you how to give yourself these injections at home.  Vacuum pump. This is a pump with a  ring on it. The pump and ring are placed on the penis and used to create pressure that helps the penis become erect.  Penile implant surgery. In this procedure, you may receive: ? An inflatable implant. This consists of cylinders, a pump, and a reservoir. The cylinders can be inflated with a fluid that helps to create an erection, and they can be deflated after intercourse. ? A semi-rigid implant. This consists of two silicone rubber rods. The rods provide some rigidity. They are also flexible, so the penis can both curve downward in its normal position and become straight for sexual intercourse.  Blood vessel surgery, to improve blood flow to the penis. During this procedure, a blood vessel from a different part of the body is placed into the penis to allow blood to flow around (bypass) damaged or blocked blood vessels.  Lifestyle changes, such as exercising more, losing weight, and quitting smoking. Follow these instructions at home: Medicines  Take over-the-counter and prescription medicines only as told by your health care provider. Do not increase the dosage without first discussing it with your health care provider.  If you are using self-injections, perform injections as directed by your health care provider. Make sure to avoid any veins that are on the surface of the penis. After giving an injection, apply pressure to the injection site for 5 minutes.   General instructions  Exercise regularly, as directed by your health care provider. Work with your health care provider to lose weight, if needed.  Do not use any products that contain nicotine or tobacco, such as cigarettes and e-cigarettes. If you need help quitting, ask your health care provider.  Before using a vacuum pump, read the instructions that come with the pump and discuss any questions with your health care provider.  Keep all follow-up visits as told by your health care provider. This is  important. Contact a health care  provider if:  You feel nauseous.  You vomit. Get help right away if:  You are taking oral or injectable medicines and you have an erection that lasts longer than 4 hours. If your health care provider is unavailable, go to the nearest emergency room for evaluation. An erection that lasts much longer than 4 hours can result in permanent damage to your penis.  You have severe pain in your groin or abdomen.  You develop redness or severe swelling of your penis.  You have redness spreading up into your groin or lower abdomen.  You are unable to urinate.  You experience chest pain or a rapid heart beat (palpitations) after taking oral medicines. Summary  Erectile dysfunction (ED) is the inability to get or keep an erection during sexual intercourse. This problem can usually be treated successfully.  This condition is diagnosed based on a physical exam, your symptoms, and tests to determine the cause. Treatment varies depending on the cause and may include medicines, hormone therapy, surgery, or a vacuum pump.  You may need follow-up visits to make sure that you are using your medicines or devices correctly.  Get help right away if you are taking or injecting medicines and you have an erection that lasts longer than 4 hours. This information is not intended to replace advice given to you by your health care provider. Make sure you discuss any questions you have with your health care provider. Document Revised: 02/25/2020 Document Reviewed: 02/25/2020 Elsevier Patient Education  2021 [place].   Fatigue If you have fatigue, you feel tired all the time and have a lack of energy or a lack of motivation. Fatigue may make it difficult to start or complete tasks because of exhaustion. In general, occasional or mild fatigue is often a normal response to activity or life. However, long-lasting (chronic) or extreme fatigue may be a symptom of a medical condition. Follow these instructions at  home: General instructions  Watch your fatigue for any changes.  Go to bed and get up at the same time every day.  Avoid fatigue by pacing yourself during the day and getting enough sleep at night.  Maintain a healthy weight. Medicines  Take over-the-counter and prescription medicines only as told by your health care provider.  Take a multivitamin, if told by your health care provider.  Do not use herbal or dietary supplements unless they are approved by your health care provider. Activity  Exercise regularly, as told by your health care provider.  Use or practice techniques to help you relax, such as yoga, tai chi, meditation, or massage therapy.   Eating and drinking  Avoid heavy meals in the evening.  Eat a well-balanced diet, which includes lean proteins, whole grains, plenty of fruits and vegetables, and low-fat dairy products.  Avoid consuming too much caffeine.  Avoid the use of alcohol.  Drink enough fluid to keep your urine pale yellow.   Lifestyle  Change situations that cause you stress. Try to keep your work and personal schedule in balance.  Do not use any products that contain nicotine or tobacco, such as cigarettes and e-cigarettes. If you need help quitting, ask your health care provider.  Do not use drugs. Contact a health care provider if:  Your fatigue does not get better.  You have a fever.  You suddenly lose or gain weight.  You have headaches.  You have trouble falling asleep or sleeping through the night.  You feel  angry, guilty, anxious, or sad.  You are unable to have a bowel movement (constipation).  Your skin is dry.  You have swelling in your legs or another part of your body. Get help right away if:  You feel confused.  Your vision is blurry.  You feel faint or you pass out.  You have a severe headache.  You have severe pain in your abdomen, your back, or the area between your waist and hips (pelvis).  You have chest  pain, shortness of breath, or an irregular or fast heartbeat.  You are unable to urinate, or you urinate less than normal.  You have abnormal bleeding, such as bleeding from the rectum, vagina, nose, lungs, or nipples.  You vomit blood.  You have thoughts about hurting yourself or others. If you ever feel like you may hurt yourself or others, or have thoughts about taking your own life, get help right away. You can go to your nearest emergency department or call:  Your local emergency services (911 in the [place]  A suicide crisis helpline, such as the [place] at 270 364 6672. This is open 24 hours a day. Summary  If you have fatigue, you feel tired all the time and have a lack of energy or a lack of motivation.  Fatigue may make it difficult to start or complete tasks because of exhaustion.  Long-lasting (chronic) or extreme fatigue may be a symptom of a medical condition.  Exercise regularly, as told by your health care provider.  Change situations that cause you stress. Try to keep your work and personal schedule in balance. This information is not intended to replace advice given to you by your health care provider. Make sure you discuss any questions you have with your health care provider. Document Revised: 12/30/2018 Document Reviewed: 03/05/2017 Elsevier Patient Education  2021 [employer].   If you have lab work done today you will be contacted with your lab results within the next 2 weeks.  If you have not heard from [place] then please contact us. The fastest way to get your results is to register for My Chart.   IF you received an x-ray today, you will receive an invoice from [facility]. Please contact [facility] at [phone] with questions or concerns regarding your invoice.   IF you received labwork today, you will receive an invoice from [place]. Please contact [facility] at [phone] with questions or concerns  regarding your invoice.   Our billing staff will not be able to assist you with questions regarding bills from these companies.  You will be contacted with the lab results as soon as they are available. The fastest way to get your results is to activate your My Chart account. Instructions are located on the last page of this paperwork. If you have not heard from [place] regarding the results in 2 weeks, please contact this office.          Signed, [name], MD [facility]

## 2020-07-12 NOTE — Patient Instructions (Addendum)
Make sure you are obtaining at least 64 ounces of water per day.  If any continued lightheadedness with standing, follow-up so we can discuss if medication changes are needed.  I am checking some lab work for the fatigue, but I would also like to see that improves with increased fluid intake.  Make sure you are eating regular meals, snacks in between as needed.  I will check hemoglobin A1c again today, but no medication changes.  Likely will need to take metformin 2 pills twice per day every day.  We will look at lab work first.  Viagra lowest effective dose for erectile dysfunction.  If any change in vision, hearing or new side effects let me know right away.  Follow-up in 3 months, sooner if fatigue is not improving or other new symptoms.    Erectile Dysfunction Erectile dysfunction (ED) is the inability to get or keep an erection in order to have sexual intercourse. ED is considered a symptom of an underlying disorder and not considered a disease. Erectile dysfunction may include:  Inability to get an erection.  Lack of enough hardness of the erection to allow penetration.  Loss of the erection before sex is finished. What are the causes? This condition may be caused by:  Certain medicines, such as: ? Pain relievers. ? Antihistamines. ? Antidepressants. ? Blood pressure medicines. ? Water pills (diuretics). ? Ulcer medicines. ? Muscle relaxants. ? Drugs.  Excessive drinking.  Psychological causes, such as: ? Anxiety. ? Depression. ? Sadness. ? Exhaustion. ? Performance fear. ? Stress.  Physical causes, such as: ? Artery problems. This may include diabetes, smoking, liver disease, or atherosclerosis. ? High blood pressure. ? Hormonal problems, such as low testosterone. ? Obesity. ? Nerve problems. This may include back or pelvic injuries, diabetes mellitus, multiple sclerosis, or Parkinson's disease. What are the signs or symptoms? Symptoms of this condition  include:  Inability to get an erection.  Lack of enough hardness of the erection to allow penetration.  Loss of the erection before sex is finished.  Normal erections at some times, but with frequent unsatisfactory episodes.  Low sexual satisfaction in either partner due to erection problems.  A curved penis occurring with erection. The curve may cause pain or the penis may be too curved to allow for intercourse.  Never having nighttime erections. How is this diagnosed? This condition is often diagnosed by:  Performing a physical exam to find other diseases or specific problems with the penis.  Asking you detailed questions about the problem.  Performing blood tests to check for diabetes mellitus or to measure hormone levels.  Performing other tests to check for underlying health conditions.  Performing an ultrasound exam to check for scarring.  Performing a test to check blood flow to the penis.  Doing a sleep study at home to measure nighttime erections. How is this treated? This condition may be treated by:  Medicine taken by mouth to help you achieve an erection (oral medicine).  Hormone replacement therapy to replace low testosterone levels.  Medicine that is injected into the penis. Your health care provider may instruct you how to give yourself these injections at home.  Vacuum pump. This is a pump with a ring on it. The pump and ring are placed on the penis and used to create pressure that helps the penis become erect.  Penile implant surgery. In this procedure, you may receive: ? An inflatable implant. This consists of cylinders, a pump, and a reservoir. The cylinders  can be inflated with a fluid that helps to create an erection, and they can be deflated after intercourse. ? A semi-rigid implant. This consists of two silicone rubber rods. The rods provide some rigidity. They are also flexible, so the penis can both curve downward in its normal position and become  straight for sexual intercourse.  Blood vessel surgery, to improve blood flow to the penis. During this procedure, a blood vessel from a different part of the body is placed into the penis to allow blood to flow around (bypass) damaged or blocked blood vessels.  Lifestyle changes, such as exercising more, losing weight, and quitting smoking. Follow these instructions at home: Medicines  Take over-the-counter and prescription medicines only as told by your health care provider. Do not increase the dosage without first discussing it with your health care provider.  If you are using self-injections, perform injections as directed by your health care provider. Make sure to avoid any veins that are on the surface of the penis. After giving an injection, apply pressure to the injection site for 5 minutes.   General instructions  Exercise regularly, as directed by your health care provider. Work with your health care provider to lose weight, if needed.  Do not use any products that contain nicotine or tobacco, such as cigarettes and e-cigarettes. If you need help quitting, ask your health care provider.  Before using a vacuum pump, read the instructions that come with the pump and discuss any questions with your health care provider.  Keep all follow-up visits as told by your health care provider. This is important. Contact a health care provider if:  You feel nauseous.  You vomit. Get help right away if:  You are taking oral or injectable medicines and you have an erection that lasts longer than 4 hours. If your health care provider is unavailable, go to the nearest emergency room for evaluation. An erection that lasts much longer than 4 hours can result in permanent damage to your penis.  You have severe pain in your groin or abdomen.  You develop redness or severe swelling of your penis.  You have redness spreading up into your groin or lower abdomen.  You are unable to urinate.  You  experience chest pain or a rapid heart beat (palpitations) after taking oral medicines. Summary  Erectile dysfunction (ED) is the inability to get or keep an erection during sexual intercourse. This problem can usually be treated successfully.  This condition is diagnosed based on a physical exam, your symptoms, and tests to determine the cause. Treatment varies depending on the cause and may include medicines, hormone therapy, surgery, or a vacuum pump.  You may need follow-up visits to make sure that you are using your medicines or devices correctly.  Get help right away if you are taking or injecting medicines and you have an erection that lasts longer than 4 hours. This information is not intended to replace advice given to you by your health care provider. Make sure you discuss any questions you have with your health care provider. Document Revised: 02/25/2020 Document Reviewed: 02/25/2020 Elsevier Patient Education  2021 [employer].   Fatigue If you have fatigue, you feel tired all the time and have a lack of energy or a lack of motivation. Fatigue may make it difficult to start or complete tasks because of exhaustion. In general, occasional or mild fatigue is often a normal response to activity or life. However, long-lasting (chronic) or extreme fatigue may be  a symptom of a medical condition. Follow these instructions at home: General instructions  Watch your fatigue for any changes.  Go to bed and get up at the same time every day.  Avoid fatigue by pacing yourself during the day and getting enough sleep at night.  Maintain a healthy weight. Medicines  Take over-the-counter and prescription medicines only as told by your health care provider.  Take a multivitamin, if told by your health care provider.  Do not use herbal or dietary supplements unless they are approved by your health care provider. Activity  Exercise regularly, as told by your health care  provider.  Use or practice techniques to help you relax, such as yoga, tai chi, meditation, or massage therapy.   Eating and drinking  Avoid heavy meals in the evening.  Eat a well-balanced diet, which includes lean proteins, whole grains, plenty of fruits and vegetables, and low-fat dairy products.  Avoid consuming too much caffeine.  Avoid the use of alcohol.  Drink enough fluid to keep your urine pale yellow.   Lifestyle  Change situations that cause you stress. Try to keep your work and personal schedule in balance.  Do not use any products that contain nicotine or tobacco, such as cigarettes and e-cigarettes. If you need help quitting, ask your health care provider.  Do not use drugs. Contact a health care provider if:  Your fatigue does not get better.  You have a fever.  You suddenly lose or gain weight.  You have headaches.  You have trouble falling asleep or sleeping through the night.  You feel angry, guilty, anxious, or sad.  You are unable to have a bowel movement (constipation).  Your skin is dry.  You have swelling in your legs or another part of your body. Get help right away if:  You feel confused.  Your vision is blurry.  You feel faint or you pass out.  You have a severe headache.  You have severe pain in your abdomen, your back, or the area between your waist and hips (pelvis).  You have chest pain, shortness of breath, or an irregular or fast heartbeat.  You are unable to urinate, or you urinate less than normal.  You have abnormal bleeding, such as bleeding from the rectum, vagina, nose, lungs, or nipples.  You vomit blood.  You have thoughts about hurting yourself or others. If you ever feel like you may hurt yourself or others, or have thoughts about taking your own life, get help right away. You can go to your nearest emergency department or call:  Your local emergency services (911 in the [place]  A suicide crisis helpline, such  as the [facility] at 9390058146. This is open 24 hours a day. Summary  If you have fatigue, you feel tired all the time and have a lack of energy or a lack of motivation.  Fatigue may make it difficult to start or complete tasks because of exhaustion.  Long-lasting (chronic) or extreme fatigue may be a symptom of a medical condition.  Exercise regularly, as told by your health care provider.  Change situations that cause you stress. Try to keep your work and personal schedule in balance. This information is not intended to replace advice given to you by your health care provider. Make sure you discuss any questions you have with your health care provider. Document Revised: 12/30/2018 Document Reviewed: 03/05/2017 Elsevier Patient Education  2021 [employer].   If you have lab work  done today you will be contacted with your lab results within the next 2 weeks.  If you have not heard from [place] then please contact us. The fastest way to get your results is to register for My Chart.   IF you received an x-ray today, you will receive an invoice from [facility]. Please contact [facility] at [phone] with questions or concerns regarding your invoice.   IF you received labwork today, you will receive an invoice from [facility]. Please contact [facility] at [phone] with questions or concerns regarding your invoice.   Our billing staff will not be able to assist you with questions regarding bills from these companies.  You will be contacted with the lab results as soon as they are available. The fastest way to get your results is to activate your My Chart account. Instructions are located on the last page of this paperwork. If you have not heard from [place] regarding the results in 2 weeks, please contact this office.

## 2020-07-13 LAB — HEMOGLOBIN A1C
Est. average glucose Bld gHb Est-mCnc: 183 mg/dL
Hgb A1c MFr Bld: 8 % — ABNORMAL HIGH (ref 4.8–5.6)

## 2020-07-13 LAB — BASIC METABOLIC PANEL
BUN/Creatinine Ratio: 12 (ref 10–24)
BUN: 9 mg/dL (ref 8–27)
CO2: 23 mmol/L (ref 20–29)
Calcium: 9.4 mg/dL (ref 8.6–10.2)
Chloride: 96 mmol/L (ref 96–106)
Creatinine, Ser: 0.73 mg/dL — ABNORMAL LOW (ref 0.76–1.27)
GFR calc Af Amer: 114 mL/min/{1.73_m2} (ref >59)
GFR calc non Af Amer: 99 mL/min/{1.73_m2} (ref >59)
Glucose: 149 mg/dL — ABNORMAL HIGH (ref 65–99)
Potassium: 4.1 mmol/L (ref 3.5–5.2)
Sodium: 135 mmol/L (ref 134–144)

## 2020-07-13 LAB — CBC
Hematocrit: 34.8 % — ABNORMAL LOW (ref 37.5–51.0)
Hemoglobin: 11.1 g/dL — ABNORMAL LOW (ref 13.0–17.7)
MCH: 25.5 pg — ABNORMAL LOW (ref 26.6–33.0)
MCHC: 31.9 g/dL (ref 31.5–35.7)
MCV: 80 fL (ref 79–97)
Platelets: 303 10*3/uL (ref 150–450)
RBC: 4.36 x10E6/uL (ref 4.14–5.80)
RDW: 15.6 % — ABNORMAL HIGH (ref 11.6–15.4)
WBC: 4.6 10*3/uL (ref 3.4–10.8)

## 2020-07-13 LAB — TESTOSTERONE: Testosterone: 282 ng/dL (ref 264–916)

## 2020-09-07 ENCOUNTER — Other Ambulatory Visit: Payer: Self-pay | Admitting: Family Medicine

## 2020-09-07 DIAGNOSIS — I10 Essential (primary) hypertension: Secondary | ICD-10-CM

## 2020-09-07 DIAGNOSIS — E785 Hyperlipidemia, unspecified: Secondary | ICD-10-CM

## 2020-09-07 DIAGNOSIS — K219 Gastro-esophageal reflux disease without esophagitis: Secondary | ICD-10-CM

## 2020-09-07 NOTE — Telephone Encounter (Signed)
Requested Prescriptions  Pending Prescriptions Disp Refills  . lisinopril-hydrochlorothiazide (ZESTORETIC) 20-12.5 MG tablet [Pharmacy Med Name: Lisinopril-hydroCHLOROthiazide 20-12.5 MG Oral Tablet] 180 tablet 1    Sig: Take 2 tablets by mouth once daily     Cardiovascular:  ACEI + Diuretic Combos Failed - 09/07/2020  5:30 AM      Failed - Cr in normal range and within 180 days    Creat  Date Value Ref Range Status  02/08/2016 0.69 (L) 0.70 - 1.33 mg/dL Final    Comment:      For patients > or = 64 years of age: The upper reference limit for Creatinine is approximately 13% higher for people identified as African-American.      Creatinine, Ser  Date Value Ref Range Status  07/12/2020 0.73 (L) 0.76 - 1.27 mg/dL Final         Passed - Na in normal range and within 180 days    Sodium  Date Value Ref Range Status  07/12/2020 135 134 - 144 mmol/L Final         Passed - K in normal range and within 180 days    Potassium  Date Value Ref Range Status  07/12/2020 4.1 3.5 - 5.2 mmol/L Final         Passed - Ca in normal range and within 180 days    Calcium  Date Value Ref Range Status  07/12/2020 9.4 8.6 - 10.2 mg/dL Final   Calcium, Ion  Date Value Ref Range Status  02/17/2012 1.19 1.12 - 1.23 mmol/L Final         Passed - Patient is not pregnant      Passed - Last BP in normal range    BP Readings from Last 1 Encounters:  07/12/20 122/77         Passed - Valid encounter within last 6 months    Recent Outpatient Visits          1 month ago Type 2 diabetes mellitus with hypoglycemia without coma, without long-term current use of insulin ([place])   [facility], Ranell Patrick, MD   4 months ago Essential hypertension   [facility], Ranell Patrick, MD   4 months ago Type 2 diabetes mellitus with hypoglycemia without coma, without long-term current use of insulin [facility])   [facility], Ranell Patrick, MD   1 year ago Hyperlipidemia,  unspecified hyperlipidemia type   [facility], Ranell Patrick, MD   1 year ago Atypical chest pain   [facility], Fenton Malling, MD             . pantoprazole (PROTONIX) 40 MG tablet [Pharmacy Med Name: Pantoprazole Sodium 40 MG Oral Tablet Delayed Release] 90 tablet 1    Sig: Take 1 tablet by mouth once daily     Gastroenterology: Proton Pump Inhibitors Passed - 09/07/2020  5:30 AM      Passed - Valid encounter within last 12 months    Recent Outpatient Visits          1 month ago Type 2 diabetes mellitus with hypoglycemia without coma, without long-term current use of insulin [facility])   [facility], Ranell Patrick, MD   4 months ago Essential hypertension   [facility], Ranell Patrick, MD   4 months ago Type 2 diabetes mellitus with hypoglycemia without coma, without long-term current use of insulin ([place])  [facility], Ranell Patrick, MD   1 year ago Hyperlipidemia, unspecified hyperlipidemia type   [facility], Ranell Patrick, MD   1 year ago Atypical chest pain   [facility], Fenton Malling, MD             . atorvastatin (LIPITOR) 10 MG tablet [Pharmacy Med Name: Atorvastatin Calcium 10 MG Oral Tablet] 90 tablet 1    Sig: Take 1 tablet by mouth once daily     Cardiovascular:  Antilipid - Statins Failed - 09/07/2020  5:30 AM      Failed - LDL in normal range and within 360 days    LDL Chol Calc (NIH)  Date Value Ref Range Status  04/12/2020 78 0 - 99 mg/dL Final         Passed - Total Cholesterol in normal range and within 360 days    Cholesterol, Total  Date Value Ref Range Status  04/12/2020 142 100 - 199 mg/dL Final         Passed - HDL in normal range and within 360 days    HDL  Date Value Ref Range Status  04/12/2020 46 >39 mg/dL Final         Passed - Triglycerides in normal range and within 360 days    Triglycerides  Date Value Ref Range Status  04/12/2020 94 0 -  149 mg/dL Final         Passed - Patient is not pregnant      Passed - Valid encounter within last 12 months    Recent Outpatient Visits          1 month ago Type 2 diabetes mellitus with hypoglycemia without coma, without long-term current use of insulin ([place])   [facility], Ranell Patrick, MD   4 months ago Essential hypertension   [facility], Ranell Patrick, MD   4 months ago Type 2 diabetes mellitus with hypoglycemia without coma, without long-term current use of insulin [facility])   [facility], Ranell Patrick, MD   1 year ago Hyperlipidemia, unspecified hyperlipidemia type   [facility], Ranell Patrick, MD   1 year ago Atypical chest pain   [facility] at [facility], Fenton Malling, MD

## 2020-10-08 ENCOUNTER — Other Ambulatory Visit: Payer: Self-pay | Admitting: Family Medicine

## 2020-10-08 DIAGNOSIS — I10 Essential (primary) hypertension: Secondary | ICD-10-CM

## 2020-10-11 ENCOUNTER — Ambulatory Visit: Payer: Self-pay | Admitting: Family Medicine

## 2021-03-12 ENCOUNTER — Ambulatory Visit: Payer: 59 | Admitting: Family Medicine

## 2021-03-12 ENCOUNTER — Other Ambulatory Visit: Payer: Self-pay

## 2021-03-12 ENCOUNTER — Encounter: Payer: Self-pay | Admitting: Family Medicine

## 2021-03-12 VITALS — BP 136/64 | HR 59 | Temp 98.3°F | Resp 15 | Ht 73.0 in | Wt 203.6 lb

## 2021-03-12 DIAGNOSIS — J029 Acute pharyngitis, unspecified: Secondary | ICD-10-CM

## 2021-03-12 DIAGNOSIS — E11649 Type 2 diabetes mellitus with hypoglycemia without coma: Secondary | ICD-10-CM | POA: Diagnosis not present

## 2021-03-12 DIAGNOSIS — K219 Gastro-esophageal reflux disease without esophagitis: Secondary | ICD-10-CM | POA: Diagnosis not present

## 2021-03-12 DIAGNOSIS — I10 Essential (primary) hypertension: Secondary | ICD-10-CM

## 2021-03-12 DIAGNOSIS — Z23 Encounter for immunization: Secondary | ICD-10-CM | POA: Diagnosis not present

## 2021-03-12 DIAGNOSIS — R591 Generalized enlarged lymph nodes: Secondary | ICD-10-CM

## 2021-03-12 DIAGNOSIS — E785 Hyperlipidemia, unspecified: Secondary | ICD-10-CM | POA: Diagnosis not present

## 2021-03-12 LAB — HEMOGLOBIN A1C: Hgb A1c MFr Bld: 8.3 % — ABNORMAL HIGH (ref 4.6–6.5)

## 2021-03-12 MED ORDER — METFORMIN HCL 500 MG PO TABS: 1000.0000 mg | ORAL_TABLET | Freq: Two times a day (BID) | ORAL | 1 refills | Status: DC

## 2021-03-12 MED ORDER — AMOXICILLIN 875 MG PO TABS: 875.0000 mg | ORAL_TABLET | Freq: Two times a day (BID) | ORAL | 0 refills | Status: AC

## 2021-03-12 MED ORDER — LISINOPRIL-HYDROCHLOROTHIAZIDE 20-12.5 MG PO TABS: 2.0000 | ORAL_TABLET | Freq: Every day | ORAL | 1 refills | Status: DC

## 2021-03-12 MED ORDER — PANTOPRAZOLE SODIUM 40 MG PO TBEC: 40.0000 mg | DELAYED_RELEASE_TABLET | Freq: Every day | ORAL | 1 refills | Status: DC

## 2021-03-12 MED ORDER — ATORVASTATIN CALCIUM 10 MG PO TABS: 10.0000 mg | ORAL_TABLET | Freq: Every day | ORAL | 1 refills | Status: DC

## 2021-03-12 NOTE — Progress Notes (Signed)
Subjective:  Patient ID: Brendan Gonzales, male    DOB: [birth year]  Age: 65 y.o. MRN: [id number]  CC:  Chief Complaint  Patient presents with   Sore Throat    Pt reports pain in his throat and rash around the area as well, coughs when around smells pt reports seeing dr in [place] in May was given an abx but not better    Hyperlipidemia    Due for recheck and refill no concerns or comments by pt    Hypoglycemia    Pt was having hypoglycemic episodes at last visit pt declines any recently and feels well    Gastroesophageal Reflux    Stable pt in need of refill today /soon     HPI Brendan Gonzales presents for   Sore throat Initially noticed in May - sore, trouble swallowing, cough with certain smells. Treated in [place] and treated with antibiotics, mouthwash - improved for a few months. Restarted 2 weeks ago - sore throat, painful to swallow. Drinking and eating, just sore. No fever. Some headache, some occasional cough. No fever. Has not checked self for covid 19.  Feels about the same. No sick contacts.  Tx: tylenol - some relief   Diabetes: Complicated by hyperglycemia and hypoglycemia, last visit in January.  30-monthfollow-up recommended at that time.  [place]  Had experienced some hypoglycemia events but had not been recent when discussed in January.  Was continued on metformin 1000 mg twice daily.no new side effects with meds.  He is on statin, ACE inhibitor No recent lows, 133 yesterday. Fasting today.  Wt Readings from Last 3 Encounters:  03/12/21 203 lb 9.6 oz (92.4 kg)  07/12/20 201 lb (91.2 kg)  05/08/20 199 lb (90.3 kg)   Microalbumin: Normal ratio 08/18/2019 Optho, foot exam, pneumovax: Up-to-date  Lab Results  Component Value Date   HGBA1C 8.0 (H) 07/12/2020   HGBA1C 8.5 (H) 04/12/2020   HGBA1C 8.2 (H) 08/18/2019   Lab Results  Component Value Date   MICROALBUR 3.9 10/02/2015   LDLCALC 78 04/12/2020    CREATININE 0.73 (L) 07/12/2020   Hypertension: Lisinopril HCTZ 20/12.5 mg 2 tablets/day for total dose of 40/25 mg. Home readings: 126/77 BP Readings from Last 3 Encounters:  03/12/21 136/64  07/12/20 122/77  05/08/20 130/88   Lab Results  Component Value Date   CREATININE 0.73 (L) 07/12/2020   Hyperlipidemia: Lipitor 10 mg qd. No new myalgias.  Lab Results  Component Value Date   CHOL 142 04/12/2020   HDL 46 04/12/2020   LDLCALC 78 04/12/2020   TRIG 94 04/12/2020   CHOLHDL 3.1 04/12/2020   Lab Results  Component Value Date   ALT 12 04/12/2020   AST 16 04/12/2020   ALKPHOS 102 04/12/2020   BILITOT 0.5 04/12/2020   GERD Protonix 40 mg daily. Working well.      History Patient Active Problem List   Diagnosis Date Noted   Hospice care patient 07/06/2019   Eczema of hand 10/19/2014   [place]   HTN (hypertension) 06/13/2013   Normal coronary arteries- 2008 06/13/2013   Chest pain with moderate risk of acute coronary syndrome 06/12/2013   Special screening for malignant neoplasms, colon 06/27/2011   Arthritis of knee 06/14/2011   Past Medical History:  Diagnosis Date   Arthritis    Constipation    Diabetes mellitus    type 2  Dizziness    Headache(784.0)    Hyperlipidemia    Hypertension    benign   Past Surgical History:  Procedure Laterality Date   COLONOSCOPY     CORONARY ANGIOPLASTY     HEMORRHOID SURGERY     right femeroral bypass     right knee surgery     Allergies  Allergen Reactions   Metoprolol Diarrhea   Prior to Admission medications   Medication Sig Start Date End Date Taking? Authorizing Provider  acetaminophen (TYLENOL) 500 MG tablet Take 500 mg by mouth 3 (three) times daily as needed for mild pain.    Yes [provider]  aspirin 81 MG tablet Take 81 mg by mouth every morning.   Yes [provider]  atorvastatin (LIPITOR) 10 MG tablet Take 1 tablet by mouth once daily  09/07/20  Yes [name], MD  lisinopril-hydrochlorothiazide (ZESTORETIC) 20-12.5 MG tablet Take 2 tablets by mouth once daily 09/07/20  Yes [name], MD  metFORMIN (GLUCOPHAGE) 500 MG tablet Take 2 tablets by mouth twice daily 06/27/20  Yes [name], MD  pantoprazole (PROTONIX) 40 MG tablet Take 1 tablet by mouth once daily 09/07/20  Yes [name], MD   Social History   Socioeconomic History   Marital status: Married    Spouse name: Not on file   Number of children: Not on file   Years of education: Not on file   Highest education level: Not on file  Occupational History   Not on file  Tobacco Use   Smoking status: Former   Smokeless tobacco: Never  Substance and Sexual Activity   Alcohol use: No   Drug use: No   Sexual activity: Never  Other Topics Concern   Not on file  Social History Narrative   Not on file   Social Determinants of Health   Financial Resource Strain: Not on file  Food Insecurity: Not on file  Transportation Needs: Not on file  Physical Activity: Not on file  Stress: Not on file  Social Connections: Not on file  Intimate Partner Violence: Not on file    Review of Systems  Constitutional:  Negative for fatigue and unexpected weight change.  Eyes:  Negative for visual disturbance.  Respiratory:  Negative for cough, chest tightness and shortness of breath.   Cardiovascular:  Negative for chest pain, palpitations and leg swelling.  Gastrointestinal:  Negative for abdominal pain and blood in stool.  Neurological:  Positive for headaches (mild with sore throat). Negative for dizziness and light-headedness.    Objective:   Vitals:   03/12/21 1018  BP: 136/64  Pulse: (!) 59  Resp: 15  Temp: 98.3 F (36.8 C)  TempSrc: Temporal  SpO2: 98%  Weight: 203 lb 9.6 oz (92.4 kg)  Height: '6\' 1"'$  (1.854 m)     Physical Exam Vitals reviewed.  Constitutional:      Appearance: He is well-developed.  HENT:     Head:  Normocephalic and atraumatic.     Mouth/Throat:     Tonsils: No tonsillar exudate or tonsillar abscesses.  Neck:     Vascular: No carotid bruit or JVD.  Cardiovascular:     Rate and Rhythm: Normal rate and regular rhythm.     Heart sounds: Normal heart sounds. No murmur heard. Pulmonary:     Effort: Pulmonary effort is normal.     Breath sounds: Normal breath sounds. No rales.  Musculoskeletal:     Right lower leg: No edema.  Left lower leg: No edema.  Lymphadenopathy:     Cervical: Cervical adenopathy (Anterior cervical nodes 2 submandibular nodes slight discomfort, possible slight enlargement bilateral.) present.  Skin:    General: Skin is warm and dry.  Neurological:     Mental Status: He is alert and oriented to person, place, and time.  Psychiatric:        Mood and Affect: Mood normal.       Assessment & Plan:  Brendan Gonzales is a 65 y.o. male . Type 2 diabetes mellitus with hypoglycemia without coma, without long-term current use of insulin (HCC) - Plan: metFORMIN (GLUCOPHAGE) 500 MG tablet, Comprehensive metabolic panel, Hemoglobin A1c, Microalbumin / creatinine urine ratio  - check labs, no changes for now.   Hyperlipidemia, unspecified hyperlipidemia type - Plan: atorvastatin (LIPITOR) 10 MG tablet, Comprehensive metabolic panel, Lipid panel  -Continue Lipitor, check labs.  Essential hypertension - Plan: lisinopril-hydrochlorothiazide (ZESTORETIC) 20-12.5 MG tablet, Comprehensive metabolic panel  -Check labs, continue same dose lisinopril HCTZ.  Gastroesophageal reflux disease, unspecified whether esophagitis present - Plan: pantoprazole (PROTONIX) 40 MG tablet  -Stable, continue Protonix, refilled  Flu vaccine need - Plan: Flu Vaccine QUAD 74moIM (Fluarix, Fluzone & Alfiuria Quad PF)  Sore throat - Plan: amoxicillin (AMOXIL) 875 MG tablet Lymphadenopathy - Plan: amoxicillin (AMOXIL) 875 MG tablet  - viral illness possible, but with LAD, recurrent sx's will  treat with amoxicillin, recheck if not resolving next few weeks.   Meds ordered this encounter  Medications   atorvastatin (LIPITOR) 10 MG tablet    Sig: Take 1 tablet (10 mg total) by mouth daily.    Dispense:  90 tablet    Refill:  1   metFORMIN (GLUCOPHAGE) 500 MG tablet    Sig: Take 2 tablets (1,000 mg total) by mouth 2 (two) times daily.    Dispense:  360 tablet    Refill:  1   lisinopril-hydrochlorothiazide (ZESTORETIC) 20-12.5 MG tablet    Sig: Take 2 tablets by mouth daily.    Dispense:  180 tablet    Refill:  1   pantoprazole (PROTONIX) 40 MG tablet    Sig: Take 1 tablet (40 mg total) by mouth daily.    Dispense:  90 tablet    Refill:  1   amoxicillin (AMOXIL) 875 MG tablet    Sig: Take 1 tablet (875 mg total) by mouth 2 (two) times daily for 10 days.    Dispense:  20 tablet    Refill:  0   Patient Instructions  Sore throat, cough, headache could have been due to a virus, even possibly COVID-19 infection but as you still have symptoms 2 weeks later and swollen lymph nodes, can try antibiotic amoxicillin twice per day for 10 days.  Recheck with me in 3 weeks, sooner if any worsening symptoms.  No med changes today, I will check some lab work and can discuss further at follow-up visit.  Thanks for coming in today.  Sore Throat A sore throat is pain, burning, irritation, or scratchiness in the throat. When you have a sore throat, you may feel pain or tenderness in your throat when you swallow or talk. Many things can cause a sore throat, including: An infection. Seasonal allergies. Dryness in the air. Irritants, such as smoke or pollution. Radiation treatment for cancer. Gastroesophageal reflux disease (GERD). A tumor. A sore throat is often the first sign of another sickness. It may happen with other symptoms, such as coughing, sneezing, fever, and swollen neck glands. Most  sore throats go away without medical treatment. Follow these instructions at home:    Medicines Take over-the-counter and prescription medicines only as told by your health care provider. Children often get sore throats. Do not give your child aspirin because of the association with Reye's syndrome. Use throat sprays to soothe your throat as told by your health care provider. Managing pain To help with pain, try: Sipping warm liquids, such as broth, herbal tea, or warm water. Eating or drinking cold or frozen liquids, such as frozen ice pops. Gargling with a mixture of salt and water 3-4 times a day or as needed. To make salt water, completely dissolve -1 tsp (3-6 g) of salt in 1 cup (237 mL) of warm water. Sucking on hard candy or throat lozenges. Putting a cool-mist humidifier in your bedroom at night to moisten the air. Sitting in the bathroom with the door closed for 5-10 minutes while you run hot water in the shower. General instructions Do not use any products that contain nicotine or tobacco. These products include cigarettes, chewing tobacco, and vaping devices, such as e-cigarettes. If you need help quitting, ask your health care provider. Rest as needed. Drink enough fluid to keep your urine pale yellow. Wash your hands often with soap and water for at least 20 seconds. If soap and water are not available, use hand sanitizer. Contact a health care provider if: You have a fever for more than 2-3 days. You have symptoms that last for more than 2-3 days. Your throat does not get better within 7 days. You have a fever and your symptoms suddenly get worse. Get help right away if: You have difficulty breathing. You cannot swallow fluids, soft foods, or your saliva. You have increased swelling in your throat or neck. You have persistent nausea and vomiting. These symptoms may represent a serious problem that is an emergency. Do not wait to see if the symptoms will go away. Get medical help right away. Call your local emergency services (911 in the [place] Do not drive  yourself to the hospital. Summary A sore throat is pain, burning, irritation, or scratchiness in the throat. Many things can cause a sore throat. Take over-the-counter medicines only as told by your health care provider. Rest as needed. Drink enough fluid to keep your urine pale yellow. Contact a health care provider if your throat does not get better within 7 days. This information is not intended to replace advice given to you by your health care provider. Make sure you discuss any questions you have with your health care provider. Document Revised: 09/06/2020 Document Reviewed: 09/06/2020 Elsevier Patient Education  2022 [place],   [name], MD [place] 03/12/21 11:11 AM

## 2021-03-12 NOTE — Patient Instructions (Signed)
Sore throat, cough, headache could have been due to a virus, even possibly COVID-19 infection but as you still have symptoms 2 weeks later and swollen lymph nodes, can try antibiotic amoxicillin twice per day for 10 days.  Recheck with me in 3 weeks, sooner if any worsening symptoms.  No med changes today, I will check some lab work and can discuss further at follow-up visit.  Thanks for coming in today.  Sore Throat A sore throat is pain, burning, irritation, or scratchiness in the throat. When you have a sore throat, you may feel pain or tenderness in your throat when you swallow or talk. Many things can cause a sore throat, including: An infection. Seasonal allergies. Dryness in the air. Irritants, such as smoke or pollution. Radiation treatment for cancer. Gastroesophageal reflux disease (GERD). A tumor. A sore throat is often the first sign of another sickness. It may happen with other symptoms, such as coughing, sneezing, fever, and swollen neck glands. Most sore throats go away without medical treatment. Follow these instructions at home:   Medicines Take over-the-counter and prescription medicines only as told by your health care provider. Children often get sore throats. Do not give your child aspirin because of the association with Reye's syndrome. Use throat sprays to soothe your throat as told by your health care provider. Managing pain To help with pain, try: Sipping warm liquids, such as broth, herbal tea, or warm water. Eating or drinking cold or frozen liquids, such as frozen ice pops. Gargling with a mixture of salt and water 3-4 times a day or as needed. To make salt water, completely dissolve -1 tsp (3-6 g) of salt in 1 cup (237 mL) of warm water. Sucking on hard candy or throat lozenges. Putting a cool-mist humidifier in your bedroom at night to moisten the air. Sitting in the bathroom with the door closed for 5-10 minutes while you run hot water in the shower. General  instructions Do not use any products that contain nicotine or tobacco. These products include cigarettes, chewing tobacco, and vaping devices, such as e-cigarettes. If you need help quitting, ask your health care provider. Rest as needed. Drink enough fluid to keep your urine pale yellow. Wash your hands often with soap and water for at least 20 seconds. If soap and water are not available, use hand sanitizer. Contact a health care provider if: You have a fever for more than 2-3 days. You have symptoms that last for more than 2-3 days. Your throat does not get better within 7 days. You have a fever and your symptoms suddenly get worse. Get help right away if: You have difficulty breathing. You cannot swallow fluids, soft foods, or your saliva. You have increased swelling in your throat or neck. You have persistent nausea and vomiting. These symptoms may represent a serious problem that is an emergency. Do not wait to see if the symptoms will go away. Get medical help right away. Call your local emergency services (911 in the [place] Do not drive yourself to the hospital. Summary A sore throat is pain, burning, irritation, or scratchiness in the throat. Many things can cause a sore throat. Take over-the-counter medicines only as told by your health care provider. Rest as needed. Drink enough fluid to keep your urine pale yellow. Contact a health care provider if your throat does not get better within 7 days. This information is not intended to replace advice given to you by your health care provider. Make sure you discuss  any questions you have with your health care provider. Document Revised: 09/06/2020 Document Reviewed: 09/06/2020 Elsevier Patient Education  [place].

## 2021-03-13 LAB — LIPID PANEL
Cholesterol: 145 mg/dL (ref 0–200)
HDL: 46.7 mg/dL (ref >39.00)
LDL Cholesterol: 77 mg/dL (ref 0–99)
NonHDL: 98.24
Total CHOL/HDL Ratio: 3
Triglycerides: 104 mg/dL (ref 0.0–149.0)
VLDL: 20.8 mg/dL (ref 0.0–40.0)

## 2021-03-13 LAB — COMPREHENSIVE METABOLIC PANEL
ALT: 12 U/L (ref 0–53)
AST: 16 U/L (ref 0–37)
Albumin: 4.3 g/dL (ref 3.5–5.2)
Alkaline Phosphatase: 82 U/L (ref 39–117)
BUN: 10 mg/dL (ref 6–23)
CO2: 30 mEq/L (ref 19–32)
Calcium: 9.2 mg/dL (ref 8.4–10.5)
Chloride: 98 mEq/L (ref 96–112)
Creatinine, Ser: 0.71 mg/dL (ref 0.40–1.50)
GFR: 97.32 mL/min (ref >60.00)
Glucose, Bld: 126 mg/dL — ABNORMAL HIGH (ref 70–99)
Potassium: 4.1 mEq/L (ref 3.5–5.1)
Sodium: 136 mEq/L (ref 135–145)
Total Bilirubin: 0.4 mg/dL (ref 0.2–1.2)
Total Protein: 7.2 g/dL (ref 6.0–8.3)

## 2021-03-13 LAB — MICROALBUMIN / CREATININE URINE RATIO
Creatinine,U: 56.3 mg/dL
Microalb Creat Ratio: 2 mg/g (ref 0.0–30.0)
Microalb, Ur: 1.1 mg/dL (ref 0.0–1.9)

## 2021-04-02 ENCOUNTER — Encounter: Payer: Self-pay | Admitting: Family Medicine

## 2021-04-02 ENCOUNTER — Other Ambulatory Visit: Payer: Self-pay

## 2021-04-02 ENCOUNTER — Ambulatory Visit: Payer: 59 | Admitting: Family Medicine

## 2021-04-02 VITALS — BP 132/78 | HR 57 | Temp 98.2°F | Resp 16 | Ht 73.0 in | Wt 203.0 lb

## 2021-04-02 DIAGNOSIS — M25562 Pain in left knee: Secondary | ICD-10-CM

## 2021-04-02 DIAGNOSIS — M25561 Pain in right knee: Secondary | ICD-10-CM

## 2021-04-02 DIAGNOSIS — E1165 Type 2 diabetes mellitus with hyperglycemia: Secondary | ICD-10-CM

## 2021-04-02 DIAGNOSIS — R591 Generalized enlarged lymph nodes: Secondary | ICD-10-CM

## 2021-04-02 DIAGNOSIS — G8929 Other chronic pain: Secondary | ICD-10-CM

## 2021-04-02 MED ORDER — DAPAGLIFLOZIN PROPANEDIOL 5 MG PO TABS: 5.0000 mg | ORAL_TABLET | Freq: Every day | ORAL | 1 refills | Status: DC

## 2021-04-02 MED ORDER — DICLOFENAC SODIUM 1 % EX CREA: 4.0000 g | TOPICAL_CREAM | Freq: Four times a day (QID) | CUTANEOUS | 2 refills | Status: DC | PRN

## 2021-04-02 NOTE — Patient Instructions (Signed)
I am glad to hear the sore throat has improved.  There is a small lymph node below the left jaw.  If that is still present in the next 3 weeks, please return for recheck as may need imaging/biopsy or evaluation by specialist.  Continue metformin, but add new medication Farxiga once per day for diabetes.  Recheck in 2 months for repeat testing.  Okay to continue Tylenol, knee sleeve or brace for knee pain, and start diclofenac topical gel up to 4 times per day.  If that is not helping I would recommend follow-up with your orthopedist.  Let me know if there are questions and thank you for coming in today.

## 2021-04-02 NOTE — Progress Notes (Signed)
Subjective:  Patient ID: Brendan Gonzales, male    DOB: 05-Feb-1956  Age: 65 y.o. MRN: [id number]  CC:  Chief Complaint  Patient presents with   Sore Throat    Pt reports doing okay still tired, no questions at this time, pt reports sore throat has resolved     HPI Nathanial Arrighi presents for   Sore throat Follow-up from September 19 visit.  Initially noticed in May.  Treated in [place] with antibiotics, mouthwash, then improved for few months.  Had return 2 weeks prior to last visit.  Some headache and cough, COVID-19 testing was negative at home.  He continued on his PPI for GERD.  Did note cervical lymphadenopathy on exam, treated with amoxicillin. Sore throat has resolved.  Still some fatigue.  Diabetes: With hyperglycemia, persistent elevated A1c at last visit.  Currently on metformin 1000 mg twice daily.  He is on statin and ACE inhibitor.  Home readings 137 yesterday. Lowest reading. Highest 219.  Lab Results  Component Value Date   HGBA1C 8.3 (H) 03/12/2021   HGBA1C 8.0 (H) 07/12/2020   HGBA1C 8.5 (H) 04/12/2020   Lab Results  Component Value Date   MICROALBUR 1.1 03/12/2021   LDLCALC 77 03/12/2021   CREATININE 0.71 03/12/2021   Knee pain: Longstanding. Prior injections at ortho helped but caused hyperglycemia and cost prohibitive. Uses knee sleeve and tylenol with some relief.  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 Looking for other work but has been difficult. Degenerative changes noted bilaterally on xrays 03/2020.    History Patient Active Problem List   Diagnosis Date Noted   Hospice care patient 07/06/2019   Eczema of hand 10/19/2014   Type 2 diabetes mellitus ([place]) 06/13/2013   HTN (hypertension) 06/13/2013   Normal coronary arteries- 2008 06/13/2013   Chest pain with moderate risk of acute coronary syndrome 06/12/2013   Special screening for malignant neoplasms, colon 06/27/2011   Arthritis of knee 06/14/2011   Past Medical History:  Diagnosis Date    Arthritis    Constipation    Diabetes mellitus    type 2   Dizziness    Headache(784.0)    Hyperlipidemia    Hypertension    benign   Past Surgical History:  Procedure Laterality Date   COLONOSCOPY     CORONARY ANGIOPLASTY     HEMORRHOID SURGERY     right femeroral bypass     right knee surgery     Allergies  Allergen Reactions   Metoprolol Diarrhea   Prior to Admission medications   Medication Sig Start Date End Date Taking? Authorizing Provider  acetaminophen (TYLENOL) 500 MG tablet Take 500 mg by mouth 3 (three) times daily as needed for mild pain.    Yes [provider]  aspirin 81 MG tablet Take 81 mg by mouth every morning.   Yes [provider]  atorvastatin (LIPITOR) 10 MG tablet Take 1 tablet (10 mg total) by mouth daily. 03/12/21  Yes [name], MD  lisinopril-hydrochlorothiazide (ZESTORETIC) 20-12.5 MG tablet Take 2 tablets by mouth daily. 03/12/21  Yes [name], MD  metFORMIN (GLUCOPHAGE) 500 MG tablet Take 2 tablets (1,000 mg total) by mouth 2 (two) times daily. 03/12/21  Yes [name], MD  pantoprazole (PROTONIX) 40 MG tablet Take 1 tablet (40 mg total) by mouth daily. 03/12/21  Yes [name], MD   Social History   Socioeconomic History   Marital status: Married    Spouse name: Not on  file   Number of children: Not on file   Years of education: Not on file   Highest education level: Not on file  Occupational History   Not on file  Tobacco Use   Smoking status: Former   Smokeless tobacco: Never  Substance and Sexual Activity   Alcohol use: No   Drug use: No   Sexual activity: Never  Other Topics Concern   Not on file  Social History Narrative   Not on file   Social Determinants of Health   Financial Resource Strain: Not on file  Food Insecurity: Not on file  Transportation Needs: Not on file  Physical Activity: Not on file  Stress: Not on file  Social Connections: Not on file  Intimate Partner  Violence: Not on file    Review of Systems Per HPI.  Objective:   Vitals:   04/02/21 0802  BP: 132/78  Pulse: (!) 57  Resp: 16  Temp: 98.2 F (36.8 C)  TempSrc: Temporal  SpO2: 96%  Weight: 203 lb (92.1 kg)  Height: 6\' 1"  (1.854 m)     Physical Exam Vitals reviewed.  Constitutional:      Appearance: He is well-developed.  HENT:     Head: Normocephalic and atraumatic.     Nose: No congestion or rhinorrhea.     Mouth/Throat:     Mouth: Mucous membranes are moist.     Pharynx: No oropharyngeal exudate or posterior oropharyngeal erythema.  Neck:     Vascular: No carotid bruit or JVD.     Comments: Small approximately 0.5 to less than 1 cm node below the left jawline, nontender, no other appreciable lymphadenopathy in the neck, no supraclavicular lymphadenopathy. Cardiovascular:     Rate and Rhythm: Normal rate and regular rhythm.     Heart sounds: Normal heart sounds. No murmur heard. Pulmonary:     Effort: Pulmonary effort is normal.     Breath sounds: Normal breath sounds. No rales.  Abdominal:     Tenderness: There is no abdominal tenderness.     Comments: Nontender, no apparent hepatosplenomegaly.  Musculoskeletal:     Right lower leg: No edema.     Left lower leg: No edema.     Comments: Bilateral knee with skin intact, no erythema, no rash, no effusion.  Full range of motion bilaterally.  Right knee with discomfort at the medial joint line with McMurray, although reports some discomfort at medial and lateral joint lines.  Negative Lachman.  Slight crepitus.  Left knee with pain-free McMurray, slight crepitus, slight discomfort medial lateral joint lines.  No other bony tenderness of bilateral knees.  Skin:    General: Skin is warm and dry.  Neurological:     Mental Status: He is alert and oriented to person, place, and time.  Psychiatric:        Mood and Affect: Mood normal.    34 minutes spent during visit, including chart review, review of prior XR,  discussion/exam of additional concern, counseling and assimilation of information, exam, discussion of plan, and chart completion.     Assessment & Plan:  Garyn Arlotta is a 65 y.o. male . Type 2 diabetes mellitus with hyperglycemia, without long-term current use of insulin ([place]) - Plan: dapagliflozin propanediol (FARXIGA) 5 MG TABS tablet  -Uncontrolled.  Choice of GLP-1 versus SGLT2, would like to start SGLT2.  Potential side effects and risks were discussed.  Start Farxiga 5 mg initially with repeat A1c in the next 2 months.  Continue  metformin same dose.  Chronic pain of both knees - Plan: Diclofenac Sodium 1 % CREA   -Degenerative changes noted as above.  Injections cost prohibitive and have contributed to hyperglycemia.  We will try diclofenac topical, continue Tylenol, bracing/sleeve as needed.  RTC precautions.  Head and neck lymphadenopathy  -Suspected reactive lymphadenopathy with prior sore throat which has improved.  Home monitoring recommended for swollen node and if that is still present in the next few weeks, return for recheck for possible imaging, ENT eval or biopsy.  Understanding of plan expressed.  Meds ordered this encounter  Medications   dapagliflozin propanediol (FARXIGA) 5 MG TABS tablet    Sig: Take 1 tablet (5 mg total) by mouth daily before breakfast.    Dispense:  90 tablet    Refill:  1   Diclofenac Sodium 1 % CREA    Sig: Apply 4 g topically 4 (four) times daily as needed (to bilaterral knees).    Dispense:  120 g    Refill:  2   Patient Instructions  I am glad to hear the sore throat has improved.  There is a small lymph node below the left jaw.  If that is still present in the next 3 weeks, please return for recheck as may need imaging/biopsy or evaluation by specialist.  Continue metformin, but add new medication Farxiga once per day for diabetes.  Recheck in 2 months for repeat testing.  Okay to continue Tylenol, knee sleeve or brace for knee pain, and  start diclofenac topical gel up to 4 times per day.  If that is not helping I would recommend follow-up with your orthopedist.  Let me know if there are questions and thank you for coming in today.    Signed,   [name], MD [facility], [facility] 04/02/21 8:43 AM

## 2021-04-04 NOTE — Telephone Encounter (Signed)
error 

## 2021-04-05 MED ORDER — EMPAGLIFLOZIN 10 MG PO TABS: 10.0000 mg | ORAL_TABLET | Freq: Every day | ORAL | 1 refills | Status: DC

## 2021-04-05 NOTE — Telephone Encounter (Signed)
Please call pharmacy and see if Brendan Gonzales will be less expensive. If not, than can look at other options or patient assistance program.

## 2021-04-11 MED ORDER — CANAGLIFLOZIN 100 MG PO TABS: 100.0000 mg | ORAL_TABLET | Freq: Every day | ORAL | 2 refills | Status: DC

## 2021-04-11 NOTE — Addendum Note (Signed)
Addended by: [name] R on: 04/11/2021 04:05 PM   Modules accepted: Orders

## 2021-04-11 NOTE — Telephone Encounter (Signed)
Dr Carlota Raspberry - Anastasio Auerbach had the best savings program but was not on formulary and requires PA. If wanting to send invokanna, this card information could be provided to pharmacy. I have submitted PA on [employer] (covermymeds key: [id number]), but if not approved would need to utilize farxiga savings/trial programs below.  Wilder Glade and Vania Rea are both tier three per insurance. Wilder Glade has 30 day trial program then would be $150 max savings per month (pt would still need to pay $150 per month).   If further assistance is needed, I would refer to [facility] central due to (commercial ins + [facility] banner) for more comprehensive support  Invok[employer] - $0 first month, max of $200 in savings thereafter (max savings on Farxiga or Jardiance would be $150/month).    Farxiga trial - 30 days supply at no charge to pt    farxiga copay assistance - max of $150 in assistance per month. Example: if copay $300, patient would still need to spend $150

## 2021-05-07 LAB — HM DIABETES EYE EXAM

## 2021-05-09 ENCOUNTER — Encounter: Payer: Self-pay | Admitting: Family Medicine

## 2021-05-31 ENCOUNTER — Ambulatory Visit: Payer: 59 | Admitting: Family Medicine

## 2021-06-27 ENCOUNTER — Ambulatory Visit (INDEPENDENT_AMBULATORY_CARE_PROVIDER_SITE_OTHER): Payer: 59 | Admitting: Family Medicine

## 2021-06-27 VITALS — BP 138/62 | HR 64 | Temp 98.2°F | Resp 15 | Ht 73.0 in | Wt 193.4 lb

## 2021-06-27 DIAGNOSIS — Z23 Encounter for immunization: Secondary | ICD-10-CM | POA: Diagnosis not present

## 2021-06-27 DIAGNOSIS — I10 Essential (primary) hypertension: Secondary | ICD-10-CM

## 2021-06-27 DIAGNOSIS — L608 Other nail disorders: Secondary | ICD-10-CM

## 2021-06-27 DIAGNOSIS — E1165 Type 2 diabetes mellitus with hyperglycemia: Secondary | ICD-10-CM

## 2021-06-27 DIAGNOSIS — Z1211 Encounter for screening for malignant neoplasm of colon: Secondary | ICD-10-CM

## 2021-06-27 DIAGNOSIS — F439 Reaction to severe stress, unspecified: Secondary | ICD-10-CM

## 2021-06-27 DIAGNOSIS — E785 Hyperlipidemia, unspecified: Secondary | ICD-10-CM

## 2021-06-27 LAB — COMPREHENSIVE METABOLIC PANEL
ALT: 18 U/L (ref 0–53)
AST: 21 U/L (ref 0–37)
Albumin: 4.4 g/dL (ref 3.5–5.2)
Alkaline Phosphatase: 105 U/L (ref 39–117)
BUN: 8 mg/dL (ref 6–23)
CO2: 33 mEq/L — ABNORMAL HIGH (ref 19–32)
Calcium: 9.2 mg/dL (ref 8.4–10.5)
Chloride: 97 mEq/L (ref 96–112)
Creatinine, Ser: 0.66 mg/dL (ref 0.40–1.50)
GFR: 99.29 mL/min (ref >60.00)
Glucose, Bld: 127 mg/dL — ABNORMAL HIGH (ref 70–99)
Potassium: 3.8 mEq/L (ref 3.5–5.1)
Sodium: 138 mEq/L (ref 135–145)
Total Bilirubin: 0.5 mg/dL (ref 0.2–1.2)
Total Protein: 7.6 g/dL (ref 6.0–8.3)

## 2021-06-27 LAB — HEMOGLOBIN A1C: Hgb A1c MFr Bld: 7.8 % — ABNORMAL HIGH (ref 4.6–6.5)

## 2021-06-27 NOTE — Progress Notes (Signed)
Subjective:  Patient ID: Brendan Gonzales, male    DOB: [birth year]  Age: 66 y.o. MRN: [id number]  CC:  Chief Complaint  Patient presents with   Diabetes    Pt here for recheck on dm no concerns    Hypertension    Pt reports has had some higher readings recent months has had some more stress and feels hes had some BP headaches    Referral    Pt reports overgrown nail that can sometimes cause pains in his toe, reports has been a long standing issue but wonders about a more permanent solution to this     HPI Brendan Gonzales presents for   Diabetes: With hyperglycemia.  Treated with metformin 1000 mg twice daily.  He is on statin and ACE inhibitor. Added farxiga, not covered - rx invokana - cost prohibitive. Was not able to afford. See mychart message - discount card provided - he did not try.  Home readings: 147 after meal.  Fasting 120-125.  Microalbumin: nl ration in September.  Optho, foot exam, pneumovax:  Due for pneumonia vaccine today.  Referred for colonoscopy. covid booster recommended.    Immunization History  Administered Date(s) Administered   Influenza,inj,Quad PF,6+ Mos 05/31/2013, 03/07/2014, 04/28/2017, 03/12/2021   Influenza-Unspecified 04/04/2015, 05/17/2016, 03/25/2019, 03/31/2020   PFIZER(Purple Top)SARS-COV-2 Vaccination 09/05/2019, 09/25/2019, 06/17/2020, 10/05/2020   Pneumococcal Polysaccharide-23 06/13/2013   Tdap 01/23/2015     Lab Results  Component Value Date   HGBA1C 8.3 (H) 03/12/2021   HGBA1C 8.0 (H) 07/12/2020   HGBA1C 8.5 (H) 04/12/2020   Lab Results  Component Value Date   MICROALBUR 1.1 03/12/2021   LDLCALC 77 03/12/2021   CREATININE 0.71 03/12/2021   Hypertension: Lisinopril hct 20/12.30m.  Home readings: 138/97 yesterday - elevated after stressful situation.  Some increased stress with elevated readings recently.  Occasional stress headache. Family stressors - wife asking about divorce.   BP Readings from Last 3 Encounters:  06/27/21  138/62  04/02/21 132/78  03/12/21 136/64   Lab Results  Component Value Date   CREATININE 0.71 03/12/2021   [facility] 06/27/2021 04/02/2021 07/12/2020 04/12/2020 08/18/2019  Decreased Interest 0 0 0 0 -  Down, Depressed, Hopeless 0 0 0 0 0  PHQ - 2 Score 0 0 0 0 0  Denies SI. Declines counseling.   Toe pain with overgrown nail R 2nd toenail. Trouble cutting it - thickened. Sometimes nail comes off.  No podiatrist recently or locally. Had great toenail removed years ago.   History Patient Active Problem List   Diagnosis Date Noted   Hospice care patient 07/06/2019   Eczema of hand 10/19/2014   Type 2 diabetes mellitus ([place] 06/13/2013   HTN (hypertension) 06/13/2013   Normal coronary arteries- 2008 06/13/2013   Chest pain with moderate risk of acute coronary syndrome 06/12/2013   Special screening for malignant neoplasms, colon 06/27/2011   Arthritis of knee 06/14/2011   Past Medical History:  Diagnosis Date   Arthritis    Constipation    Diabetes mellitus    type 2   Dizziness    Headache(784.0)    Hyperlipidemia    Hypertension    benign   Past Surgical History:  Procedure Laterality Date   COLONOSCOPY     CORONARY ANGIOPLASTY     HEMORRHOID SURGERY     right femeroral bypass     right knee surgery     Allergies  Allergen Reactions   Metoprolol Diarrhea   Prior to Admission medications  Medication Sig Start Date End Date Taking? Authorizing Provider  acetaminophen (TYLENOL) 500 MG tablet Take 500 mg by mouth 3 (three) times daily as needed for mild pain.    Yes [provider]  aspirin 81 MG tablet Take 81 mg by mouth every morning.   Yes [provider]  atorvastatin (LIPITOR) 10 MG tablet Take 1 tablet (10 mg total) by mouth daily. 03/12/21  Yes [name], MD  canagliflozin [facility]) 100 MG TABS tablet Take 1 tablet (100 mg total) by mouth daily before breakfast. 04/11/21  Yes [name], MD  Diclofenac  Sodium 1 % CREA Apply 4 g topically 4 (four) times daily as needed (to bilaterral knees). 04/02/21  Yes [name], MD  lisinopril-hydrochlorothiazide (ZESTORETIC) 20-12.5 MG tablet Take 2 tablets by mouth daily. 03/12/21  Yes [name], MD  metFORMIN (GLUCOPHAGE) 500 MG tablet Take 2 tablets (1,000 mg total) by mouth 2 (two) times daily. 03/12/21  Yes [name], MD  pantoprazole (PROTONIX) 40 MG tablet Take 1 tablet (40 mg total) by mouth daily. 03/12/21  Yes [name], MD   Social History   Socioeconomic History   Marital status: Married    Spouse name: Not on file   Number of children: Not on file   Years of education: Not on file   Highest education level: Not on file  Occupational History   Not on file  Tobacco Use   Smoking status: Former   Smokeless tobacco: Never  Substance and Sexual Activity   Alcohol use: No   Drug use: No   Sexual activity: Never  Other Topics Concern   Not on file  Social History Narrative   Not on file   Social Determinants of Health   Financial Resource Strain: Not on file  Food Insecurity: Not on file  Transportation Needs: Not on file  Physical Activity: Not on file  Stress: Not on file  Social Connections: Not on file  Intimate Partner Violence: Not on file    Review of Systems  Constitutional:  Negative for fatigue and unexpected weight change.  Eyes:  Negative for visual disturbance.  Respiratory:  Negative for cough, chest tightness and shortness of breath.   Cardiovascular:  Negative for chest pain, palpitations and leg swelling.  Gastrointestinal:  Negative for abdominal pain and blood in stool.  Neurological:  Negative for dizziness, weakness, light-headedness and headaches (with stress only.).    Objective:   Vitals:   06/27/21 1048  BP: 138/62  Pulse: 64  Resp: 15  Temp: 98.2 F (36.8 C)  TempSrc: Temporal  SpO2: 97%  Weight: 193 lb 6.4 oz (87.7 kg)  Height: '6\' 1"'  (1.854 m)      Physical Exam Vitals reviewed.  Constitutional:      Appearance: He is well-developed.  HENT:     Head: Normocephalic and atraumatic.  Neck:     Vascular: No carotid bruit or JVD.  Cardiovascular:     Rate and Rhythm: Normal rate and regular rhythm.     Heart sounds: Normal heart sounds. No murmur heard. Pulmonary:     Effort: Pulmonary effort is normal.     Breath sounds: Normal breath sounds. No rales.  Musculoskeletal:     Right lower leg: No edema.     Left lower leg: No edema.  Skin:    General: Skin is warm and dry.  Neurological:     Mental Status: He is alert and oriented to person,  place, and time.  Psychiatric:        Mood and Affect: Mood normal.       Assessment & Plan:  Brendan Gonzales is a 66 y.o. male . Type 2 diabetes mellitus with hyperglycemia, without long-term current use of insulin (HCC) - Plan: Comprehensive metabolic panel, Hemoglobin A1c  -Check A1c.  Provided discount card information again for Invokana if persistent elevated A1c, continue metformin same dose.  Hyperlipidemia, unspecified hyperlipidemia type  -Tolerating statin, continue same  Need for pneumococcal vaccination - Plan: Pneumococcal conjugate vaccine 20-valent (Prevnar 20)  Screen for colon cancer - Plan: Ambulatory referral to Gastroenterology  Toenail deformity - Plan: Ambulatory referral to Podiatry  -Appears to be onychomycotic nail with curved/deformation.  May need excision but will refer to podiatry to evaluate options.  Situational stress  -Denies needs at this time, handout given on management of stress.  Essential hypertension  -Discussed appropriate timing of blood pressure measurement with RTC precautions.  No med changes for now  No orders of the defined types were placed in this encounter.  Patient Instructions  Depending on A1c, I would consider the farxiga or Invokana as recommended at your last visit.   Here is a possible discount card info for  [id number] [id number] Number ID [id number] If that does not work, let me know.   I will refer you for colonoscopy. I recommend the new covid bivalent booster vaccine at your pharmacy.  Pneumonia vaccine given today.   See info on checking your blood pressure. If elevated at rest, let me know.   I am sorry to hear about your stressors. Let me know if we can help. See info below, but I am happy to provide names for counseling if needed.    How to Take Your Blood Pressure Blood pressure is a measurement of how strongly your blood is pressing against the walls of your arteries. Arteries are blood vessels that carry blood from your heart throughout your body. Your health care provider takes your blood pressure at each office visit. You can also take your own blood pressure at home with a blood pressure monitor. You may need to take your own blood pressure to: Confirm a diagnosis of high blood pressure (hypertension). Monitor your blood pressure over time. Make sure your blood pressure medicine is working. Supplies needed: Blood pressure monitor. Dining room chair to sit in. Table or desk. Small notebook and pencil or pen. How to prepare To get the most accurate reading, avoid the following for 30 minutes before you check your blood pressure: Drinking caffeine. Drinking alcohol. Eating. Smoking. Exercising. Five minutes before you check your blood pressure: Use the bathroom and urinate so that you have an empty bladder. Sit quietly in a dining room chair. Do not sit in a soft couch or an armchair. Do not talk. How to take your blood pressure To check your blood pressure, follow the instructions in the manual that came with your blood pressure monitor. If you have a digital blood pressure monitor, the instructions may be as follows: Sit up straight in a chair. Place your feet on the floor. Do not cross your ankles or legs. Rest your left arm at the level of your heart  on a table or desk or on the arm of a chair. Pull up your shirt sleeve. Wrap the blood pressure cuff around the upper part of your left arm, 1 inch (2.5 cm) above your elbow. It is best to wrap the  cuff around bare skin. Fit the cuff snugly around your arm. You should be able to place only one finger between the cuff and your arm. Position the cord so that it rests in the bend of your elbow. Press the power button. Sit quietly while the cuff inflates and deflates. Read the digital reading on the monitor screen and write the numbers down (record them) in a notebook. Wait 2-3 minutes, then repeat the steps, starting at step 1. What does my blood pressure reading mean? A blood pressure reading consists of a higher number over a lower number. Ideally, your blood pressure should be below 120/80. The first ("top") number is called the systolic pressure. It is a measure of the pressure in your arteries as your heart beats. The second ("bottom") number is called the diastolic pressure. It is a measure of the pressure in your arteries as the heart relaxes. Blood pressure is classified into five stages. The following are the stages for adults who do not have a short-term serious illness or a chronic condition. Systolic pressure and diastolic pressure are measured in a unit called mm Hg (millimeters of mercury).  Normal Systolic pressure: below 703. Diastolic pressure: below 80. Elevated Systolic pressure: 500-938. Diastolic pressure: below 80. Hypertension stage 1 Systolic pressure: 182-993. Diastolic pressure: 71-69. Hypertension stage 2 Systolic pressure: 678 or above. Diastolic pressure: 90 or above. You can have elevated blood pressure or hypertension even if only the systolic or only the diastolic number in your reading is higher than normal. Follow these instructions at home: Medicines Take over-the-counter and prescription medicines only as told by your health care provider. Tell your health  care provider if you are having any side effects from blood pressure medicine. General instructions Check your blood pressure as often as recommended by your health care provider. Check your blood pressure at the same time every day. Take your monitor to the next appointment with your health care provider to make sure that: You are using it correctly. It provides accurate readings. Understand what your goal blood pressure numbers are. Keep all follow-up visits as told by your health care provider. This is important. General tips Your health care provider can suggest a reliable monitor that will meet your needs. There are several types of home blood pressure monitors. Choose a monitor that has an arm cuff. Do not choose a monitor that measures your blood pressure from your wrist or finger. Choose a cuff that wraps snugly around your upper arm. You should be able to fit only one finger between your arm and the cuff. You can buy a blood pressure monitor at most drugstores or online. Where to find more information American Heart Association: www.heart.org Contact a health care provider if: Your blood pressure is consistently high. Your blood pressure is suddenly low. Get help right away if: Your systolic blood pressure is higher than 180. Your diastolic blood pressure is higher than 120. Summary Blood pressure is a measurement of how strongly your blood is pressing against the walls of your arteries. A blood pressure reading consists of a higher number over a lower number. Ideally, your blood pressure should be below 120/80. Check your blood pressure at the same time every day. Avoid caffeine, alcohol, smoking, and exercise for 30 minutes prior to checking your blood pressure. These agents can affect the accuracy of the blood pressure reading. This information is not intended to replace advice given to you by your health care provider. Make sure you discuss any questions  you have with your  health care provider. Document Revised: 04/19/2020 Document Reviewed: 06/04/2019 Elsevier Patient Education  2022 [place], Adult Stress is a normal reaction to life events. Stress is what you feel when life demands more than you are used to, or more than you think you can handle. Some stress can be useful, such as studying for a test or meeting a deadline at work. Stress that occurs too often or for too long can cause problems. Long-lasting stress is called chronic stress. Chronic stress can affect your emotional health and interfere with relationships and normal daily activities. Too much stress can weaken your body's defense system (immune system) and increase your risk for physical illness. If you already have a medical problem, stress can make it worse. What are the causes? All sorts of life events can cause stress. An event that causes stress for one person may not be stressful for someone else. Major life events, whether positive or negative, commonly cause stress. Examples include: Losing a job or starting a new job. Losing a loved one. Moving to a new town or home. Getting married or divorced. Having a baby. Getting injured or sick. Less obvious life events can also cause stress, especially if they occur day after day or in combination with each other. Examples include: Working long hours. Driving in traffic. Caring for children. Being in debt. Being in a difficult relationship. What are the signs or symptoms? Stress can cause emotional and physical symptoms and can lead to unhealthy behaviors. These include the following: Emotional symptoms Anxiety. This is feeling worried, afraid, on edge, overwhelmed, or out of control. Anger, including irritation or impatience. Depression. This is feeling sad, down, helpless, or guilty. Trouble focusing, remembering, or making decisions. Physical symptoms Aches and pains. These may affect your head, neck, back, stomach, or  other areas of your body. Tight muscles or a clenched jaw. Low energy. Trouble sleeping. Unhealthy behaviors Eating to feel better (overeating) or skipping meals. Working too much or putting off tasks. Smoking, drinking alcohol, or using drugs to feel better. How is this diagnosed? A stress disorder is diagnosed through an assessment by your health care provider. A stress disorder may be diagnosed based on: Your symptoms and any stressful life events. Your medical history. Tests to rule out other causes of your symptoms. Depending on your condition, your health care provider may refer you to a specialist for further evaluation. How is this treated? Stress management techniques are the recommended treatment for stress. Medicine is not typically recommended for treating stress. Techniques to reduce your reaction to stressful life events include: Identifying stress. Monitor yourself for symptoms of stress and notice what causes stress for you. These skills may help you to avoid or prepare for stressful events. Managing time. Set your priorities, keep a calendar of events, and learn to say no. These actions can help you avoid taking on too much. Techniques for dealing with stress include: Rethinking the problem. Try to think realistically about stressful events rather than ignoring them or overreacting. Try to find the positives in a stressful situation rather than focusing on the negatives. Exercise. Physical exercise can release both physical and emotional tension. The key is to find a form of exercise that you enjoy and do it regularly. Relaxation techniques. These relax the body and mind. Find one or more that you enjoy and use the techniques regularly. Examples include: Meditation, deep breathing, or progressive relaxation techniques. Yoga or tai chi. Biofeedback, mindfulness  techniques, or journaling. Listening to music, being in nature, or taking part in other hobbies. Practicing a  healthy lifestyle. Eat a balanced diet, drink plenty of water, limit or avoid caffeine, and get plenty of sleep. Having a strong support network. Spend time with family, friends, or other people you enjoy being around. Express your feelings and talk things over with someone you trust. Counseling or talk therapy with a mental health provider may help if you are having trouble managing stress by yourself. Follow these instructions at home: Lifestyle  Avoid drugs. Do not use any products that contain nicotine or tobacco. These products include cigarettes, chewing tobacco, and vaping devices, such as e-cigarettes. If you need help quitting, ask your health care provider. If you drink alcohol: Limit how much you have to: 0-1 drink a day for women who are not pregnant. 0-2 drinks a day for men. Know how much alcohol is in a drink. In the [place] one drink equals one 12 oz bottle of beer (355 mL), one 5 oz glass of wine (148 mL), or one 1 oz glass of hard liquor (44 mL). Do not use alcohol or drugs to relax. Eat a balanced diet that includes fresh fruits and vegetables, whole grains, lean meats, fish, eggs, beans, and low-fat dairy. Avoid processed foods and foods high in added fat, sugar, and salt. Exercise at least 30 minutes on 5 or more days each week. Get 7-8 hours of sleep each night. General instructions  Practice stress management techniques as told by your health care provider. Drink enough fluid to keep your urine pale yellow. Take over-the-counter and prescription medicines only as told by your health care provider. Keep all follow-up visits. This is important. Contact a health care provider if: Your symptoms get worse. You have new symptoms. You feel overwhelmed by your problems and can no longer manage them by yourself. Get help right away if: You have thoughts of hurting yourself or others. Get help right awayif you feel like you may hurt yourself or others, or have thoughts about  taking your own life. Go to your nearest emergency room or: Call 911. Call the [facility] at (270)314-2993 or 988. This is open 24 hours a day. Text the Crisis Text Line at (414) 257-4214. Summary Stress is a normal reaction to life events. It can cause problems if it happens too often or for too long. Practicing stress management techniques is the best way to treat stress. Counseling or talk therapy with a mental health provider may help if you are having trouble managing stress by yourself. This information is not intended to replace advice given to you by your health care provider. Make sure you discuss any questions you have with your health care provider. Document Revised: 01/18/2021 Document Reviewed: 01/18/2021 Elsevier Patient Education  2022 [place],   [name], MD [place] 06/27/21 12:07 PM

## 2021-06-27 NOTE — Patient Instructions (Addendum)
Depending on A1c, I would consider the farxiga or Invokana as recommended at your last visit.   Here is a possible discount card info for [employer] [id number] [employer] [id number] Number ID [id number] If that does not work, let me know.   I will refer you for colonoscopy. I recommend the new covid bivalent booster vaccine at your pharmacy.  Pneumonia vaccine given today.   See info on checking your blood pressure. If elevated at rest, let me know.   I am sorry to hear about your stressors. Let me know if we can help. See info below, but I am happy to provide names for counseling if needed.    How to Take Your Blood Pressure Blood pressure is a measurement of how strongly your blood is pressing against the walls of your arteries. Arteries are blood vessels that carry blood from your heart throughout your body. Your health care provider takes your blood pressure at each office visit. You can also take your own blood pressure at home with a blood pressure monitor. You may need to take your own blood pressure to: Confirm a diagnosis of high blood pressure (hypertension). Monitor your blood pressure over time. Make sure your blood pressure medicine is working. Supplies needed: Blood pressure monitor. Dining room chair to sit in. Table or desk. Small notebook and pencil or pen. How to prepare To get the most accurate reading, avoid the following for 30 minutes before you check your blood pressure: Drinking caffeine. Drinking alcohol. Eating. Smoking. Exercising. Five minutes before you check your blood pressure: Use the bathroom and urinate so that you have an empty bladder. Sit quietly in a dining room chair. Do not sit in a soft couch or an armchair. Do not talk. How to take your blood pressure To check your blood pressure, follow the instructions in the manual that came with your blood pressure monitor. If you have a digital blood pressure monitor, the instructions may be as  follows: Sit up straight in a chair. Place your feet on the floor. Do not cross your ankles or legs. Rest your left arm at the level of your heart on a table or desk or on the arm of a chair. Pull up your shirt sleeve. Wrap the blood pressure cuff around the upper part of your left arm, 1 inch (2.5 cm) above your elbow. It is best to wrap the cuff around bare skin. Fit the cuff snugly around your arm. You should be able to place only one finger between the cuff and your arm. Position the cord so that it rests in the bend of your elbow. Press the power button. Sit quietly while the cuff inflates and deflates. Read the digital reading on the monitor screen and write the numbers down (record them) in a notebook. Wait 2-3 minutes, then repeat the steps, starting at step 1. What does my blood pressure reading mean? A blood pressure reading consists of a higher number over a lower number. Ideally, your blood pressure should be below 120/80. The first ("top") number is called the systolic pressure. It is a measure of the pressure in your arteries as your heart beats. The second ("bottom") number is called the diastolic pressure. It is a measure of the pressure in your arteries as the heart relaxes. Blood pressure is classified into five stages. The following are the stages for adults who do not have a short-term serious illness or a chronic condition. Systolic pressure and diastolic pressure are measured in a  unit called mm Hg (millimeters of mercury).  Normal Systolic pressure: below 992. Diastolic pressure: below 80. Elevated Systolic pressure: 426-834. Diastolic pressure: below 80. Hypertension stage 1 Systolic pressure: 196-222. Diastolic pressure: 97-98. Hypertension stage 2 Systolic pressure: 921 or above. Diastolic pressure: 90 or above. You can have elevated blood pressure or hypertension even if only the systolic or only the diastolic number in your reading is higher than normal. Follow  these instructions at home: Medicines Take over-the-counter and prescription medicines only as told by your health care provider. Tell your health care provider if you are having any side effects from blood pressure medicine. General instructions Check your blood pressure as often as recommended by your health care provider. Check your blood pressure at the same time every day. Take your monitor to the next appointment with your health care provider to make sure that: You are using it correctly. It provides accurate readings. Understand what your goal blood pressure numbers are. Keep all follow-up visits as told by your health care provider. This is important. General tips Your health care provider can suggest a reliable monitor that will meet your needs. There are several types of home blood pressure monitors. Choose a monitor that has an arm cuff. Do not choose a monitor that measures your blood pressure from your wrist or finger. Choose a cuff that wraps snugly around your upper arm. You should be able to fit only one finger between your arm and the cuff. You can buy a blood pressure monitor at most drugstores or online. Where to find more information American Heart Association: www.heart.org Contact a health care provider if: Your blood pressure is consistently high. Your blood pressure is suddenly low. Get help right away if: Your systolic blood pressure is higher than 180. Your diastolic blood pressure is higher than 120. Summary Blood pressure is a measurement of how strongly your blood is pressing against the walls of your arteries. A blood pressure reading consists of a higher number over a lower number. Ideally, your blood pressure should be below 120/80. Check your blood pressure at the same time every day. Avoid caffeine, alcohol, smoking, and exercise for 30 minutes prior to checking your blood pressure. These agents can affect the accuracy of the blood pressure  reading. This information is not intended to replace advice given to you by your health care provider. Make sure you discuss any questions you have with your health care provider. Document Revised: 04/19/2020 Document Reviewed: 06/04/2019 Elsevier Patient Education  2022 [place], Adult Stress is a normal reaction to life events. Stress is what you feel when life demands more than you are used to, or more than you think you can handle. Some stress can be useful, such as studying for a test or meeting a deadline at work. Stress that occurs too often or for too long can cause problems. Long-lasting stress is called chronic stress. Chronic stress can affect your emotional health and interfere with relationships and normal daily activities. Too much stress can weaken your body's defense system (immune system) and increase your risk for physical illness. If you already have a medical problem, stress can make it worse. What are the causes? All sorts of life events can cause stress. An event that causes stress for one person may not be stressful for someone else. Major life events, whether positive or negative, commonly cause stress. Examples include: Losing a job or starting a new job. Losing a loved one. Moving to a  new town or home. Getting married or divorced. Having a baby. Getting injured or sick. Less obvious life events can also cause stress, especially if they occur day after day or in combination with each other. Examples include: Working long hours. Driving in traffic. Caring for children. Being in debt. Being in a difficult relationship. What are the signs or symptoms? Stress can cause emotional and physical symptoms and can lead to unhealthy behaviors. These include the following: Emotional symptoms Anxiety. This is feeling worried, afraid, on edge, overwhelmed, or out of control. Anger, including irritation or impatience. Depression. This is feeling sad, down,  helpless, or guilty. Trouble focusing, remembering, or making decisions. Physical symptoms Aches and pains. These may affect your head, neck, back, stomach, or other areas of your body. Tight muscles or a clenched jaw. Low energy. Trouble sleeping. Unhealthy behaviors Eating to feel better (overeating) or skipping meals. Working too much or putting off tasks. Smoking, drinking alcohol, or using drugs to feel better. How is this diagnosed? A stress disorder is diagnosed through an assessment by your health care provider. A stress disorder may be diagnosed based on: Your symptoms and any stressful life events. Your medical history. Tests to rule out other causes of your symptoms. Depending on your condition, your health care provider may refer you to a specialist for further evaluation. How is this treated? Stress management techniques are the recommended treatment for stress. Medicine is not typically recommended for treating stress. Techniques to reduce your reaction to stressful life events include: Identifying stress. Monitor yourself for symptoms of stress and notice what causes stress for you. These skills may help you to avoid or prepare for stressful events. Managing time. Set your priorities, keep a calendar of events, and learn to say no. These actions can help you avoid taking on too much. Techniques for dealing with stress include: Rethinking the problem. Try to think realistically about stressful events rather than ignoring them or overreacting. Try to find the positives in a stressful situation rather than focusing on the negatives. Exercise. Physical exercise can release both physical and emotional tension. The key is to find a form of exercise that you enjoy and do it regularly. Relaxation techniques. These relax the body and mind. Find one or more that you enjoy and use the techniques regularly. Examples include: Meditation, deep breathing, or progressive relaxation  techniques. Yoga or tai chi. Biofeedback, mindfulness techniques, or journaling. Listening to music, being in nature, or taking part in other hobbies. Practicing a healthy lifestyle. Eat a balanced diet, drink plenty of water, limit or avoid caffeine, and get plenty of sleep. Having a strong support network. Spend time with family, friends, or other people you enjoy being around. Express your feelings and talk things over with someone you trust. Counseling or talk therapy with a mental health provider may help if you are having trouble managing stress by yourself. Follow these instructions at home: Lifestyle  Avoid drugs. Do not use any products that contain nicotine or tobacco. These products include cigarettes, chewing tobacco, and vaping devices, such as e-cigarettes. If you need help quitting, ask your health care provider. If you drink alcohol: Limit how much you have to: 0-1 drink a day for women who are not pregnant. 0-2 drinks a day for men. Know how much alcohol is in a drink. In the [place] one drink equals one 12 oz bottle of beer (355 mL), one 5 oz glass of wine (148 mL), or one 1 oz glass of hard  liquor (44 mL). Do not use alcohol or drugs to relax. Eat a balanced diet that includes fresh fruits and vegetables, whole grains, lean meats, fish, eggs, beans, and low-fat dairy. Avoid processed foods and foods high in added fat, sugar, and salt. Exercise at least 30 minutes on 5 or more days each week. Get 7-8 hours of sleep each night. General instructions  Practice stress management techniques as told by your health care provider. Drink enough fluid to keep your urine pale yellow. Take over-the-counter and prescription medicines only as told by your health care provider. Keep all follow-up visits. This is important. Contact a health care provider if: Your symptoms get worse. You have new symptoms. You feel overwhelmed by your problems and can no longer manage them by  yourself. Get help right away if: You have thoughts of hurting yourself or others. Get help right awayif you feel like you may hurt yourself or others, or have thoughts about taking your own life. Go to your nearest emergency room or: Call 911. Call the [facility] at [phone] or [phone]. This is open 24 hours a day. Text the Crisis Text Line at 2064428541. Summary Stress is a normal reaction to life events. It can cause problems if it happens too often or for too long. Practicing stress management techniques is the best way to treat stress. Counseling or talk therapy with a mental health provider may help if you are having trouble managing stress by yourself. This information is not intended to replace advice given to you by your health care provider. Make sure you discuss any questions you have with your health care provider. Document Revised: 01/18/2021 Document Reviewed: 01/18/2021 Elsevier Patient Education  [place].

## 2021-08-08 ENCOUNTER — Encounter: Payer: Self-pay | Admitting: Family Medicine

## 2021-10-03 ENCOUNTER — Ambulatory Visit (INDEPENDENT_AMBULATORY_CARE_PROVIDER_SITE_OTHER): Payer: 59 | Admitting: Family Medicine

## 2021-10-03 VITALS — BP 118/72 | HR 60 | Temp 98.3°F | Resp 16 | Ht 73.0 in | Wt 194.4 lb

## 2021-10-03 DIAGNOSIS — E11649 Type 2 diabetes mellitus with hypoglycemia without coma: Secondary | ICD-10-CM | POA: Diagnosis not present

## 2021-10-03 DIAGNOSIS — K219 Gastro-esophageal reflux disease without esophagitis: Secondary | ICD-10-CM

## 2021-10-03 DIAGNOSIS — E785 Hyperlipidemia, unspecified: Secondary | ICD-10-CM

## 2021-10-03 DIAGNOSIS — I10 Essential (primary) hypertension: Secondary | ICD-10-CM

## 2021-10-03 DIAGNOSIS — Z1211 Encounter for screening for malignant neoplasm of colon: Secondary | ICD-10-CM

## 2021-10-03 LAB — LIPID PANEL
Cholesterol: 155 mg/dL (ref 0–200)
HDL: 51.6 mg/dL (ref >39.00)
LDL Cholesterol: 71 mg/dL (ref 0–99)
NonHDL: 103.32
Total CHOL/HDL Ratio: 3
Triglycerides: 161 mg/dL — ABNORMAL HIGH (ref 0.0–149.0)
VLDL: 32.2 mg/dL (ref 0.0–40.0)

## 2021-10-03 LAB — COMPREHENSIVE METABOLIC PANEL
ALT: 13 U/L (ref 0–53)
AST: 16 U/L (ref 0–37)
Albumin: 4.4 g/dL (ref 3.5–5.2)
Alkaline Phosphatase: 99 U/L (ref 39–117)
BUN: 10 mg/dL (ref 6–23)
CO2: 30 mEq/L (ref 19–32)
Calcium: 8.9 mg/dL (ref 8.4–10.5)
Chloride: 91 mEq/L — ABNORMAL LOW (ref 96–112)
Creatinine, Ser: 0.66 mg/dL (ref 0.40–1.50)
GFR: 99.1 mL/min (ref >60.00)
Glucose, Bld: 153 mg/dL — ABNORMAL HIGH (ref 70–99)
Potassium: 4 mEq/L (ref 3.5–5.1)
Sodium: 129 mEq/L — ABNORMAL LOW (ref 135–145)
Total Bilirubin: 0.4 mg/dL (ref 0.2–1.2)
Total Protein: 7.1 g/dL (ref 6.0–8.3)

## 2021-10-03 LAB — POCT GLYCOSYLATED HEMOGLOBIN (HGB A1C): Hemoglobin A1C: 7.3 % — AB (ref 4.0–5.6)

## 2021-10-03 LAB — GLUCOSE, POCT (MANUAL RESULT ENTRY): POC Glucose: 178 mg/dl — AB (ref 70–99)

## 2021-10-03 MED ORDER — PANTOPRAZOLE SODIUM 40 MG PO TBEC: 40.0000 mg | DELAYED_RELEASE_TABLET | Freq: Every day | ORAL | 1 refills | Status: DC

## 2021-10-03 MED ORDER — METFORMIN HCL 500 MG PO TABS: 1000.0000 mg | ORAL_TABLET | Freq: Two times a day (BID) | ORAL | 1 refills | Status: DC

## 2021-10-03 MED ORDER — ATORVASTATIN CALCIUM 10 MG PO TABS: 10.0000 mg | ORAL_TABLET | Freq: Every day | ORAL | 1 refills | Status: DC

## 2021-10-03 MED ORDER — LISINOPRIL-HYDROCHLOROTHIAZIDE 20-12.5 MG PO TABS: 2.0000 | ORAL_TABLET | Freq: Every day | ORAL | 1 refills | Status: DC

## 2021-10-03 NOTE — Progress Notes (Signed)
? ?Subjective:  ?Patient ID: Brendan Gonzales, male    DOB: [birth year]  Age: 66 y.o. MRN: [id number] ? ?CC:  ?Chief Complaint  ?Patient presents with  ? Diabetes  ?  Pt notes no concerns feels well, pt notes he is traveling to [place] soon and will need 3 months supply of medication for that trip, notes invokana was too expensive did not get this   ? Hyperlipidemia  ?  Due for recheck no concerns   ? Hypertension  ?  Due for recheck, notes occasional headache no other sxs    ? ? ?HPI ?Brendan Gonzales presents for  ? ?Diabetes: ?Complicated by hyperglycemia.  Some difficulty with medication coverage.  He is treated with metformin 1000 mg twice daily.  Wilder Glade was not covered, Invokana was cost prohibitive.  We did provide a discount card but he had not tried it as of his last visit in January. ?Did not try card.  ?He is on statin, ACE inhibitor ?Home readings fasting: 119. Day 21 of Ramadan.  ?Home readings postprandial (eating at 7pm). 173 ?No sx lows.  ?Microalbumin: Normal ratio 03/12/21 ?Optho, foot exam, pneumovax: up to date ?Leaving for [place] May 14th for 3 months. Every year.  ?Last ate at 5am - 6 hrs ago.  ? ?Lab Results  ?Component Value Date  ? HGBA1C 7.3 (A) 10/03/2021  ? HGBA1C 7.8 (H) 06/27/2021  ? HGBA1C 8.3 (H) 03/12/2021  ? ?Lab Results  ?Component Value Date  ? MICROALBUR 1.1 03/12/2021  ? Am[place]ita 77 03/12/2021  ? CREATININE 0.66 06/27/2021  ? ?GERD: ?Stable with daily protonix. ? ?Hypertension: ?Lisinopril HCTZ 20/12.5 mg 2/day.no new side effects. Rare HA. No new se's.  ?Home readings: 137/83 ?BP Readings from Last 3 Encounters:  ?10/03/21 118/72  ?06/27/21 138/62  ?04/02/21 132/78  ? ?Lab Results  ?Component Value Date  ? CREATININE 0.66 06/27/2021  ? ?Hyperlipidemia: ?Lipitor 10 mg daily,  no new myalgias.  ?Lab Results  ?Component Value Date  ? CHOL 145 03/12/2021  ? HDL 46.70 03/12/2021  ? Jones 77 03/12/2021  ? TRIG 104.0 03/12/2021  ? CHOLHDL 3 03/12/2021  ? ?Lab Results  ?Component Value Date  ?  ALT 18 06/27/2021  ? AST 21 06/27/2021  ? ALKPHOS 105 06/27/2021  ? BILITOT 0.5 06/27/2021  ? ? ? ?History ?Patient Active Problem List  ? Diagnosis Date Noted  ? Hospice care patient 07/06/2019  ? Eczema of hand 10/19/2014  ? Type 2 diabetes mellitus ([place]) 06/13/2013  ? HTN (hypertension) 06/13/2013  ? Normal coronary arteries- 2008 06/13/2013  ? Chest pain with moderate risk of acute coronary syndrome 06/12/2013  ? Special screening for malignant neoplasms, colon 06/27/2011  ? Arthritis of knee 06/14/2011  ? ?Past Medical History:  ?Diagnosis Date  ? Arthritis   ? Constipation   ? Diabetes mellitus   ? type 2  ? Dizziness   ? Headache(784.0)   ? Hyperlipidemia   ? Hypertension   ? benign  ? ?Past Surgical History:  ?Procedure Laterality Date  ? COLONOSCOPY    ? CORONARY ANGIOPLASTY    ? HEMORRHOID SURGERY    ? right femeroral bypass    ? right knee surgery    ? ?Allergies  ?Allergen Reactions  ? Metoprolol Diarrhea  ? ?Prior to Admission medications   ?Medication Sig Start Date End Date Taking? Authorizing Provider  ?acetaminophen (TYLENOL) 500 MG tablet Take 500 mg by mouth 3 (three) times daily as needed for mild pain.  Yes [provider]  ?aspirin 81 MG tablet Take 81 mg by mouth every morning.   Yes [provider]  ?atorvastatin (LIPITOR) 10 MG tablet Take 1 tablet (10 mg total) by mouth daily. 03/12/21  Yes [name], MD  ?Diclofenac Sodium 1 % CREA Apply 4 g topically 4 (four) times daily as needed (to bilaterral knees). 04/02/21  Yes [name], MD  ?lisinopril-hydrochlorothiazide (ZESTORETIC) 20-12.5 MG tablet Take 2 tablets by mouth daily. 03/12/21  Yes [name], MD  ?metFORMIN (GLUCOPHAGE) 500 MG tablet Take 2 tablets (1,000 mg total) by mouth 2 (two) times daily. 03/12/21  Yes [name], MD  ?pantoprazole (PROTONIX) 40 MG tablet Take 1 tablet (40 mg total) by mouth daily. 03/12/21  Yes [name], MD  ?canagliflozin (INVOKANA) 100 MG TABS tablet  Take 1 tablet (100 mg total) by mouth daily before breakfast. ?Patient not taking: Reported on 10/03/2021 04/11/21   [name], MD  ? ?Social History  ? ?Socioeconomic History  ? Marital status: Married  ?  Spouse name: Not on file  ? Number of children: Not on file  ? Years of education: Not on file  ? Highest education level: Not on file  ?Occupational History  ? Not on file  ?Tobacco Use  ? Smoking status: Former  ? Smokeless tobacco: Never  ?Substance and Sexual Activity  ? Alcohol use: No  ? Drug use: No  ? Sexual activity: Never  ?Other Topics Concern  ? Not on file  ?Social History Narrative  ? Not on file  ? ?Social Determinants of Health  ? ?Financial Resource Strain: Not on file  ?Food Insecurity: Not on file  ?Transportation Needs: Not on file  ?Physical Activity: Not on file  ?Stress: Not on file  ?Social Connections: Not on file  ?Intimate Partner Violence: Not on file  ? ? ?Review of Systems  ?Constitutional:  Negative for fatigue and unexpected weight change.  ?Eyes:  Negative for visual disturbance.  ?Respiratory:  Negative for cough, chest tightness and shortness of breath.   ?Cardiovascular:  Negative for chest pain, palpitations and leg swelling.  ?Gastrointestinal:  Negative for abdominal pain and blood in stool.  ?Neurological:  Positive for headaches (rare, fleeting.). Negative for dizziness and light-headedness.  ? ? ?Objective:  ? ?Vitals:  ? 10/03/21 1011  ?BP: 118/72  ?Pulse: 60  ?Resp: 16  ?Temp: 98.3 ?F (36.8 ?C)  ?TempSrc: Temporal  ?SpO2: 97%  ?Weight: 194 lb 6.4 oz (88.2 kg)  ?Height: '6\' 1"'$  (1.854 m)  ? ? ? ?Physical Exam ?Vitals reviewed.  ?Constitutional:   ?   Appearance: He is well-developed.  ?HENT:  ?   Head: Normocephalic and atraumatic.  ?Neck:  ?   Vascular: No carotid bruit or JVD.  ?Cardiovascular:  ?   Rate and Rhythm: Normal rate and regular rhythm.  ?   Heart sounds: Normal heart sounds. No murmur heard. ?Pulmonary:  ?   Effort: Pulmonary effort is normal.  ?    Breath sounds: Normal breath sounds. No rales.  ?Musculoskeletal:  ?   Right lower leg: No edema.  ?   Left lower leg: No edema.  ?Skin: ?   General: Skin is warm and dry.  ?Neurological:  ?   Mental Status: He is alert and oriented to person, place, and time.  ?Psychiatric:     ?   Mood and Affect: Mood normal.  ? ? ? ?Results for orders placed or  performed in visit on 10/03/21  ?POCT glucose (manual entry)  ?Result Value Ref Range  ? POC Glucose 178 (A) 70 - 99 mg/dl  ?POCT glycosylated hemoglobin (Hb A1C)  ?Result Value Ref Range  ? Hemoglobin A1C 7.3 (A) 4.0 - 5.6 %  ? HbA1c POC (<> result, manual entry)    ? HbA1c, POC (prediabetic range)    ? HbA1c, POC (controlled diabetic range)    ? ? ? ?Assessment & Plan:  ?Nickholas Goldston is a 66 y.o. male . ?Type 2 diabetes mellitus with hypoglycemia without coma, without long-term current use of insulin ([place]) - Plan: metFORMIN (GLUCOPHAGE) 500 MG tablet, POCT glucose (manual entry), POCT glycosylated hemoglobin (Hb A1C) ? -Improved control.  Near goal.  Continue same dose of metformin.  Improve diet, exercise discussed and recheck levels when he is back from [place] in approximately 4 months. ? ?Essential hypertension - Plan: lisinopril-hydrochlorothiazide (ZESTORETIC) 20-12.5 MG tablet, Comprehensive metabolic panel ? -Stable on current regimen.  Continue same labs ordered. ? ?Gastroesophageal reflux disease, unspecified whether esophagitis present - Plan: pantoprazole (PROTONIX) 40 MG tablet ? -Stable with PPI, continue same. ? ?Hyperlipidemia, unspecified hyperlipidemia type - Plan: atorvastatin (LIPITOR) 10 MG tablet, Lipid panel ? -Tolerating statin, updated labs ordered, continue same dose Lipitor ? ?Screening for colon cancer - Plan: Ambulatory referral to Gastroenterology ?Due for colonoscopy this year, plans to have performed after he returns from [place].  ? ?Meds ordered this encounter  ?Medications  ? lisinopril-hydrochlorothiazide (ZESTORETIC) 20-12.5 MG tablet   ?  Sig: Take 2 tablets by mouth daily.  ?  Dispense:  180 tablet  ?  Refill:  1  ? pantoprazole (PROTONIX) 40 MG tablet  ?  Sig: Take 1 tablet (40 mg total) by mouth daily.  ?  Dispense:  90 tablet  ?  Re

## 2021-10-03 NOTE — Patient Instructions (Addendum)
Diabetes control has improved.  Okay to stay on the same medication regimen if you can and activity/exercise, likely will have the A1c below 7.  Recheck levels once you return from [place].  Please schedule visit at that time.  No medication changes today. ?I placed a referral for colonoscopy after you return from your trip. Have a great trip.  ? ?Type 2 Diabetes Mellitus, Self-Care, Adult ?When you have type 2 diabetes (type 2 diabetes mellitus), you must make sure your blood sugar (glucose) stays in a healthy range. You can do this with: ?Nutrition. ?Exercise. ?Lifestyle changes. ?Medicines or insulin, if needed. ?Support from your doctors and others. ?What are the risks? ?Having type 2 diabetes can raise your risk for other long-term (chronic) health problems. You may get medicines to help prevent these problems. ?How to stay aware of your blood sugar ? ?Check your blood sugar level every day, as often as told. ?Have your A1C (hemoglobin A1C) level checked two or more times a year. Have it checked more often if told. ?Your doctor will set personal treatment goals for you. In general, you should have these blood sugar levels: ?Before meals: 80-130 mg/dL (4.4-7.2 mmol/L). ?After meals: below 180 mg/dL (10 mmol/L). ?A1C: less than 7%. ?How to manage high and low blood sugar ?Symptoms of high blood sugar ?High blood sugar is also called hyperglycemia. Know the symptoms of high blood sugar. These may include: ?More thirst. ?Hunger. ?Feeling very tired. ?Needing to pee (urinate) more often than normal. ?Seeing things blurry. ?Symptoms of low blood sugar ?Low blood sugar is also called hypoglycemia. This is when blood sugar is at or below 70 mg/dL (3.9 mmol/L). Symptoms may include: ?Hunger. ?Feeling worried or nervous (anxious). ?Feeling sweaty and cold to the touch (clammy). ?Being dizzy or light-headed. ?Feeling sleepy. ?A fast heartbeat. ?Feeling grouchy (irritable). ?Tingling or loss of feeling (numbness) around  your mouth, lips, or tongue. ?Restless sleep. ?Diabetes medicines can cause low blood sugar. You are more at risk: ?While you exercise. ?After exercise. ?During sleep. ?When you are sick. ?When you skip meals or do not eat for a long time. ?Treating low blood sugar ?If you think you have low blood sugar, eat or drink something sugary right away. Keep 15 grams of a fast-acting carb (carbohydrate) with you all the time. Make sure your family and friends know how to treat you if you cannot treat yourself. ?Treating very low blood sugar ?Severe hypoglycemia is when your blood sugar is at or below 54 mg/dL (3 mmol/L). ?Severe hypoglycemia is an emergency. Get medical help right away. Call your local emergency services (911 in the [place] ?Do not wait to see if the symptoms will go away. ?Do not drive yourself to the hospital. ?You may need a glucagon shot if you have very low blood sugar and you cannot eat or drink. Have a family member or friend learn how to check your blood sugar and how to give you a glucagon shot. Ask your doctor if you should have a kit for glucagon shots. ?Follow these instructions at home: ?Medicines ?Take prescribed insulin or diabetes medicines as told by your health care provider. ?Do not run out of insulin or other medicines. Plan ahead. ?If you use insulin, change the amount you take based on how active you are and what foods you eat. Your doctor will tell you how to do this. ?Take over-the-counter and prescription medicines only as told by your doctor. ?Eating and drinking ? ?Eat healthy foods. These  include: ?Low-fat (lean) proteins. ?Complex carbs, such as whole grains. ?Fresh fruits and vegetables. ?Low-fat dairy products. ?Healthy fats. ?Meet with a food expert (dietitian) to make an eating plan. ?Follow instructions from your doctor about what you cannot eat or drink. ?Drink enough fluid to keep your pee (urine) pale yellow. ?Keep track of carbs that you eat. Read food labels and learn  serving sizes of foods. ?Follow your sick-day plan when you cannot eat or drink as normal. Make this plan with your doctor so it is ready to use. ?Activity ?Exercise as told by your doctor. You may need to: ?Do stretching and strength exercises two or more times a week. ?Do 150 minutes or more of exercise each week that makes your heart beat faster and makes you sweat. ?Spread out your exercise over 3 or more days a week. ?Do not go more than 2 days in a row without exercise. ?Talk with your doctor before you start a new exercise. Your doctor may tell you to change: ?How much insulin or medicines you take. ?How much food you eat. ?Lifestyle ?Do not smoke or use any products that contain nicotine or tobacco. If you need help quitting, ask your doctor. ?If you drink alcohol and your doctor says that it is safe for you: ?Limit how much you have to: ?0-1 drink a day for women who are not pregnant. ?0-2 drinks a day for men. ?Know how much alcohol is in your drink. In the [place] one drink equals one 12 oz bottle of beer (355 mL), one 5 oz glass of wine (148 mL), or one 1? oz glass of hard liquor (44 mL). ?Learn to deal with stress. If you need help, ask your doctor. ?Body care ? ?Stay up to date with your shots (immunizations). ?Have your eyes and feet checked by a doctor as often as told. ?Check your skin and feet every day. Check for cuts, bruises, redness, blisters, or sores. ?Brush your teeth and gums two times a day. Floss one or more times a day. ?Go to the dentist one or more times every 6 months. ?Stay at a healthy weight. ?General instructions ?Share your diabetes care plan with: ?Your work or school. ?People you live with. ?Carry a card or wear jewelry that says you have diabetes. ?Keep all follow-up visits. ?Questions to ask your doctor ?Do I need to meet with a certified expert in diabetes education and care? ?Where can I find a support group? ?Where to find more information ?For help and guidance and more  information about diabetes, please go to: ?American Diabetes Association: www.diabetes.org ?American Association of Diabetes Care and Education Specialists: www.diabeteseducator.org ?International Diabetes Federation: MemberVerification.ca ?Summary ?When you have type 2 diabetes, you must make sure your blood sugar (glucose) stays in a healthy range. You can do this with nutrition, exercise, medicines and insulin, and support from doctors and others. ?Check your blood sugar every day, or as often as told. ?Having diabetes can raise your risk for other long-term health problems. You may get medicines to help prevent these problems. ?Share your diabetes management plan with people at work, school, and home. ?Keep all follow-up visits. ?This information is not intended to replace advice given to you by your health care provider. Make sure you discuss any questions you have with your health care provider. ?Document Revised: 09/04/2020 Document Reviewed: 09/04/2020 ?Elsevier Patient Education ? [place]. ? ?

## 2021-10-07 ENCOUNTER — Other Ambulatory Visit: Payer: Self-pay | Admitting: Family Medicine

## 2021-10-07 DIAGNOSIS — E871 Hypo-osmolality and hyponatremia: Secondary | ICD-10-CM

## 2021-10-07 NOTE — Progress Notes (Signed)
Bmp future order for hyponatremia. ?

## 2021-10-09 NOTE — Progress Notes (Signed)
Pt advised of results and has been scheduled for repeat next week

## 2021-10-10 ENCOUNTER — Other Ambulatory Visit (INDEPENDENT_AMBULATORY_CARE_PROVIDER_SITE_OTHER): Payer: 59

## 2021-10-10 DIAGNOSIS — E871 Hypo-osmolality and hyponatremia: Secondary | ICD-10-CM | POA: Diagnosis not present

## 2021-10-10 LAB — BASIC METABOLIC PANEL
BUN: 9 mg/dL (ref 6–23)
CO2: 31 mEq/L (ref 19–32)
Calcium: 8.9 mg/dL (ref 8.4–10.5)
Chloride: 96 mEq/L (ref 96–112)
Creatinine, Ser: 0.65 mg/dL (ref 0.40–1.50)
GFR: 99.54 mL/min (ref >60.00)
Glucose, Bld: 166 mg/dL — ABNORMAL HIGH (ref 70–99)
Potassium: 4 mEq/L (ref 3.5–5.1)
Sodium: 133 mEq/L — ABNORMAL LOW (ref 135–145)

## 2021-10-15 ENCOUNTER — Other Ambulatory Visit: Payer: 59

## 2022-02-06 ENCOUNTER — Ambulatory Visit: Payer: 59 | Admitting: Family Medicine

## 2022-03-04 ENCOUNTER — Encounter: Payer: Self-pay | Admitting: Family Medicine

## 2022-03-04 ENCOUNTER — Ambulatory Visit (INDEPENDENT_AMBULATORY_CARE_PROVIDER_SITE_OTHER): Payer: Medicare HMO | Admitting: Family Medicine

## 2022-03-04 VITALS — BP 140/86 | HR 69 | Temp 98.4°F | Ht 73.0 in | Wt 190.4 lb

## 2022-03-04 DIAGNOSIS — E11649 Type 2 diabetes mellitus with hypoglycemia without coma: Secondary | ICD-10-CM

## 2022-03-04 DIAGNOSIS — R300 Dysuria: Secondary | ICD-10-CM

## 2022-03-04 DIAGNOSIS — N342 Other urethritis: Secondary | ICD-10-CM | POA: Diagnosis not present

## 2022-03-04 DIAGNOSIS — I10 Essential (primary) hypertension: Secondary | ICD-10-CM | POA: Diagnosis not present

## 2022-03-04 DIAGNOSIS — E785 Hyperlipidemia, unspecified: Secondary | ICD-10-CM | POA: Diagnosis not present

## 2022-03-04 DIAGNOSIS — Z23 Encounter for immunization: Secondary | ICD-10-CM

## 2022-03-04 LAB — COMPREHENSIVE METABOLIC PANEL
ALT: 12 U/L (ref 0–53)
AST: 15 U/L (ref 0–37)
Albumin: 4 g/dL (ref 3.5–5.2)
Alkaline Phosphatase: 104 U/L (ref 39–117)
BUN: 7 mg/dL (ref 6–23)
CO2: 30 mEq/L (ref 19–32)
Calcium: 9.5 mg/dL (ref 8.4–10.5)
Chloride: 95 mEq/L — ABNORMAL LOW (ref 96–112)
Creatinine, Ser: 0.72 mg/dL (ref 0.40–1.50)
GFR: 96.25 mL/min (ref >60.00)
Glucose, Bld: 108 mg/dL — ABNORMAL HIGH (ref 70–99)
Potassium: 3.7 mEq/L (ref 3.5–5.1)
Sodium: 133 mEq/L — ABNORMAL LOW (ref 135–145)
Total Bilirubin: 0.5 mg/dL (ref 0.2–1.2)
Total Protein: 7.4 g/dL (ref 6.0–8.3)

## 2022-03-04 LAB — POCT URINALYSIS DIP (MANUAL ENTRY)
Bilirubin, UA: NEGATIVE
Glucose, UA: NEGATIVE mg/dL
Ketones, POC UA: NEGATIVE mg/dL
Leukocytes, UA: NEGATIVE
Nitrite, UA: NEGATIVE
Protein Ur, POC: NEGATIVE mg/dL
Spec Grav, UA: 1.02 (ref 1.010–1.025)
Urobilinogen, UA: 0.2 E.U./dL
pH, UA: 7 (ref 5.0–8.0)

## 2022-03-04 LAB — LIPID PANEL
Cholesterol: 115 mg/dL (ref 0–200)
HDL: 44.5 mg/dL (ref >39.00)
LDL Cholesterol: 60 mg/dL (ref 0–99)
NonHDL: 70.48
Total CHOL/HDL Ratio: 3
Triglycerides: 54 mg/dL (ref 0.0–149.0)
VLDL: 10.8 mg/dL (ref 0.0–40.0)

## 2022-03-04 LAB — HEMOGLOBIN A1C: Hgb A1c MFr Bld: 7.6 % — ABNORMAL HIGH (ref 4.6–6.5)

## 2022-03-04 LAB — URINALYSIS, MICROSCOPIC ONLY

## 2022-03-04 MED ORDER — DOXYCYCLINE HYCLATE 100 MG PO TABS: 100.0000 mg | ORAL_TABLET | Freq: Two times a day (BID) | ORAL | 0 refills | Status: DC

## 2022-03-04 NOTE — Patient Instructions (Signed)
Based on your symptoms I suspect you have an infection in the urethra or urethritis.  See information below.  Start antibiotic twice per day and continue drink plenty of fluids.  If the symptoms are not improving as the week goes on or any new or worsening symptoms (including worsening abdominal pain, fevers, blood in the urine or other worsening symptoms), be seen right away.  Recheck next week and we can review labs from today as well as decide if other testing needed.  Return to the clinic or go to the nearest emergency room if any of your symptoms worsen or new symptoms occur.  Urethritis, Adult  Urethritis is a swelling (inflammation) of the urethra. The urethra is the tube that drains urine from the bladder. It is important to get treatment for this condition early. Delayed treatment may lead to complications, such as an infection in the urinary tract (ureters, kidneys, and bladder). What are the causes? This condition may be caused by: Germs that are spread through sexual contact. This is the leading cause of urethritis. This may include bacterial or viral infections. Injury to the urethra. This can happen after a thin, flexible tube (catheter) is inserted into the urethra to drain urine, or after medical instruments or foreign bodies are inserted into the area. Chemical irritation. This may include contact with spermicide or prolonged contact with chemicals in bubble bath, shampoo, or perfumed soaps. A disease that causes inflammation. This is rare. What increases the risk? The following factors may make you more likely to develop this condition: Having sex without using a condom. Having multiple sexual partners. Having poor hygiene. What are the signs or symptoms? Symptoms of this condition include: Pain with urination. Frequent urination. An urgent need to urinate. Itching and pain in the vagina or penis. Discharge or bleeding coming from the penis. Most women have no  symptoms. How is this diagnosed? This condition may be diagnosed based on: Your symptoms. Your medical history. A physical exam. Tests may also be done. These may include: Urine tests. Swabs from the urethra. How is this treated? Treatment for this condition depends on the cause. Urethritis caused by a bacterial infection is treated with antibiotic medicine. Sexual partners must also be treated. Follow these instructions at home: Medicines Take over-the-counter and prescription medicines only as told by your health care provider. If you were prescribed an antibiotic, take it as told by your health care provider. Do not stop taking the antibiotic even if you start to feel better. Lifestyle Avoid using perfumed soaps, bubble bath, and shampoo when you bathe or shower. Rinse the vaginal area after bathing. Wear cotton underwear. Not wearing underwear when going to sleep can help. Make sure to wipe from front to back after using the toilet if you are male. Do not have sex until your health care provider approves. When you do have sex, be sure to practice safe sex. Any sexual partners you have had in the past 60 days should be treated. General instructions Drink enough fluid to keep your urine pale yellow. It is up to you to get your test results. Ask your health care provider, or the department that is doing the test, when your results will be ready. Keep all follow-up visits. This is important. Get tested again 3 months after treatment to make sure the infection is gone. It is important that your sexual partner also gets tested again. Contact a health care provider if: Your symptoms have not improved after 3 days. Your  symptoms get worse. You have eye redness or pain. You develop abdominal pain or pelvic pain (in females). You develop joint pain or a rash. You have a fever or chills. Get help right away if: You have severe pain in the belly, back, or side. You vomit  repeatedly. Summary Urethritis is a swelling (inflammation) of the urethra. Germs that are spread through sexual contact are the most common cause of this condition. It is important to get treatment for this condition early. Delayed treatment may lead to complications. Treatment for this condition depends on the cause. Any sexual partners must also be treated. This information is not intended to replace advice given to you by your health care provider. Make sure you discuss any questions you have with your health care provider. Document Revised: 01/16/2020 Document Reviewed: 01/16/2020 Elsevier Patient Education  [place].

## 2022-03-04 NOTE — Progress Notes (Addendum)
Subjective:  Patient ID: Brendan Gonzales, male    DOB: 06/27/1955  Age: 66 y.o. MRN: [id number]  CC:  Chief Complaint  Patient presents with   urine    Pt states he has pain when he goes to the bathroom, and pain in the lower back, pt felt dizzy when he tried to use the bathroom    HPI Brendan Gonzales presents for   Dysuria: Started 1 week ago. Penile discomfort, some lower flank pain to low back pain past few days. No hematuria. No hx of nephrolith. Siblings with kidney stones.  Some white penile discharge past week.  No new sexual partners. No recent sexual activity.  Single episode of feeling dizziness after urinating few days ago. Some soreness with urinating at that time. No recurrence of dizziness - feels ok now.  Drinking fluids.  No fever.   Leaving 10/8 for trip out of country.    Diabetes: With hyperglycemia. Metformin '1000mg'$  BID. Other agents cost prohibitive in past.  On statin, ACE inhibitor.  No lows. Recent reading 177 Microalbumin: nl ratio 03/12/21.  Optho, foot exam, pneumovax:utd.   Lab Results  Component Value Date   HGBA1C 7.3 (A) 10/03/2021   HGBA1C 7.8 (H) 06/27/2021   HGBA1C 8.3 (H) 03/12/2021   Lab Results  Component Value Date   MICROALBUR 1.1 03/12/2021   LDLCALC 71 10/03/2021   CREATININE 0.65 10/10/2021   Hypertension: Lisinopril hctz 20/12.'5mg'$   No missed doses.  Home readings: 145/80 last night. Usually lower.  Some stress with family health and stressors in his country - [place].  No new side effects with meds.  BP Readings from Last 3 Encounters:  03/04/22 (!) 140/86  10/03/21 118/72  06/27/21 138/62   Lab Results  Component Value Date   CREATININE 0.65 10/10/2021      History Patient Active Problem List   Diagnosis Date Noted   Hospice care patient 07/06/2019   Eczema of hand 10/19/2014   Type 2 diabetes mellitus ([place]) 06/13/2013   HTN (hypertension) 06/13/2013   Normal coronary arteries- 2008 06/13/2013   Chest pain with  moderate risk of acute coronary syndrome 06/12/2013   Special screening for malignant neoplasms, colon 06/27/2011   Arthritis of knee 06/14/2011   Past Medical History:  Diagnosis Date   Arthritis    Constipation    Diabetes mellitus    type 2   Dizziness    Headache(784.0)    Hyperlipidemia    Hypertension    benign   Past Surgical History:  Procedure Laterality Date   COLONOSCOPY     CORONARY ANGIOPLASTY     HEMORRHOID SURGERY     right femeroral bypass     right knee surgery     Allergies  Allergen Reactions   Metoprolol Diarrhea   Prior to Admission medications   Medication Sig Start Date End Date Taking? Authorizing Provider  acetaminophen (TYLENOL) 500 MG tablet Take 500 mg by mouth 3 (three) times daily as needed for mild pain.    Yes [provider]  aspirin 81 MG tablet Take 81 mg by mouth every morning.   Yes [provider]  atorvastatin (LIPITOR) 10 MG tablet Take 1 tablet (10 mg total) by mouth daily. 10/03/21  Yes [name], MD  Diclofenac Sodium 1 % CREA Apply 4 g topically 4 (four) times daily as needed (to bilaterral knees). 04/02/21  Yes [name], MD  lisinopril-hydrochlorothiazide (ZESTORETIC) 20-12.5 MG tablet Take 2 tablets by mouth daily. 10/03/21  Yes [name], MD  metFORMIN (GLUCOPHAGE) 500 MG tablet Take 2 tablets (1,000 mg total) by mouth 2 (two) times daily. 10/03/21  Yes [name], MD  pantoprazole (PROTONIX) 40 MG tablet Take 1 tablet (40 mg total) by mouth daily. 10/03/21  Yes [name], MD  canagliflozin [facility]) 100 MG TABS tablet Take 1 tablet (100 mg total) by mouth daily before breakfast. Patient not taking: Reported on 10/03/2021 04/11/21   [name], MD   Social History   Socioeconomic History   Marital status: Married    Spouse name: Not on file   Number of children: Not on file   Years of education: Not on file   Highest education level: Not on file  Occupational  History   Not on file  Tobacco Use   Smoking status: Former   Smokeless tobacco: Never  Substance and Sexual Activity   Alcohol use: No   Drug use: No   Sexual activity: Never  Other Topics Concern   Not on file  Social History Narrative   Not on file   Social Determinants of Health   Financial Resource Strain: Not on file  Food Insecurity: Not on file  Transportation Needs: Not on file  Physical Activity: Not on file  Stress: Not on file  Social Connections: Not on file  Intimate Partner Violence: Not on file    Review of Systems Per HPI.   Objective:   Vitals:   03/04/22 1035  BP: (!) 140/86  Pulse: 69  Temp: 98.4 F (36.9 C)  SpO2: 99%  Weight: 190 lb 6.4 oz (86.4 kg)  Height: '6\' 1"'$  (1.854 m)     Physical Exam Vitals reviewed.  Constitutional:      Appearance: He is well-developed.  HENT:     Head: Normocephalic and atraumatic.  Neck:     Vascular: No carotid bruit or JVD.  Cardiovascular:     Rate and Rhythm: Normal rate and regular rhythm.     Heart sounds: Normal heart sounds. No murmur heard. Pulmonary:     Effort: Pulmonary effort is normal.     Breath sounds: Normal breath sounds. No rales.  Abdominal:     Comments: Minimal suprapubic tenderness without rebound or guarding.  No CVA tenderness.  Describes area of discomfort at the paraspinals of lower back.  No midline bony tenderness.  Genitourinary:    Penis: Normal.      Comments: Epididymis nontender, no testicular tenderness or swelling.  No visible penile discharge or penile lesions. Musculoskeletal:     Right lower leg: No edema.     Left lower leg: No edema.  Skin:    General: Skin is warm and dry.  Neurological:     Mental Status: He is alert and oriented to person, place, and time.  Psychiatric:        Mood and Affect: Mood normal.     Results for orders placed or performed in visit on 03/04/22  POCT urinalysis dipstick  Result Value Ref Range   Color, UA yellow yellow    Clarity, UA cloudy (A) clear   Glucose, UA negative negative mg/dL   Bilirubin, UA negative negative   Ketones, POC UA negative negative mg/dL   Spec Grav, UA 1.020 1.010 - 1.025   Blood, UA trace-intact (A) negative   pH, UA 7.0 5.0 - 8.0   Protein Ur, POC negative negative mg/dL   Urobilinogen, UA 0.2 0.2 or 1.0 E.U./dL   Nitrite,  UA Negative Negative   Leukocytes, UA Negative Negative    Assessment & Plan:  Brendan Gonzales is a 66 y.o. male . Infective urethritis - Plan: doxycycline (VIBRA-TABS) 100 MG tablet, Urine cytology ancillary only Dysuria - Plan: POCT urinalysis dipstick, Urine Microscopic, Urine Culture, doxycycline (VIBRA-TABS) 100 MG tablet  -urethritis by symptoms, check labs including urinalysis, culture, STI screening although less likely based on risk factors above.  Start doxycycline, potential side effects discussed, RTC precautions given.  Recheck next 2 weeks  Essential hypertension - Plan: Comprehensive metabolic panel  -Check labs, borderline in office, plan for recheck next few weeks, no med changes for now.  Type 2 diabetes mellitus with hypoglycemia without coma, without long-term current use of insulin (HCC) - Plan: Hemoglobin A1c  -Check A1c, close follow-up to discuss med regimen as may still need more than just metformin.  Hyperlipidemia, unspecified hyperlipidemia type - Plan: Lipid panel  -Tolerating Lipitor 10 mg daily, continue same, check lipids.  Need for influenza vaccination - Plan: Flu Vaccine QUAD 6+ mos PF IM (Fluarix Quad PF)  No orders of the defined types were placed in this encounter.  Patient Instructions  Based on your symptoms I suspect you have an infection in the urethra or urethritis.  See information below.  Start antibiotic twice per day and continue drink plenty of fluids.  If the symptoms are not improving as the week goes on or any new or worsening symptoms (including worsening abdominal pain, fevers, blood in the urine or other  worsening symptoms), be seen right away.  Recheck next week and we can review labs from today as well as decide if other testing needed.  Return to the clinic or go to the nearest emergency room if any of your symptoms worsen or new symptoms occur.  Urethritis, Adult  Urethritis is a swelling (inflammation) of the urethra. The urethra is the tube that drains urine from the bladder. It is important to get treatment for this condition early. Delayed treatment may lead to complications, such as an infection in the urinary tract (ureters, kidneys, and bladder). What are the causes? This condition may be caused by: Germs that are spread through sexual contact. This is the leading cause of urethritis. This may include bacterial or viral infections. Injury to the urethra. This can happen after a thin, flexible tube (catheter) is inserted into the urethra to drain urine, or after medical instruments or foreign bodies are inserted into the area. Chemical irritation. This may include contact with spermicide or prolonged contact with chemicals in bubble bath, shampoo, or perfumed soaps. A disease that causes inflammation. This is rare. What increases the risk? The following factors may make you more likely to develop this condition: Having sex without using a condom. Having multiple sexual partners. Having poor hygiene. What are the signs or symptoms? Symptoms of this condition include: Pain with urination. Frequent urination. An urgent need to urinate. Itching and pain in the vagina or penis. Discharge or bleeding coming from the penis. Most women have no symptoms. How is this diagnosed? This condition may be diagnosed based on: Your symptoms. Your medical history. A physical exam. Tests may also be done. These may include: Urine tests. Swabs from the urethra. How is this treated? Treatment for this condition depends on the cause. Urethritis caused by a bacterial infection is treated with  antibiotic medicine. Sexual partners must also be treated. Follow these instructions at home: Medicines Take over-the-counter and prescription medicines only as told by your  health care provider. If you were prescribed an antibiotic, take it as told by your health care provider. Do not stop taking the antibiotic even if you start to feel better. Lifestyle Avoid using perfumed soaps, bubble bath, and shampoo when you bathe or shower. Rinse the vaginal area after bathing. Wear cotton underwear. Not wearing underwear when going to sleep can help. Make sure to wipe from front to back after using the toilet if you are male. Do not have sex until your health care provider approves. When you do have sex, be sure to practice safe sex. Any sexual partners you have had in the past 60 days should be treated. General instructions Drink enough fluid to keep your urine pale yellow. It is up to you to get your test results. Ask your health care provider, or the department that is doing the test, when your results will be ready. Keep all follow-up visits. This is important. Get tested again 3 months after treatment to make sure the infection is gone. It is important that your sexual partner also gets tested again. Contact a health care provider if: Your symptoms have not improved after 3 days. Your symptoms get worse. You have eye redness or pain. You develop abdominal pain or pelvic pain (in females). You develop joint pain or a rash. You have a fever or chills. Get help right away if: You have severe pain in the belly, back, or side. You vomit repeatedly. Summary Urethritis is a swelling (inflammation) of the urethra. Germs that are spread through sexual contact are the most common cause of this condition. It is important to get treatment for this condition early. Delayed treatment may lead to complications. Treatment for this condition depends on the cause. Any sexual partners must also be  treated. This information is not intended to replace advice given to you by your health care provider. Make sure you discuss any questions you have with your health care provider. Document Revised: 01/16/2020 Document Reviewed: 01/16/2020 Elsevier Patient Education  [place],   [name], MD [place] 03/04/22 10:52 AM

## 2022-03-05 LAB — URINE CULTURE
MICRO NUMBER:: 13899539
Result:: NO GROWTH
SPECIMEN QUALITY:: ADEQUATE

## 2022-03-11 ENCOUNTER — Ambulatory Visit (INDEPENDENT_AMBULATORY_CARE_PROVIDER_SITE_OTHER): Payer: Medicare HMO | Admitting: Family Medicine

## 2022-03-11 ENCOUNTER — Encounter: Payer: Self-pay | Admitting: Family Medicine

## 2022-03-11 ENCOUNTER — Other Ambulatory Visit: Payer: Self-pay

## 2022-03-11 VITALS — BP 140/70 | HR 80 | Temp 97.9°F | Resp 19 | Ht 73.0 in | Wt 199.0 lb

## 2022-03-11 DIAGNOSIS — I10 Essential (primary) hypertension: Secondary | ICD-10-CM | POA: Diagnosis not present

## 2022-03-11 DIAGNOSIS — R300 Dysuria: Secondary | ICD-10-CM | POA: Diagnosis not present

## 2022-03-11 DIAGNOSIS — N342 Other urethritis: Secondary | ICD-10-CM

## 2022-03-11 DIAGNOSIS — E1165 Type 2 diabetes mellitus with hyperglycemia: Secondary | ICD-10-CM | POA: Diagnosis not present

## 2022-03-11 LAB — POCT URINALYSIS DIP (MANUAL ENTRY)
Bilirubin, UA: NEGATIVE
Glucose, UA: NEGATIVE mg/dL
Ketones, POC UA: NEGATIVE mg/dL
Leukocytes, UA: NEGATIVE
Nitrite, UA: NEGATIVE
Protein Ur, POC: NEGATIVE mg/dL
Spec Grav, UA: 1.01 (ref 1.010–1.025)
Urobilinogen, UA: 0.2 E.U./dL
pH, UA: 6 (ref 5.0–8.0)

## 2022-03-11 MED ORDER — AMLODIPINE BESYLATE 2.5 MG PO TABS: 2.5000 mg | ORAL_TABLET | Freq: Every day | ORAL | 1 refills | Status: DC

## 2022-03-11 MED ORDER — GLIMEPIRIDE 1 MG PO TABS: 1.0000 mg | ORAL_TABLET | Freq: Every day | ORAL | 1 refills | Status: DC

## 2022-03-11 MED ORDER — AZITHROMYCIN 250 MG PO TABS: ORAL_TABLET | ORAL | 0 refills | Status: AC

## 2022-03-11 NOTE — Progress Notes (Signed)
Subjective:  Patient ID: Brendan Gonzales, male    DOB: May 18, 1956  Age: 66 y.o. MRN: [id number]  CC:  Chief Complaint  Patient presents with   Follow-up    Patient states he is here for a follow up . Patient states that the doxycyline was making him get headaches so he stopped taking it    HPI Brendan Gonzales presents for   Urethritis: Follow-up from 1 week ago.  Previously had 1 week history of dysuria, penile discomfort, lower flank pain to low back pain for a few days without hematuria.  Started on doxycycline.  Urine culture no growth, however 7-10 white blood cells on urine micro.  Reports headaches 1 hour after taking doxycyline, and some heart palpitations when taking doxycycline - resolved with tylenol. No further back or flank pain. Minimal burning with urination, but better.   Diabetes: With hyperglycemia Metformin 1000 mg twice daily, similar agents have been cost prohibitive, but now has medicare. May have better coverage.  A1c elevated at 7.6 last visit. Years ago he did take glimepiride. Glucose 107 last night, 148-177  Microalbumin: Normal ratio 03/12/2021  Lab Results  Component Value Date   HGBA1C 7.6 (H) 03/04/2022   HGBA1C 7.3 (A) 10/03/2021   HGBA1C 7.8 (H) 06/27/2021   Lab Results  Component Value Date   MICROALBUR 1.1 03/12/2021   LDLCALC 60 03/04/2022   CREATININE 0.72 03/04/2022   Hypertension: Lisinopril HCTZ total 40/'25mg'$  daily.  Few elevated readings recently.  Home reading 145/80 discussed last visit, but also had lower readings.  Some family stressors were discussed. Home readings: 142/81 BP Readings from Last 3 Encounters:  03/11/22 (!) 140/70  03/04/22 (!) 140/86  10/03/21 118/72   Lab Results  Component Value Date   CREATININE 0.72 03/04/2022      History Patient Active Problem List   Diagnosis Date Noted   Hospice care patient 07/06/2019   Eczema of hand 10/19/2014   Type 2 diabetes mellitus ([place]) 06/13/2013   HTN (hypertension)  06/13/2013   Normal coronary arteries- 2008 06/13/2013   Chest pain with moderate risk of acute coronary syndrome 06/12/2013   Special screening for malignant neoplasms, colon 06/27/2011   Arthritis of knee 06/14/2011   Past Medical History:  Diagnosis Date   Arthritis    Constipation    Diabetes mellitus    type 2   Dizziness    Headache(784.0)    Hyperlipidemia    Hypertension    benign   Past Surgical History:  Procedure Laterality Date   COLONOSCOPY     CORONARY ANGIOPLASTY     HEMORRHOID SURGERY     right femeroral bypass     right knee surgery     Allergies  Allergen Reactions   Metoprolol Diarrhea   Prior to Admission medications   Medication Sig Start Date End Date Taking? Authorizing Provider  acetaminophen (TYLENOL) 500 MG tablet Take 500 mg by mouth 3 (three) times daily as needed for mild pain.    Yes [provider]  aspirin 81 MG tablet Take 81 mg by mouth every morning.   Yes [provider]  atorvastatin (LIPITOR) 10 MG tablet Take 1 tablet (10 mg total) by mouth daily. 10/03/21  Yes [name], MD  doxycycline (VIBRA-TABS) 100 MG tablet Take 1 tablet (100 mg total) by mouth 2 (two) times daily. 03/04/22  Yes [name], MD  lisinopril-hydrochlorothiazide (ZESTORETIC) 20-12.5 MG tablet Take 2 tablets by mouth daily. 10/03/21  Yes [name]  R, MD  metFORMIN (GLUCOPHAGE) 500 MG tablet Take 2 tablets (1,000 mg total) by mouth 2 (two) times daily. 10/03/21  Yes [name], MD  pantoprazole (PROTONIX) 40 MG tablet Take 1 tablet (40 mg total) by mouth daily. 10/03/21  Yes [name], MD  canagliflozin [facility]) 100 MG TABS tablet Take 1 tablet (100 mg total) by mouth daily before breakfast. Patient not taking: Reported on 10/03/2021 04/11/21   [name], MD  Diclofenac Sodium 1 % CREA Apply 4 g topically 4 (four) times daily as needed (to bilaterral knees). Patient not taking: Reported on 03/11/2022 04/02/21    [name], MD   Social History   Socioeconomic History   Marital status: Married    Spouse name: Not on file   Number of children: Not on file   Years of education: Not on file   Highest education level: Not on file  Occupational History   Not on file  Tobacco Use   Smoking status: Former   Smokeless tobacco: Never  Substance and Sexual Activity   Alcohol use: No   Drug use: No   Sexual activity: Never  Other Topics Concern   Not on file  Social History Narrative   Not on file   Social Determinants of Health   Financial Resource Strain: Not on file  Food Insecurity: Not on file  Transportation Needs: Not on file  Physical Activity: Not on file  Stress: Not on file  Social Connections: Not on file  Intimate Partner Violence: Not on file    Review of Systems  Per HPI.  Objective:   Vitals:   03/11/22 0827 03/11/22 0831  BP: (!) 142/68 (!) 140/70  Pulse: 80   Resp: 19   Temp: 97.9 F (36.6 C)   TempSrc: Temporal   SpO2: 99%   Weight: 199 lb (90.3 kg)   Height: '6\' 1"'$  (1.854 m)      Physical Exam Vitals reviewed.  Constitutional:      Appearance: He is well-developed.  HENT:     Head: Normocephalic and atraumatic.  Neck:     Vascular: No carotid bruit or JVD.  Cardiovascular:     Rate and Rhythm: Normal rate and regular rhythm.     Heart sounds: Normal heart sounds. No murmur heard. Pulmonary:     Effort: Pulmonary effort is normal.     Breath sounds: Normal breath sounds. No rales.  Abdominal:     General: Bowel sounds are normal. There is no distension.     Tenderness: There is no abdominal tenderness. There is no right CVA tenderness, left CVA tenderness or guarding.  Musculoskeletal:     Right lower leg: No edema.     Left lower leg: No edema.  Skin:    General: Skin is warm and dry.  Neurological:     General: No focal deficit present.     Mental Status: He is alert and oriented to person, place, and time.  Psychiatric:         Mood and Affect: Mood normal.      Results for orders placed or performed in visit on 03/11/22  POCT urinalysis dipstick  Result Value Ref Range   Color, UA yellow yellow   Clarity, UA clear clear   Glucose, UA negative negative mg/dL   Bilirubin, UA negative negative   Ketones, POC UA negative negative mg/dL   Spec Grav, UA 1.010 1.010 - 1.025   Blood, UA trace-intact (A) negative  pH, UA 6.0 5.0 - 8.0   Protein Ur, POC negative negative mg/dL   Urobilinogen, UA 0.2 0.2 or 1.0 E.U./dL   Nitrite, UA Negative Negative   Leukocytes, UA Negative Negative     Assessment & Plan:  Brendan Gonzales is a 66 y.o. male . Infective urethritis - Plan: azithromycin (ZITHROMAX) 250 MG tablet Dysuria - Plan: POCT urinalysis dipstick  -Improving but still symptomatic.  Headache, palpitations with doxycycline.  Quickly improved with Tylenol, but due to side effects and persistent symptoms we will stop doxycycline, start azithromycin.  RTC precautions if persistent symptoms with this treatment, or new/worsening symptoms.  Also instructed to follow-up if palpitations, headache recur off the doxycycline.  Type 2 diabetes mellitus with hyperglycemia, without long-term current use of insulin (HCC) - Plan: glimepiride (AMARYL) 1 MG tablet  -Still uncontrolled.  Other agents have been cost prohibitive.  He has taken sulfonylurea in the past.  Start low-dose glimepiride for now, continue metformin same dose.  Ideally would like to see him on SGLT2 but unsure of coverage with new insurance.  Asked him to check coverage of 2 different agents.  I would want to see if the current urethritis symptoms improve first as well.  Essential hypertension - Plan: amLODipine (NORVASC) 2.5 MG tablet  -Decreased control, continue lisinopril/HCTZ at max dose, add amlodipine 2.5 mg with potential side effects discussed.  Recheck 3 months  Meds ordered this encounter  Medications   glimepiride (AMARYL) 1 MG tablet    Sig:  Take 1 tablet (1 mg total) by mouth daily with breakfast.    Dispense:  90 tablet    Refill:  1   azithromycin (ZITHROMAX) 250 MG tablet    Sig: Take 2 tablets on day 1, then 1 tablet daily on days 2 through 5    Dispense:  6 tablet    Refill:  0   amLODipine (NORVASC) 2.5 MG tablet    Sig: Take 1 tablet (2.5 mg total) by mouth daily.    Dispense:  90 tablet    Refill:  1   Patient Instructions  Try adding glimepiride once per day. Continue. metformin same dose. Check with your new insurance and see if they would cover a medicine called Jardiance or [place].   For urethritis (urinary infection), stop doxycycline due to the side effects. Start azithromycin. Follow up if symptoms do not resolve with this treatment.   Blood pressure still too high. Add the new med amlodipine once per day. Continue other meds.   Return to the clinic or go to the nearest emergency room if any of your symptoms worsen or new symptoms occur.  Take care and have a safe trip in October.     Signed,   [name], MD [place] 03/11/22 9:20 AM

## 2022-03-11 NOTE — Patient Instructions (Addendum)
Try adding glimepiride once per day. Continue. metformin same dose. Check with your new insurance and see if they would cover a medicine called Jardiance or [place].   For urethritis (urinary infection), stop doxycycline due to the side effects. Start azithromycin. Follow up if symptoms do not resolve with this treatment.   Blood pressure still too high. Add the new med amlodipine once per day. Continue other meds.   Return to the clinic or go to the nearest emergency room if any of your symptoms worsen or new symptoms occur.  Take care and have a safe trip in October.

## 2022-03-27 ENCOUNTER — Other Ambulatory Visit: Payer: Self-pay | Admitting: Family Medicine

## 2022-03-27 DIAGNOSIS — E785 Hyperlipidemia, unspecified: Secondary | ICD-10-CM

## 2022-03-27 DIAGNOSIS — K219 Gastro-esophageal reflux disease without esophagitis: Secondary | ICD-10-CM

## 2022-06-16 ENCOUNTER — Other Ambulatory Visit: Payer: Self-pay | Admitting: Family Medicine

## 2022-06-16 DIAGNOSIS — I10 Essential (primary) hypertension: Secondary | ICD-10-CM

## 2022-06-16 DIAGNOSIS — E11649 Type 2 diabetes mellitus with hypoglycemia without coma: Secondary | ICD-10-CM

## 2022-06-16 DIAGNOSIS — E785 Hyperlipidemia, unspecified: Secondary | ICD-10-CM

## 2022-06-16 DIAGNOSIS — K219 Gastro-esophageal reflux disease without esophagitis: Secondary | ICD-10-CM

## 2022-06-19 ENCOUNTER — Ambulatory Visit (INDEPENDENT_AMBULATORY_CARE_PROVIDER_SITE_OTHER): Payer: Medicare HMO | Admitting: Family Medicine

## 2022-06-19 ENCOUNTER — Encounter: Payer: Self-pay | Admitting: Family Medicine

## 2022-06-19 VITALS — BP 140/76 | HR 60 | Temp 98.3°F | Ht 73.0 in | Wt 199.8 lb

## 2022-06-19 DIAGNOSIS — Z1211 Encounter for screening for malignant neoplasm of colon: Secondary | ICD-10-CM | POA: Diagnosis not present

## 2022-06-19 DIAGNOSIS — I10 Essential (primary) hypertension: Secondary | ICD-10-CM

## 2022-06-19 DIAGNOSIS — R309 Painful micturition, unspecified: Secondary | ICD-10-CM

## 2022-06-19 DIAGNOSIS — E785 Hyperlipidemia, unspecified: Secondary | ICD-10-CM

## 2022-06-19 DIAGNOSIS — E11649 Type 2 diabetes mellitus with hypoglycemia without coma: Secondary | ICD-10-CM | POA: Diagnosis not present

## 2022-06-19 DIAGNOSIS — K219 Gastro-esophageal reflux disease without esophagitis: Secondary | ICD-10-CM

## 2022-06-19 DIAGNOSIS — E1165 Type 2 diabetes mellitus with hyperglycemia: Secondary | ICD-10-CM

## 2022-06-19 LAB — POCT URINALYSIS DIP (MANUAL ENTRY)
Bilirubin, UA: NEGATIVE
Blood, UA: NEGATIVE
Glucose, UA: NEGATIVE mg/dL
Ketones, POC UA: NEGATIVE mg/dL
Leukocytes, UA: NEGATIVE
Nitrite, UA: NEGATIVE
Protein Ur, POC: NEGATIVE mg/dL
Spec Grav, UA: <=1.005 — AB (ref 1.010–1.025)
Urobilinogen, UA: 0.2 E.U./dL
pH, UA: 6.5 (ref 5.0–8.0)

## 2022-06-19 LAB — HEMOGLOBIN A1C: Hgb A1c MFr Bld: 7.3 % — ABNORMAL HIGH (ref 4.6–6.5)

## 2022-06-19 MED ORDER — PANTOPRAZOLE SODIUM 40 MG PO TBEC: 40.0000 mg | DELAYED_RELEASE_TABLET | Freq: Every day | ORAL | 0 refills | Status: DC

## 2022-06-19 MED ORDER — LISINOPRIL-HYDROCHLOROTHIAZIDE 20-12.5 MG PO TABS: 2.0000 | ORAL_TABLET | Freq: Every day | ORAL | 0 refills | Status: DC

## 2022-06-19 MED ORDER — ATORVASTATIN CALCIUM 10 MG PO TABS: 10.0000 mg | ORAL_TABLET | Freq: Every day | ORAL | 0 refills | Status: DC

## 2022-06-19 MED ORDER — METFORMIN HCL 500 MG PO TABS: 1000.0000 mg | ORAL_TABLET | Freq: Two times a day (BID) | ORAL | 0 refills | Status: DC

## 2022-06-19 MED ORDER — AMLODIPINE BESYLATE 2.5 MG PO TABS: 2.5000 mg | ORAL_TABLET | Freq: Every day | ORAL | 1 refills | Status: DC

## 2022-06-19 MED ORDER — AMLODIPINE BESYLATE 5 MG PO TABS: 5.0000 mg | ORAL_TABLET | Freq: Every day | ORAL | 1 refills | Status: DC

## 2022-06-19 MED ORDER — GLIMEPIRIDE 1 MG PO TABS: 1.0000 mg | ORAL_TABLET | Freq: Every day | ORAL | 1 refills | Status: DC

## 2022-06-19 NOTE — Patient Instructions (Addendum)
Try slightly higher dose of amlodipine for your blood pressure. If any new side effects let me know.  I will check labs, but do not skip dose of metformin. If any low readings, let me know and we can adjust meds.  I will refer you to urology to discuss the episodic burning with urination but urine test in the office looks good today.  Be seen if any acute worsening symptoms. I will also refer you to gastroenterology for colonoscopy. Take care

## 2022-06-19 NOTE — Progress Notes (Signed)
Subjective:  Patient ID: Brendan Gonzales, male    DOB: [birth year]  Age: 66 y.o. MRN: [id number]  CC:  Chief Complaint  Patient presents with   Hypertension   Diabetes   painful urination    Pt states at times its painful to urinate     HPI Brendan Gonzales presents for   Diabetes: With hyperglycemia.  A1c slightly elevated in September.  Metformin 1000 mg twice daily.  Added glimepiride 1 mg daily.  Some meds have been cost prohibitive previously.  New insurance.  Home readings: 110-156. Sometimes skips 2nd dose of metformin if sugars look ok - few times per week.  No symptomatic lows. Lowest 94.  Heartburn stable with protonix.  On statin with lipitor '10mg'$  qd.  Microalbumin: Normal ratio 03/12/2021 Optho, foot exam, pneumovax:  Ophthalmology: will call for appointment.  COVID booster in October at [place].  Lab Results  Component Value Date   HGBA1C 7.6 (H) 03/04/2022   HGBA1C 7.3 (A) 10/03/2021   HGBA1C 7.8 (H) 06/27/2021   Lab Results  Component Value Date   MICROALBUR 1.1 03/12/2021   LDLCALC 60 03/04/2022   CREATININE 0.72 03/04/2022   Dysuria Treated for infective urethritis in September with azithromycin.  Reported palpitations, headache with previous doxycycline treatment. Trace blood on in office U/a on 9/18. No growth on culture 03/04/22.  Better after abx, but still some intermittent burning at end of penis. 2-3 times per day. No penile d/c or bleeding. No new back/or abd pain. No frequency/urgency. No new sexual contacts.   Returned from trip to his country few days ago. Stressful back home.   Hypertension: Decreased control last visit on his lisinopril/HCTZ, max dose.  Added amlodipine 2.5 mg daily.no new side effects.  Home readings: 131/70 at home today. Sometimes 140/90 range.  BP Readings from Last 3 Encounters:  06/19/22 (!) 140/76  03/11/22 (!) 140/70  03/04/22 (!) 140/86   Lab Results  Component Value Date   CREATININE 0.72 03/04/2022       History Patient Active Problem List   Diagnosis Date Noted   Hospice care patient 07/06/2019   Eczema of hand 10/19/2014   Type 2 diabetes mellitus ([place]) 06/13/2013   HTN (hypertension) 06/13/2013   Normal coronary arteries- 2008 06/13/2013   Chest pain with moderate risk of acute coronary syndrome 06/12/2013   Special screening for malignant neoplasms, colon 06/27/2011   Arthritis of knee 06/14/2011   Past Medical History:  Diagnosis Date   Arthritis    Constipation    Diabetes mellitus    type 2   Dizziness    Headache(784.0)    Hyperlipidemia    Hypertension    benign   Past Surgical History:  Procedure Laterality Date   COLONOSCOPY     CORONARY ANGIOPLASTY     HEMORRHOID SURGERY     right femeroral bypass     right knee surgery     Allergies  Allergen Reactions   Metoprolol Diarrhea   Prior to Admission medications   Medication Sig Start Date End Date Taking? Authorizing Provider  acetaminophen (TYLENOL) 500 MG tablet Take 500 mg by mouth 3 (three) times daily as needed for mild pain.    Yes [provider]  aspirin 81 MG tablet Take 81 mg by mouth every morning.   Yes [provider]  amLODipine (NORVASC) 2.5 MG tablet Take 1 tablet (2.5 mg total) by mouth daily. 06/19/22   [name], MD  atorvastatin (LIPITOR) 10 MG  tablet Take 1 tablet (10 mg total) by mouth daily. 06/19/22   [name], MD  glimepiride (AMARYL) 1 MG tablet Take 1 tablet (1 mg total) by mouth daily with breakfast. 06/19/22   [name], MD  lisinopril-hydrochlorothiazide (ZESTORETIC) 20-12.5 MG tablet Take 2 tablets by mouth daily. 06/19/22   [name], MD  metFORMIN (GLUCOPHAGE) 500 MG tablet Take 2 tablets (1,000 mg total) by mouth 2 (two) times daily. 06/19/22   [name], MD  pantoprazole (PROTONIX) 40 MG tablet Take 1 tablet (40 mg total) by mouth daily. 06/19/22   [name], MD   Social History   Socioeconomic  History   Marital status: Married    Spouse name: Not on file   Number of children: Not on file   Years of education: Not on file   Highest education level: Not on file  Occupational History   Not on file  Tobacco Use   Smoking status: Former   Smokeless tobacco: Never  Substance and Sexual Activity   Alcohol use: No   Drug use: No   Sexual activity: Never  Other Topics Concern   Not on file  Social History Narrative   Not on file   Social Determinants of Health   Financial Resource Strain: Not on file  Food Insecurity: Not on file  Transportation Needs: Not on file  Physical Activity: Not on file  Stress: Not on file  Social Connections: Not on file  Intimate Partner Violence: Not on file    Review of Systems  Constitutional:  Negative for fatigue and unexpected weight change.  Eyes:  Negative for visual disturbance.  Respiratory:  Negative for cough, chest tightness and shortness of breath.   Cardiovascular:  Negative for chest pain, palpitations and leg swelling.  Gastrointestinal:  Negative for abdominal pain and blood in stool.  Neurological:  Positive for headaches (episodic, not persistent or new.). Negative for dizziness and light-headedness.     Objective:   Vitals:   06/19/22 0915 06/19/22 0933  BP: (!) 140/90 (!) 140/76  Pulse: 60   Temp: 98.3 F (36.8 C)   SpO2: 100%   Weight: 199 lb 12.8 oz (90.6 kg)   Height: '6\' 1"'$  (1.854 m)      Physical Exam Vitals reviewed.  Constitutional:      Appearance: He is well-developed.  HENT:     Head: Normocephalic and atraumatic.  Neck:     Vascular: No carotid bruit or JVD.  Cardiovascular:     Rate and Rhythm: Normal rate and regular rhythm.     Heart sounds: Normal heart sounds. No murmur heard. Pulmonary:     Effort: Pulmonary effort is normal.     Breath sounds: Normal breath sounds. No rales.  Musculoskeletal:     Right lower leg: No edema.     Left lower leg: No edema.  Skin:    General: Skin  is warm and dry.  Neurological:     Mental Status: He is alert and oriented to person, place, and time.  Psychiatric:        Mood and Affect: Mood normal.      Results for orders placed or performed in visit on 06/19/22  POCT urinalysis dipstick  Result Value Ref Range   Color, UA yellow yellow   Clarity, UA clear clear   Glucose, UA negative negative mg/dL   Bilirubin, UA negative negative   Ketones, POC UA negative negative mg/dL   Spec Grav,  UA <=1.005 (A) 1.010 - 1.025   Blood, UA negative negative   pH, UA 6.5 5.0 - 8.0   Protein Ur, POC negative negative mg/dL   Urobilinogen, UA 0.2 0.2 or 1.0 E.U./dL   Nitrite, UA Negative Negative   Leukocytes, UA Negative Negative    Assessment & Plan:  Jeydan Barner is a 66 y.o. male . Painful urination - Plan: POCT urinalysis dipstick  -Status post treatment in September.  Prior urine culture negative.  Overall improved with episodic fleeting dysuria during the day.  Urinalysis reassuring.  Hold on further antibiotics at this time, referred to urology.  Essential hypertension - Plan: amLODipine (NORVASC) 5 MG tablet, lisinopril-hydrochlorothiazide (ZESTORETIC) 20-12.5 MG tablet  -Still decreased control.  Increase amlodipine to 5 mg daily.  Potential side effects discussed, RTC precautions given  Hyperlipidemia, unspecified hyperlipidemia type - Plan: atorvastatin (LIPITOR) 10 MG tablet Tolerating Lipitor continue same dose fasting labs next visit  Type 2 diabetes mellitus with hyperglycemia, without long-term current use of insulin (HCC) - Plan: glimepiride (AMARYL) 1 MG tablet Type 2 diabetes mellitus with hypoglycemia without coma, without long-term current use of insulin ([place]) - Plan: metFORMIN (GLUCOPHAGE) 500 MG tablet  -Check A1c, continue same regimen with glimepiride for now, consistent dosing of metformin including evening dosing recommended with hypoglycemia precautions, RTC precautions.  Hold on SGLT2 change for now given  dysuria symptoms as above, and unsure of coverage.  Urology eval first.  Gastroesophageal reflux disease, unspecified whether esophagitis present - Plan: pantoprazole (PROTONIX) 40 MG tablet Stable on Protonix.  Referred to gastroenterology for updated colonoscopy.   Meds ordered this encounter  Medications   DISCONTD: amLODipine (NORVASC) 2.5 MG tablet    Sig: Take 1 tablet (2.5 mg total) by mouth daily.    Dispense:  90 tablet    Refill:  1   atorvastatin (LIPITOR) 10 MG tablet    Sig: Take 1 tablet (10 mg total) by mouth daily.    Dispense:  90 tablet    Refill:  0   glimepiride (AMARYL) 1 MG tablet    Sig: Take 1 tablet (1 mg total) by mouth daily with breakfast.    Dispense:  90 tablet    Refill:  1   lisinopril-hydrochlorothiazide (ZESTORETIC) 20-12.5 MG tablet    Sig: Take 2 tablets by mouth daily.    Dispense:  180 tablet    Refill:  0   metFORMIN (GLUCOPHAGE) 500 MG tablet    Sig: Take 2 tablets (1,000 mg total) by mouth 2 (two) times daily.    Dispense:  360 tablet    Refill:  0   pantoprazole (PROTONIX) 40 MG tablet    Sig: Take 1 tablet (40 mg total) by mouth daily.    Dispense:  90 tablet    Refill:  0   amLODipine (NORVASC) 5 MG tablet    Sig: Take 1 tablet (5 mg total) by mouth daily.    Dispense:  90 tablet    Refill:  1    New dose   Patient Instructions  Try slightly higher dose of amlodipine for your blood pressure. If any new side effects let me know.  I will check labs, but do not skip dose of metformin. If any low readings, let me know and we can adjust meds.  I will refer you to urology to discuss the episodic burning with urination but urine test in the office looks good today.  Be seen if any acute worsening symptoms. I will  also refer you to gastroenterology for colonoscopy. Take care       Signed,   [name], MD [place] 06/19/22 9:41 AM

## 2022-07-04 DIAGNOSIS — H524 Presbyopia: Secondary | ICD-10-CM | POA: Diagnosis not present

## 2022-07-04 DIAGNOSIS — E119 Type 2 diabetes mellitus without complications: Secondary | ICD-10-CM | POA: Diagnosis not present

## 2022-07-04 LAB — HM DIABETES EYE EXAM

## 2022-07-12 ENCOUNTER — Encounter: Payer: Self-pay | Admitting: Family Medicine

## 2022-08-08 ENCOUNTER — Encounter (HOSPITAL_COMMUNITY): Payer: Self-pay

## 2022-08-08 ENCOUNTER — Ambulatory Visit (HOSPITAL_COMMUNITY)
Admission: EM | Admit: 2022-08-08 | Discharge: 2022-08-08 | Disposition: A | Discharge status: Home / Self Care | Payer: Medicare HMO | Attending: Emergency Medicine | Admitting: Emergency Medicine

## 2022-08-08 DIAGNOSIS — M542 Cervicalgia: Secondary | ICD-10-CM

## 2022-08-08 DIAGNOSIS — K115 Sialolithiasis: Secondary | ICD-10-CM | POA: Diagnosis not present

## 2022-08-08 MED ORDER — IBUPROFEN 600 MG PO TABS: 600.0000 mg | ORAL_TABLET | Freq: Four times a day (QID) | ORAL | 0 refills | Status: DC | PRN

## 2022-08-08 NOTE — ED Provider Notes (Signed)
HPI  SUBJECTIVE:  Brendan Gonzales is a 67 y.o. male who presents with bilateral painful masses at the angle of his jaws starting 10 days ago.  He states the pain goes down his anterior neck, but also radiates posteriorly.  He reports a headache.  He describes the pain as constant soreness.  These masses have not changed in size since the symptoms started.  No fevers, sore throat, swelling underneath the tongue, facial swelling.  He reports pain with neck rotation.  No voice changes, overlying erythema in the area of pain.  He reports a dry mouth in the morning.  He has tried Tylenol with improvement in his symptoms.  Symptoms are worse when he lifts heavy objects, neck flexion and rotation.  He drinks about a liter and a half of water a day.  He has a past medical history of diabetes, hypertension, hypercholesterolemia.  No history of carotid artery disease, coronary artery disease.  He has never smoked.  He states that this is a recurrent issue and has been going on for years.  He was once referred to a specialist, but was lost to follow-up.  PCP: In [place].    Past Medical History:  Diagnosis Date   Arthritis    Constipation    Diabetes mellitus    type 2   Dizziness    Headache(784.0)    Hyperlipidemia    Hypertension    benign    Past Surgical History:  Procedure Laterality Date   COLONOSCOPY     CORONARY ANGIOPLASTY     HEMORRHOID SURGERY     right femeroral bypass     right knee surgery      Family History  Problem Relation Age of Onset   Diabetes Other    Hypertension Other    Anesthesia problems Neg Hx    Hypotension Neg Hx    Malignant hyperthermia Neg Hx    Pseudochol deficiency Neg Hx     Social History   Tobacco Use   Smoking status: Former   Smokeless tobacco: Never  Substance Use Topics   Alcohol use: No   Drug use: No    No current facility-administered medications for this encounter.  Current Outpatient Medications:    ibuprofen (ADVIL) 600 MG  tablet, Take 1 tablet (600 mg total) by mouth every 6 (six) hours as needed., Disp: 30 tablet, Rfl: 0   acetaminophen (TYLENOL) 500 MG tablet, Take 500 mg by mouth 3 (three) times daily as needed for mild pain. , Disp: , Rfl:    amLODipine (NORVASC) 5 MG tablet, Take 1 tablet (5 mg total) by mouth daily., Disp: 90 tablet, Rfl: 1   aspirin 81 MG tablet, Take 81 mg by mouth every morning., Disp: , Rfl:    atorvastatin (LIPITOR) 10 MG tablet, Take 1 tablet (10 mg total) by mouth daily., Disp: 90 tablet, Rfl: 0   glimepiride (AMARYL) 1 MG tablet, Take 1 tablet (1 mg total) by mouth daily with breakfast., Disp: 90 tablet, Rfl: 1   lisinopril-hydrochlorothiazide (ZESTORETIC) 20-12.5 MG tablet, Take 2 tablets by mouth daily., Disp: 180 tablet, Rfl: 0   metFORMIN (GLUCOPHAGE) 500 MG tablet, Take 2 tablets (1,000 mg total) by mouth 2 (two) times daily., Disp: 360 tablet, Rfl: 0   pantoprazole (PROTONIX) 40 MG tablet, Take 1 tablet (40 mg total) by mouth daily., Disp: 90 tablet, Rfl: 0  Allergies  Allergen Reactions   Metoprolol Diarrhea     ROS  As noted in HPI.  Physical Exam  BP (!) 164/82 (BP Location: Left Arm)   Pulse (!) 59   Temp 98.4 F (36.9 C) (Oral)   Resp 16   SpO2 97%   Constitutional: Well developed, well nourished, no acute distress Eyes:  EOMI, conjunctiva normal bilaterally HENT: Normocephalic, atraumatic,mucus membranes moist.  Palpable, positive tenderness along bilateral submandibular glands.  No overlying erythema.  No swelling, tenderness over the parotid glands bilaterally.  No swelling under the tongue.  No expressible purulent drainage from the salivary duct.  No facial swelling.  Positive tenderness along the larynx, sternocleidomastoid bilaterally. Neck: Positive bilateral trapezius tenderness.  No anterior, posterior cervical lymphadenopathy  Respiratory: Normal inspiratory effort Cardiovascular: Normal rate.  Negative carotid bruit. GI: nondistended skin: No  rash, skin intact Musculoskeletal: no deformities Neurologic: Alert & oriented x 3, no focal neuro deficits Psychiatric: Speech and behavior appropriate   ED Course   Medications - No data to display  No orders of the defined types were placed in this encounter.   No results found for this or any previous visit (from the past 24 hour(s)). No results found.  ED Clinical Impression  1. Neck pain   2. Sialolithiasis of submandibular gland      ED Assessment/Plan     Suspect bilateral silaliothiasis as this is a recurrent issue.  There does not appear to be any infection at this time.  Malignancy in the differential, but feel that this is less likely as this is recurrent.  He also has has muscular tenderness over his neck.  Doubt carotid artery disease, airway impingement.  Will send home with ibuprofen/Tylenol, increase fluid intake to 2 L a day, advised patient to try sour candies.  Follow-up with [facility] if symptoms persist.  Discussed  MDM, treatment plan, and plan for follow-up with patient.  patient agrees with plan.   Meds ordered this encounter  Medications   ibuprofen (ADVIL) 600 MG tablet    Sig: Take 1 tablet (600 mg total) by mouth every 6 (six) hours as needed.    Dispense:  30 tablet    Refill:  0      *This clinic note was created using [employer]. Therefore, there may be occasional mistakes despite careful proofreading.  ?    Melynda Ripple, MD 08/09/22 [phone]

## 2022-08-08 NOTE — Discharge Instructions (Signed)
I suspect this could be due to a stone in the salivary duct.  Increase your fluid intake to 64 ounces or 2 L of water a day.  You can try sour candies such as sour patch kids and lemon drops to help express any possible stone.  There does not appear to be an infection at this time.  You can try ibuprofen combined with Tylenol 3 times a day as needed for pain.  Please follow-up with ENT if not any better in a week.

## 2022-08-08 NOTE — ED Triage Notes (Signed)
Pt states he has a boil on the left side of his neck that comes and goes states it causes him pain that radiates to the back of his head and neck. States he is taking Tylenol at home for the pain with some relief.

## 2022-09-05 ENCOUNTER — Encounter: Payer: Self-pay | Admitting: Family Medicine

## 2022-09-05 ENCOUNTER — Ambulatory Visit (INDEPENDENT_AMBULATORY_CARE_PROVIDER_SITE_OTHER): Payer: Medicare HMO | Admitting: Family Medicine

## 2022-09-05 VITALS — BP 122/66 | HR 61 | Temp 97.8°F | Ht 73.0 in | Wt 202.4 lb

## 2022-09-05 DIAGNOSIS — R6884 Jaw pain: Secondary | ICD-10-CM

## 2022-09-05 DIAGNOSIS — E785 Hyperlipidemia, unspecified: Secondary | ICD-10-CM

## 2022-09-05 DIAGNOSIS — E042 Nontoxic multinodular goiter: Secondary | ICD-10-CM | POA: Diagnosis not present

## 2022-09-05 DIAGNOSIS — E11649 Type 2 diabetes mellitus with hypoglycemia without coma: Secondary | ICD-10-CM

## 2022-09-05 DIAGNOSIS — I10 Essential (primary) hypertension: Secondary | ICD-10-CM | POA: Diagnosis not present

## 2022-09-05 DIAGNOSIS — M542 Cervicalgia: Secondary | ICD-10-CM

## 2022-09-05 MED ORDER — LISINOPRIL-HYDROCHLOROTHIAZIDE 20-12.5 MG PO TABS: 2.0000 | ORAL_TABLET | Freq: Every day | ORAL | 1 refills | Status: DC

## 2022-09-05 MED ORDER — METFORMIN HCL 500 MG PO TABS: 1000.0000 mg | ORAL_TABLET | Freq: Two times a day (BID) | ORAL | 1 refills | Status: DC

## 2022-09-05 MED ORDER — ATORVASTATIN CALCIUM 10 MG PO TABS: 10.0000 mg | ORAL_TABLET | Freq: Every day | ORAL | 1 refills | Status: DC

## 2022-09-05 NOTE — Assessment & Plan Note (Signed)
Tolerating current regimen, blood pressure stable in office.  Will continue same dose with medication adjustments depending on labs, but will defer until next visit as not quite due. Episodic dizziness may be related to relative hypoglycemia.  Regular meals discussed, maintain hydration, and if symptoms return, follow-up to discuss other potential causes.  2-week follow-up planned.

## 2022-09-05 NOTE — Patient Instructions (Addendum)
Try eating more frequent meals - do not wait more than 4-5 hours between meal or a healthy snack. If any low blood sugars, or dizziness returns - follow up to look other causes.   Recheck in 2 weeks and will be able to perform labs at that time including your 68-monthblood sugar.  No medication changes for now.  Blood pressure looks better today.   I will order an ultrasound of your thyroid with the previous nodules and the increased pain as well as x-ray of your jaw and neck.  Likely will refer you to ear nose and throat specialist but would like to see these results initially.  [facility] will likely have the ultrasound of the thyroid performed at that location.  Can have the x-ray of your neck and jaw at the same time.  Okay to use Tylenol over-the-counter up to every 6 hours, ibuprofen on occasion but try to avoid using that more than once or twice per day.  Gentle stretches for the back of the neck and possible muscle spasms and warm compresses like a hot towel or warm bath/shower may help those muscles as well.  If any acute worsening or difficulty swallowing proceed to the emergency room.  Return to the clinic or go to the nearest emergency room if any of your symptoms worsen or new symptoms occur.

## 2022-09-05 NOTE — Progress Notes (Signed)
Subjective:  Patient ID: Brendan Gonzales, male    DOB: 1956-01-31  Age: 67 y.o. MRN: [id number]  CC:  Chief Complaint  Patient presents with   Diabetes    Pt doing well,    Hypertension   Hyperlipidemia    HPI [name] presents for   Diabetes: With hyperglycemia.  Metformin was continued 1000 mg twice per day, glimepiride 1 mg added in September.  Some other meds have been cost prohibitive. Taking metformin and amaryl consistently.  Amaryl in the morning.  Home readings:  some difficulty with obtaining this history.  Random - 27 yesterday. Had not eaten since 10am  slight dizzy feeling - took juice - then 200 at 11:30pm. Usually 140-160.  but breaking fast as has to drink water for sialolithiasis diagnosed last month.   Every few months has a lower reading.  Eating at 10am, then 7pm.  Microalbumin: 02/2021. Normal.  Optho, foot exam, pneumovax: up to date.   Lab Results  Component Value Date   HGBA1C 7.3 (H) 06/19/2022   HGBA1C 7.6 (H) 03/04/2022   HGBA1C 7.3 (A) 10/03/2021   Lab Results  Component Value Date   MICROALBUR 1.1 03/12/2021   LDLCALC 60 03/04/2022   CREATININE 0.72 03/04/2022   Hypertension: Norvasc '5mg'$  qd., lisinopril hct 40/'25mg'$  total dose per day.  Occasional dizziness. Spaced out meals as above. No syncope. No chest pain or dyspnea.  Home readings: 134/68 BP Readings from Last 3 Encounters:  09/05/22 122/66  08/08/22 (!) 164/82  06/19/22 (!) 140/76   Lab Results  Component Value Date   CREATININE 0.72 03/04/2022   Hyperlipidemia: Lipitor nightly - no new side effects or myalgias.  Lab Results  Component Value Date   CHOL 115 03/04/2022   HDL 44.50 03/04/2022   LDLCALC 60 03/04/2022   TRIG 54.0 03/04/2022   CHOLHDL 3 03/04/2022   Lab Results  Component Value Date   ALT 12 03/04/2022   AST 15 03/04/2022   ALKPHOS 104 03/04/2022   BILITOT 0.5 03/04/2022   Neck Pain: Urgent care note reviewed from February 15.  Suspected bilateral  sialolithiasis, no apparent infection at that time.  Also had some muscular tenderness over his neck.  Treated with ibuprofen, Tylenol, increase fluid intake, sour candies and plan for ENT follow-up if persistent. Trying sour candies and more fluid. Not helping.  Taking tylenol 4 times per day, ibuprofen every few days. Pain improves then returns after 6 hours.  Pain in front of neck, to back of neck and to ears. No change in hearing.  More sore after heavy lifting. Hurts to swallow at times, not persistent.  Dry mouth. No fever.  Concerned as uncle had throat cancer. Has not seen ENT.  Thyroid nodules noted on CT scan in 2018, ultrasound performed 05/12/2017.  2 nodules appreciated.  1 small enough to repeat ultrasound in a year, other nodule 3 cm on the left mid gland.  Fine-needle aspiration recommendation sent to patient.  Order placed for FNA on 05/21/2017. This was not performed.     History Patient Active Problem List   Diagnosis Date Noted   Hyperlipidemia 09/05/2022   Hospice care patient 07/06/2019   Eczema of hand 10/19/2014   Type 2 diabetes mellitus ([place]) 06/13/2013   Essential hypertension 06/13/2013   Normal coronary arteries- 2008 06/13/2013   Chest pain with moderate risk of acute coronary syndrome 06/12/2013   Special screening for malignant neoplasms, colon 06/27/2011   Arthritis of knee 06/14/2011  Past Medical History:  Diagnosis Date   Arthritis    Constipation    Diabetes mellitus    type 2   Dizziness    Headache(784.0)    Hyperlipidemia    Hypertension    benign      Review of Systems Per HPI.   Objective:   Vitals:   09/05/22 1038  BP: 122/66  Pulse: 61  Temp: 97.8 F (36.6 C)  TempSrc: Temporal  SpO2: 94%  Weight: 202 lb 6.4 oz (91.8 kg)  Height: '6\' 1"'$  (1.854 m)     Physical Exam Vitals reviewed.  Constitutional:      Appearance: He is well-developed.  HENT:     Head: Normocephalic and atraumatic.     Right Ear: Ear canal  normal.     Left Ear: Tympanic membrane and ear canal normal.     Ears:     Comments: Some cerumen within right canal but visualized portion of TM appeared normal.    Mouth/Throat:     Mouth: Mucous membranes are moist.     Pharynx: Oropharynx is clear. No posterior oropharyngeal erythema.  Neck:     Vascular: No carotid bruit or JVD.     Comments: No stridor, no wheeze.  Clearing secretions normally and speaking normally. Complains of diffuse discomfort across the anterior and lateral neck, firm area below the left mid jawline near submandibular gland, tender to palpation.  No appreciable swelling or asymmetry to right side.  Diffuse mandibular pain from TMJ through angle and mid jaw.  No apparent defect or swelling. Cardiovascular:     Rate and Rhythm: Normal rate and regular rhythm.     Heart sounds: Normal heart sounds. No murmur heard. Pulmonary:     Effort: Pulmonary effort is normal.     Breath sounds: Normal breath sounds. No stridor. No wheezing or rales.  Musculoskeletal:     Right lower leg: No edema.     Left lower leg: No edema.     Comments: C-spine, slight decreased range of motion, minimal discomfort into the paraspinals with terminal range of motion.  No midline bony tenderness.  Slight spasm, tenderness of paraspinals bilaterally that does reproduce some of his posterior neck pain.  Skin:    General: Skin is warm and dry.  Neurological:     Mental Status: He is alert and oriented to person, place, and time.  Psychiatric:        Mood and Affect: Mood normal.   56 minutes spent during visit, including chart review, counseling and assimilation of information, review of prior records, clarification of symptoms and history, exam, discussion of plan, and chart completion.    Assessment & Plan:  Bruin Paragas is a 67 y.o. male . Type 2 diabetes mellitus with hypoglycemia without coma, without long-term current use of insulin (HCC) Assessment & Plan: As above, regular  meals discussed to lessen chance of hypoglycemia causing episodic dizziness.  RTC precautions if persistent.  No change in meds at this time but if persistent hypoglycemia in spite of regular meals, will need to stop sulfonylurea and look at other treatment options.  Orders: -     metFORMIN HCl; Take 2 tablets (1,000 mg total) by mouth 2 (two) times daily.  Dispense: 360 tablet; Refill: 1  Essential hypertension Assessment & Plan: Tolerating current regimen, blood pressure stable in office.  Will continue same dose with medication adjustments depending on labs, but will defer until next visit as not quite due. Episodic dizziness may  be related to relative hypoglycemia.  Regular meals discussed, maintain hydration, and if symptoms return, follow-up to discuss other potential causes.  2-week follow-up planned.  Orders: -     Lisinopril-hydroCHLOROthiazide; Take 2 tablets by mouth daily.  Dispense: 180 tablet; Refill: 1  Hyperlipidemia, unspecified hyperlipidemia type Assessment & Plan: Tolerating current regimen, plan on stated labs in 2 weeks.  Continue same dose statin for now.  Orders: -     Atorvastatin Calcium; Take 1 tablet (10 mg total) by mouth daily.  Dispense: 90 tablet; Refill: 1  Multiple thyroid nodules -     US THYROID; Future Neck pain -     DG Cervical Spine Complete; Future -     US THYROID; Future Jaw pain -     DG Mandible 4 Views; Future  History of thyroid nodules, previously ordered aspiration, but not sure why that was not performed.  Plan on repeat imaging with thyroid ultrasound, and then likely fine-needle aspiration versus surgical eval depending on results.    Diffuse bilateral jaw pain, extends beyond TMJ, unlikely TMJ syndrome.  Will start with mandibular imaging, then can decide on CT versus ENT eval depending on thyroid ultrasound results.  RTC/ER precautions given.  Tylenol with episodic NSAID if needed for pain relief for now.  Posterior neck pain  likely spasm, cervical.  Possible underlying degenerative disease.  Check C-spine x-ray, symptomatic care discussed with warm compresses, gentle range of motion, stretches.  Recheck 2 weeks.  Meds as above.   Patient Instructions  Try eating more frequent meals - do not wait more than 4-5 hours between meal or a healthy snack. If any low blood sugars, or dizziness returns - follow up to look other causes.   Recheck in 2 weeks and will be able to perform labs at that time including your 53-monthblood sugar.  No medication changes for now.  Blood pressure looks better today.   I will order an ultrasound of your thyroid with the previous nodules and the increased pain as well as x-ray of your jaw and neck.  Likely will refer you to ear nose and throat specialist but would like to see these results initially.  [facility] will likely have the ultrasound of the thyroid performed at that location.  Can have the x-ray of your neck and jaw at the same time.  Okay to use Tylenol over-the-counter up to every 6 hours, ibuprofen on occasion but try to avoid using that more than once or twice per day.  Gentle stretches for the back of the neck and possible muscle spasms and warm compresses like a hot towel or warm bath/shower may help those muscles as well.  If any acute worsening or difficulty swallowing proceed to the emergency room.  Return to the clinic or go to the nearest emergency room if any of your symptoms worsen or new symptoms occur.     Signed,   JMerri Ray MD [facility] 09/05/22 6:25 PM

## 2022-09-05 NOTE — Assessment & Plan Note (Signed)
As above, regular meals discussed to lessen chance of hypoglycemia causing episodic dizziness.  RTC precautions if persistent.  No change in meds at this time but if persistent hypoglycemia in spite of regular meals, will need to stop sulfonylurea and look at other treatment options.

## 2022-09-05 NOTE — Assessment & Plan Note (Signed)
Tolerating current regimen, plan on stated labs in 2 weeks.  Continue same dose statin for now.

## 2022-09-10 ENCOUNTER — Other Ambulatory Visit: Payer: Self-pay

## 2022-09-10 DIAGNOSIS — I10 Essential (primary) hypertension: Secondary | ICD-10-CM | POA: Diagnosis not present

## 2022-09-10 DIAGNOSIS — Z87891 Personal history of nicotine dependence: Secondary | ICD-10-CM | POA: Insufficient documentation

## 2022-09-10 DIAGNOSIS — E119 Type 2 diabetes mellitus without complications: Secondary | ICD-10-CM | POA: Diagnosis not present

## 2022-09-10 DIAGNOSIS — M542 Cervicalgia: Secondary | ICD-10-CM | POA: Diagnosis not present

## 2022-09-10 DIAGNOSIS — R519 Headache, unspecified: Secondary | ICD-10-CM | POA: Insufficient documentation

## 2022-09-10 NOTE — Addendum Note (Signed)
Addended by: [name] R on: 09/10/2022 09:36 AM   Modules accepted: Orders

## 2022-09-10 NOTE — ED Triage Notes (Signed)
Reports neck pain x4 weeks. Was seen by PCP and told he has multiple thyroid nodules and to follow-up for [place], but can't get in until 4/15. Reports taking tylenol and ibuprofen at home without relief.

## 2022-09-10 NOTE — Telephone Encounter (Signed)
You cannot help this unless it was changed to a STAT image correct?

## 2022-09-10 NOTE — Telephone Encounter (Signed)
Called [facility] Is where the pt is scheduled. They did offer him next week in [place] but he declined. I called to provide pt with that information. Pt states that he is having the pain in his neck and also a headache. He states that he was just going to go the the emergence room.

## 2022-09-10 NOTE — Telephone Encounter (Signed)
Is there any reason this should be a STAT order?

## 2022-09-10 NOTE — Telephone Encounter (Signed)
ED continue pain I tried to reorder as a stat order but I was not given an option to change that to a stat order.  I have put a request to referrals under scheduling to have that performed stat, but will forward this message to [address] as well.  Thanks

## 2022-09-10 NOTE — Telephone Encounter (Signed)
Noted.  My previous message should have read " As continued pain I tried to reorder as a stat order".  I was referring to thyroid ultrasound.  If he is having new headache or worsening neck pain it is best that he is seen acutely, either through urgent care or ER today is reasonable.  Depending on their evaluation can have imaging through ER if needed.  Thanks.

## 2022-09-11 ENCOUNTER — Emergency Department (HOSPITAL_BASED_OUTPATIENT_CLINIC_OR_DEPARTMENT_OTHER)
Admission: EM | Admit: 2022-09-11 | Discharge: 2022-09-11 | Disposition: A | Discharge status: Home / Self Care | Payer: Medicare HMO | Attending: Emergency Medicine | Admitting: Emergency Medicine

## 2022-09-11 ENCOUNTER — Emergency Department (HOSPITAL_BASED_OUTPATIENT_CLINIC_OR_DEPARTMENT_OTHER): Payer: Medicare HMO

## 2022-09-11 DIAGNOSIS — M542 Cervicalgia: Secondary | ICD-10-CM

## 2022-09-11 DIAGNOSIS — M5030 Other cervical disc degeneration, unspecified cervical region: Secondary | ICD-10-CM | POA: Diagnosis not present

## 2022-09-11 DIAGNOSIS — R519 Headache, unspecified: Secondary | ICD-10-CM | POA: Diagnosis not present

## 2022-09-11 DIAGNOSIS — R221 Localized swelling, mass and lump, neck: Secondary | ICD-10-CM | POA: Diagnosis not present

## 2022-09-11 LAB — CBC
HCT: 34.1 % — ABNORMAL LOW (ref 39.0–52.0)
Hemoglobin: 11.1 g/dL — ABNORMAL LOW (ref 13.0–17.0)
MCH: 25.4 pg — ABNORMAL LOW (ref 26.0–34.0)
MCHC: 32.6 g/dL (ref 30.0–36.0)
MCV: 78 fL — ABNORMAL LOW (ref 80.0–100.0)
Platelets: 258 10*3/uL (ref 150–400)
RBC: 4.37 MIL/uL (ref 4.22–5.81)
RDW: 15.4 % (ref 11.5–15.5)
WBC: 6.1 10*3/uL (ref 4.0–10.5)
nRBC: 0 % (ref 0.0–0.2)

## 2022-09-11 LAB — BASIC METABOLIC PANEL
Anion gap: 6 (ref 5–15)
BUN: 11 mg/dL (ref 8–23)
CO2: 31 mmol/L (ref 22–32)
Calcium: 9.7 mg/dL (ref 8.9–10.3)
Chloride: 99 mmol/L (ref 98–111)
Creatinine, Ser: 0.76 mg/dL (ref 0.61–1.24)
GFR, Estimated: >60 mL/min (ref >60)
Glucose, Bld: 159 mg/dL — ABNORMAL HIGH (ref 70–99)
Potassium: 3.7 mmol/L (ref 3.5–5.1)
Sodium: 136 mmol/L (ref 135–145)

## 2022-09-11 MED ORDER — MORPHINE SULFATE (PF) 4 MG/ML IV SOLN
4.0000 mg | Freq: Once | INTRAVENOUS | Status: AC
  Administered 2022-09-11: 4 mg via INTRAVENOUS
  Filled 2022-09-11: qty 1

## 2022-09-11 MED ORDER — OXYCODONE HCL 5 MG PO TABS: 5.0000 mg | ORAL_TABLET | ORAL | 0 refills | Status: DC | PRN

## 2022-09-11 MED ORDER — IOHEXOL 300 MG/ML  SOLN
100.0000 mL | Freq: Once | INTRAMUSCULAR | Status: AC | PRN
  Administered 2022-09-11: 75 mL via INTRAVENOUS

## 2022-09-11 NOTE — Telephone Encounter (Signed)
Pt is currently in the ER. 

## 2022-09-11 NOTE — ED Provider Notes (Signed)
[facility] Emergency Department Provider Note MRN:  [id number]  Arrival date & time: 09/11/22     Chief Complaint   Neck Pain   History of Present Illness   Brendan Gonzales is a 67 y.o. year-old male with a history of hypertension, diabetes presenting to the ED with chief complaint of neck pain.  Worsening neck pain over the past 4 weeks.  PCP concerned about thyroid nodules and has ultrasound scheduled.  No follow-up appointment until mid April.  Pain getting worse and worse, not responding to Tylenol or Motrin.  Feels swelling in the anterior neck, pain in the back of the neck, also with headaches recently.  Review of Systems  A thorough review of systems was obtained and all systems are negative except as noted in the HPI and PMH.   Patient's Health History    Past Medical History:  Diagnosis Date   Arthritis    Constipation    Diabetes mellitus    type 2   Dizziness    Headache(784.0)    Hyperlipidemia    Hypertension    benign    Past Surgical History:  Procedure Laterality Date   COLONOSCOPY     CORONARY ANGIOPLASTY     HEMORRHOID SURGERY     right femeroral bypass     right knee surgery      Family History  Problem Relation Age of Onset   Diabetes Other    Hypertension Other    Anesthesia problems Neg Hx    Hypotension Neg Hx    Malignant hyperthermia Neg Hx    Pseudochol deficiency Neg Hx     Social History   Socioeconomic History   Marital status: Married    Spouse name: Not on file   Number of children: Not on file   Years of education: Not on file   Highest education level: Not on file  Occupational History   Not on file  Tobacco Use   Smoking status: Former   Smokeless tobacco: Never  Substance and Sexual Activity   Alcohol use: No   Drug use: No   Sexual activity: Never  Other Topics Concern   Not on file  Social History Narrative   Not on file   Social Determinants of Health   Financial Resource Strain: Not  on file  Food Insecurity: Not on file  Transportation Needs: Not on file  Physical Activity: Not on file  Stress: Not on file  Social Connections: Not on file  Intimate Partner Violence: Not on file     Physical Exam   Vitals:   09/11/22 0220 09/11/22 0300  BP: (!) 155/89 (!) 149/89  Pulse: 74 66  Resp: 18 16  Temp:  98.2 F (36.8 C)  SpO2: 98% 96%    CONSTITUTIONAL: Well-appearing, appears uncomfortable due to pain NEURO/PSYCH:  Alert and oriented x 3, no focal deficits EYES:  eyes equal and reactive ENT/NECK:  no LAD, no JVD CARDIO: Regular rate, well-perfused, normal S1 and S2 PULM:  CTAB no wheezing or rhonchi GI/GU:  non-distended, non-tender MSK/SPINE:  No gross deformities, no edema SKIN:  no rash, atraumatic   *Additional and/or pertinent findings included in MDM below  Diagnostic and Interventional Summary    EKG Interpretation  Date/Time:    Ventricular Rate:    PR Interval:    QRS Duration:   QT Interval:    QTC Calculation:   R Axis:     Text Interpretation:  Labs Reviewed  CBC - Abnormal; Notable for the following components:      Result Value   Hemoglobin 11.1 (*)    HCT 34.1 (*)    MCV 78.0 (*)    MCH 25.4 (*)    All other components within normal limits  BASIC METABOLIC PANEL - Abnormal; Notable for the following components:   Glucose, Bld 159 (*)    All other components within normal limits    CT Soft Tissue Neck W Contrast  Final Result    CT HEAD WO CONTRAST (5MM)  Final Result    CT C-SPINE NO CHARGE  Final Result      Medications  morphine (PF) 4 MG/ML injection 4 mg (4 mg Intravenous Given 09/11/22 0223)  iohexol (OMNIPAQUE) 300 MG/ML solution 100 mL (75 mLs Intravenous Contrast Given 09/11/22 0311)     Procedures  /  Critical Care Procedures  ED Course and Medical Decision Making  Initial Impression and Ddx Patient has fullness and possible mass in the anterior neck, question thyroid nodules versus underlying  lymphoma or malignancy.  No recent CT imaging that I can find in our records.  Will obtain CT this evening to evaluate for more sinister cause of patient's continued pain.  Past medical/surgical history that increases complexity of ED encounter: Thyroid nodule  Interpretation of Diagnostics I personally reviewed the laboratory assessment and my interpretation is as follows: No significant blood count or electrolyte disturbance  CT revealing largely unchanged thyroid nodule, no other abnormalities.  Patient Reassessment and Ultimate Disposition/Management     Appropriate for discharge as there is no emergent process, will follow-up with his primary care doctor.  Patient management required discussion with the following services or consulting groups:  None  Complexity of Problems Addressed Acute illness or injury that poses threat of life of bodily function  Additional Data Reviewed and Analyzed Further history obtained from: Further history from spouse/family member  Additional Factors Impacting ED Encounter Risk Prescriptions  Barth Kirks. Sedonia Small, MD [facility] [email or web address]  Final Clinical Impressions(s) / ED Diagnoses     ICD-10-CM   1. Neck pain  M54.2       ED Discharge Orders          Ordered    oxyCODONE (ROXICODONE) 5 MG immediate release tablet  Every 4 hours PRN        09/11/22 0418             Discharge Instructions Discussed with and Provided to Patient:     Discharge Instructions      You were evaluated in the Emergency Department and after careful evaluation, we did not find any emergent condition requiring admission or further testing in the hospital.  Your exam/testing today is overall reassuring.  Keep your follow-up with your regular doctor and your thyroid ultrasound.  Can use the oxycodone for more significant pain.  Otherwise continue using Tylenol at home.  Please return to the Emergency  Department if you experience any worsening of your condition.   Thank you for allowing [place] to be a part of your care.       Maudie Flakes, MD 09/11/22 [phone]

## 2022-09-11 NOTE — Discharge Instructions (Signed)
You were evaluated in the Emergency Department and after careful evaluation, we did not find any emergent condition requiring admission or further testing in the hospital.  Your exam/testing today is overall reassuring.  Keep your follow-up with your regular doctor and your thyroid ultrasound.  Can use the oxycodone for more significant pain.  Otherwise continue using Tylenol at home.  Please return to the Emergency Department if you experience any worsening of your condition.   Thank you for allowing [place] to be a part of your care.

## 2022-09-19 ENCOUNTER — Ambulatory Visit: Payer: Medicare HMO | Admitting: Family Medicine

## 2022-09-23 ENCOUNTER — Encounter: Payer: Self-pay | Admitting: Family Medicine

## 2022-09-23 ENCOUNTER — Ambulatory Visit (INDEPENDENT_AMBULATORY_CARE_PROVIDER_SITE_OTHER): Payer: Medicare HMO | Admitting: Family Medicine

## 2022-09-23 ENCOUNTER — Ambulatory Visit: Payer: Medicare HMO | Admitting: Family Medicine

## 2022-09-23 VITALS — BP 118/74 | HR 60 | Temp 98.7°F | Ht 73.0 in | Wt 205.0 lb

## 2022-09-23 DIAGNOSIS — I10 Essential (primary) hypertension: Secondary | ICD-10-CM | POA: Diagnosis not present

## 2022-09-23 DIAGNOSIS — G8929 Other chronic pain: Secondary | ICD-10-CM | POA: Diagnosis not present

## 2022-09-23 DIAGNOSIS — E11649 Type 2 diabetes mellitus with hypoglycemia without coma: Secondary | ICD-10-CM

## 2022-09-23 DIAGNOSIS — E785 Hyperlipidemia, unspecified: Secondary | ICD-10-CM | POA: Diagnosis not present

## 2022-09-23 DIAGNOSIS — M25561 Pain in right knee: Secondary | ICD-10-CM | POA: Diagnosis not present

## 2022-09-23 DIAGNOSIS — E042 Nontoxic multinodular goiter: Secondary | ICD-10-CM | POA: Diagnosis not present

## 2022-09-23 DIAGNOSIS — R6884 Jaw pain: Secondary | ICD-10-CM

## 2022-09-23 DIAGNOSIS — M542 Cervicalgia: Secondary | ICD-10-CM

## 2022-09-23 DIAGNOSIS — M25562 Pain in left knee: Secondary | ICD-10-CM

## 2022-09-23 LAB — LIPID PANEL
Cholesterol: 145 mg/dL (ref 0–200)
HDL: 48.9 mg/dL (ref >39.00)
LDL Cholesterol: 75 mg/dL (ref 0–99)
NonHDL: 96.15
Total CHOL/HDL Ratio: 3
Triglycerides: 107 mg/dL (ref 0.0–149.0)
VLDL: 21.4 mg/dL (ref 0.0–40.0)

## 2022-09-23 LAB — COMPREHENSIVE METABOLIC PANEL
ALT: 14 U/L (ref 0–53)
AST: 17 U/L (ref 0–37)
Albumin: 4.4 g/dL (ref 3.5–5.2)
Alkaline Phosphatase: 82 U/L (ref 39–117)
BUN: 7 mg/dL (ref 6–23)
CO2: 31 mEq/L (ref 19–32)
Calcium: 9.3 mg/dL (ref 8.4–10.5)
Chloride: 99 mEq/L (ref 96–112)
Creatinine, Ser: 0.67 mg/dL (ref 0.40–1.50)
GFR: 97.98 mL/min (ref >60.00)
Glucose, Bld: 131 mg/dL — ABNORMAL HIGH (ref 70–99)
Potassium: 3.7 mEq/L (ref 3.5–5.1)
Sodium: 135 mEq/L (ref 135–145)
Total Bilirubin: 0.3 mg/dL (ref 0.2–1.2)
Total Protein: 7.6 g/dL (ref 6.0–8.3)

## 2022-09-23 LAB — MICROALBUMIN / CREATININE URINE RATIO
Creatinine,U: 29 mg/dL
Microalb Creat Ratio: 2.4 mg/g (ref 0.0–30.0)
Microalb, Ur: <0.7 mg/dL (ref 0.0–1.9)

## 2022-09-23 LAB — HEMOGLOBIN A1C: Hgb A1c MFr Bld: 7.4 % — ABNORMAL HIGH (ref 4.6–6.5)

## 2022-09-23 MED ORDER — MELOXICAM 7.5 MG PO TABS: 7.5000 mg | ORAL_TABLET | Freq: Every day | ORAL | 0 refills | Status: DC

## 2022-09-23 MED ORDER — CYCLOBENZAPRINE HCL 5 MG PO TABS: 5.0000 mg | ORAL_TABLET | Freq: Three times a day (TID) | ORAL | 1 refills | Status: DC | PRN

## 2022-09-23 NOTE — Patient Instructions (Signed)
Try meloxicam once per day to see if that will help with the neck pain from arthritis/wear-and-tear changes of the neck, and may also help with the jaw pain and front of the neck pain.  Muscle relaxant up to every 8 hours, can start with 1/2 pill initially and at bedtime as that can cause sedation.  I will refer you to ear nose and throat specialist as we discussed, but if any acute worsening of symptoms or new symptoms be seen right away.  Okay to continue Tylenol.  I will also refer you to orthopedics for the chronic knee pain.  Follow-up in 3 weeks to discuss labs and recheck neck but I am happy to see you sooner if needed or if other new concerns.  Take care!  Return to the clinic or go to the nearest emergency room if any of your symptoms worsen or new symptoms occur.

## 2022-09-23 NOTE — Progress Notes (Signed)
Subjective:  Patient ID: Brendan Gonzales, male    DOB: 1956-03-12  Age: 67 y.o. MRN: [id number]  CC:  Chief Complaint  Patient presents with   Neck Pain    Pt states that he is having pain in his throat, neck and back of his neck. Pt states pain has been persistent for 2 months.   Results    Review xray results     HPI Kaycen Duffy presents for    Anterior neck pain: Discussed at his last visit in March.  Had been seen by urgent care in February.  Possible sialolithiasis with anterior neck pain but some muscular tenderness in his neck at that time.  He tried sour candies, increasing fluid without improvement.  Tylenol 4 times per day.  Thyroid nodules were noted on CT scan in 2018, ultrasound in 2018 with 2 nodules, with plan for FNA but that was not performed previously.  Seen in ER March 20.  CT soft tissue neck, thyroid diffusely enlarged with nodule at the mid left thyroid lobe measuring up to approximately 2.4 cm.  No enlarged or pathologic adenopathy within the neck.  Normal intravascular enhancement.  Salivary glands within normal limits.  No discrete or worrisome osseous lesions.  Plan for ultrasound on April 15.  Jaw pain Diffuse bilateral jaw pain extending beyond TMJ at his last visit.  Mandibular imaging initially ordered then plan for CT versus ENT eval.  No worrisome osseous lesions on CT scan March 20.  No acute intracranial abnormality. Has continued to have pain in jaw - diffusely and under jaw both sides. Able to swallow liquids and foods, but sore to swallow. No fever.   Posterior neck pain Also discussed last visit, possible spasm, cervical spine degenerative disease.  C-spine x-ray ordered with plan for initial range of motion, stretches, warm compresses.  He was seen at ER on March 20 with imaging as above as he was taking Tylenol and ibuprofen without relief.  No acute fracture or listhesis of the cervical spine but did have some progressive degenerative disc disease  within the mid to lower cervical spine.  Trying some range of motion, tylenol. No recent ibuprofen - only 1st week.  Tylenol up to every 6 hours. Pain resolves with tylenol, but pain returns. No arm radiation or weakness.   No hx of PUD, or CKD.   Chronic knee pain - requests repeat referral to [facility]. Pain has continued - both knees - would like to be seen again to discuss options - has not tried calling yet.   Also due for updated labs for hyperlipidemia, diabetes discussed at his March visit.  History Patient Active Problem List   Diagnosis Date Noted   Hyperlipidemia 09/05/2022   Hospice care patient 07/06/2019   Eczema of hand 10/19/2014   Type 2 diabetes mellitus 06/13/2013   Essential hypertension 06/13/2013   Normal coronary arteries- 2008 06/13/2013   Chest pain with moderate risk of acute coronary syndrome 06/12/2013   Special screening for malignant neoplasms, colon 06/27/2011   Arthritis of knee 06/14/2011   Past Medical History:  Diagnosis Date   Arthritis    Constipation    Diabetes mellitus    type 2   Dizziness    Headache(784.0)    Hyperlipidemia    Hypertension    benign   Past Surgical History:  Procedure Laterality Date   COLONOSCOPY     CORONARY ANGIOPLASTY     HEMORRHOID SURGERY     right femeroral  bypass     right knee surgery     Allergies  Allergen Reactions   Metoprolol Diarrhea   Prior to Admission medications   Medication Sig Start Date End Date Taking? Authorizing Provider  acetaminophen (TYLENOL) 500 MG tablet Take 500 mg by mouth 3 (three) times daily as needed for mild pain.    Yes [provider]  amLODipine (NORVASC) 5 MG tablet Take 1 tablet (5 mg total) by mouth daily. 06/19/22  Yes [name], MD  aspirin 81 MG tablet Take 81 mg by mouth every morning.   Yes [provider]  atorvastatin (LIPITOR) 10 MG tablet Take 1 tablet (10 mg total) by mouth daily. 09/05/22  Yes [name], MD   glimepiride (AMARYL) 1 MG tablet Take 1 tablet (1 mg total) by mouth daily with breakfast. 06/19/22  Yes [name], MD  ibuprofen (ADVIL) 600 MG tablet Take 1 tablet (600 mg total) by mouth every 6 (six) hours as needed. 08/08/22  Yes Melynda Ripple, MD  lisinopril-hydrochlorothiazide (ZESTORETIC) 20-12.5 MG tablet Take 2 tablets by mouth daily. 09/05/22  Yes [name], MD  metFORMIN (GLUCOPHAGE) 500 MG tablet Take 2 tablets (1,000 mg total) by mouth 2 (two) times daily. 09/05/22  Yes [name], MD  pantoprazole (PROTONIX) 40 MG tablet Take 1 tablet (40 mg total) by mouth daily. 06/19/22  Yes [name], MD  oxyCODONE (ROXICODONE) 5 MG immediate release tablet Take 1 tablet (5 mg total) by mouth every 4 (four) hours as needed for severe pain. Patient not taking: Reported on 09/23/2022 09/11/22   Maudie Flakes, MD   Social History   Socioeconomic History   Marital status: Married    Spouse name: Not on file   Number of children: Not on file   Years of education: Not on file   Highest education level: Not on file  Occupational History   Not on file  Tobacco Use   Smoking status: Former   Smokeless tobacco: Never  Substance and Sexual Activity   Alcohol use: No   Drug use: No   Sexual activity: Never  Other Topics Concern   Not on file  Social History Narrative   Not on file   Social Determinants of Health   Financial Resource Strain: Not on file  Food Insecurity: Not on file  Transportation Needs: Not on file  Physical Activity: Not on file  Stress: Not on file  Social Connections: Not on file  Intimate Partner Violence: Not on file    Review of Systems Per HPI.   Objective:   Vitals:   09/23/22 0925  BP: 118/74  Pulse: 60  Temp: 98.7 F (37.1 C)  TempSrc: Temporal  SpO2: 100%  Weight: 205 lb (93 kg)  Height: 6\' 1"  (1.854 m)     Physical Exam Vitals reviewed.  Constitutional:      Appearance: He is well-developed.  HENT:      Head: Normocephalic and atraumatic.     Right Ear: Ear canal normal.     Left Ear: Ear canal normal.     Ears:     Comments: Minimal cerumen on the right, otherwise canals, external ears normal.    Mouth/Throat:     Mouth: Mucous membranes are moist.     Pharynx: Oropharynx is clear. No oropharyngeal exudate or posterior oropharyngeal erythema.  Neck:     Vascular: No carotid bruit or JVD.     Comments: Diffusely tender at the  submandibular area bilateral neck as well as slightly lower and reports some discomfort in the jaw but no focal tenderness.  No pain with opening/closing or lateral deviation of the jaw, no click or pain at the TMJ bilaterally.  Speaking without difficulty, clearing secretions, no stridor. Cardiovascular:     Rate and Rhythm: Normal rate and regular rhythm.     Heart sounds: Normal heart sounds. No murmur heard. Pulmonary:     Effort: Pulmonary effort is normal.     Breath sounds: Normal breath sounds. No rales.  Musculoskeletal:     Cervical back: Tenderness present.     Right lower leg: No edema.     Left lower leg: No edema.     Comments: Upper paraspinals with spasm, tenderness at C-spine, no focal midline bony tenderness.  Skin:    General: Skin is warm and dry.  Neurological:     Mental Status: He is alert and oriented to person, place, and time.  Psychiatric:        Mood and Affect: Mood normal.      Assessment & Plan:  Salbador Shehan is a 67 y.o. male . Neck pain - Plan: Ambulatory referral to ENT, meloxicam (MOBIC) 7.5 MG tablet, cyclobenzaprine (FLEXERIL) 5 MG tablet Jaw pain - Plan: Ambulatory referral to ENT, meloxicam (MOBIC) 7.5 MG tablet Multiple thyroid nodules  -Appears to have multiple locations of pain, possible multifactorial neck pain both anterior and posterior.  Posterior neck pain likely cervical degenerative disc disease option of short-term Flexeril with potential side effects discussed as well as meloxicam.  Consider Ortho or PT  eval if not improving.  -Anterior neck pain with thyroid nodules, planned ultrasound to further differentiate nodules and decide if FNA or surgical evaluation needed.  Ultrasound pending.  No other concerning soft tissue findings or lymphadenopathy on CT imaging and no change in symptoms since that testing.  Does report some difficulty/discomfort with swallowing, but clearing secretions, able to swallow fluids and foods and normal speech.  Will refer to ENT to evaluate further and decide on further evaluation or testing.  ER precautions given.   -Diffuse jaw pain without bony concerns on CT.  Does not appear to be reproducible with TMJ motion or palpation.  Unlikely TMJ syndrome.  Again secondary eval with ENT as above with ER precautions.  Question referred pain from cervical spine?  Mobic, Flexeril as above.  Essential hypertension  -Discussed last visit, screening labs ordered.  Hyperlipidemia, unspecified hyperlipidemia type - Plan: Lipid panel, Comprehensive metabolic panel  -Tolerating current regimen, discussed last visit, screening labs ordered  Type 2 diabetes mellitus with hypoglycemia without coma, without long-term current use of insulin - Plan: Lipid panel, Comprehensive metabolic panel, Hemoglobin A1c, Microalbumin / creatinine urine ratio  -Tolerating current med regimen, discussed last visit, updated labs ordered with adjustment in plan accordingly  Chronic pain of both knees - Plan: Ambulatory referral to Orthopedic Surgery  -Chronic pain, similar to symptoms in the past without recent injury, has been evaluated by Ortho previously, would like to meet with them again to discuss treatment options, referral placed.  Meds ordered this encounter  Medications   meloxicam (MOBIC) 7.5 MG tablet    Sig: Take 1 tablet (7.5 mg total) by mouth daily.    Dispense:  30 tablet    Refill:  0   cyclobenzaprine (FLEXERIL) 5 MG tablet    Sig: Take 1 tablet (5 mg total) by mouth 3 (three) times  daily as needed for muscle  spasms (start qhs prn due to sedation).    Dispense:  15 tablet    Refill:  1   Patient Instructions  Try meloxicam once per day to see if that will help with the neck pain from arthritis/wear-and-tear changes of the neck, and may also help with the jaw pain and front of the neck pain.  Muscle relaxant up to every 8 hours, can start with 1/2 pill initially and at bedtime as that can cause sedation.  I will refer you to ear nose and throat specialist as we discussed, but if any acute worsening of symptoms or new symptoms be seen right away.  Okay to continue Tylenol.  I will also refer you to orthopedics for the chronic knee pain.  Follow-up in 3 weeks to discuss labs and recheck neck but I am happy to see you sooner if needed or if other new concerns.  Take care!  Return to the clinic or go to the nearest emergency room if any of your symptoms worsen or new symptoms occur.     Signed,   [name], MD [place] 09/23/22 10:32 AM

## 2022-09-24 ENCOUNTER — Encounter: Payer: Self-pay | Admitting: Family Medicine

## 2022-10-01 DIAGNOSIS — M25562 Pain in left knee: Secondary | ICD-10-CM | POA: Diagnosis not present

## 2022-10-01 DIAGNOSIS — M170 Bilateral primary osteoarthritis of knee: Secondary | ICD-10-CM | POA: Diagnosis not present

## 2022-10-01 DIAGNOSIS — M25561 Pain in right knee: Secondary | ICD-10-CM | POA: Diagnosis not present

## 2022-10-04 ENCOUNTER — Ambulatory Visit
Admission: RE | Admit: 2022-10-04 | Discharge: 2022-10-04 | Disposition: A | Discharge status: Home / Self Care | Payer: Medicare HMO | Source: Ambulatory Visit | Attending: Family Medicine | Admitting: Family Medicine

## 2022-10-04 DIAGNOSIS — R6884 Jaw pain: Secondary | ICD-10-CM

## 2022-10-07 ENCOUNTER — Encounter: Payer: Self-pay | Admitting: Family Medicine

## 2022-10-07 ENCOUNTER — Ambulatory Visit
Admission: RE | Admit: 2022-10-07 | Discharge: 2022-10-07 | Disposition: A | Discharge status: Home / Self Care | Payer: Medicare HMO | Source: Ambulatory Visit | Attending: Family Medicine | Admitting: Family Medicine

## 2022-10-07 DIAGNOSIS — M542 Cervicalgia: Secondary | ICD-10-CM | POA: Diagnosis not present

## 2022-10-07 DIAGNOSIS — E041 Nontoxic single thyroid nodule: Secondary | ICD-10-CM | POA: Diagnosis not present

## 2022-10-07 DIAGNOSIS — R6884 Jaw pain: Secondary | ICD-10-CM | POA: Diagnosis not present

## 2022-10-07 DIAGNOSIS — M47812 Spondylosis without myelopathy or radiculopathy, cervical region: Secondary | ICD-10-CM | POA: Diagnosis not present

## 2022-10-07 DIAGNOSIS — E042 Nontoxic multinodular goiter: Secondary | ICD-10-CM

## 2022-10-08 ENCOUNTER — Other Ambulatory Visit: Payer: Self-pay | Admitting: Family Medicine

## 2022-10-08 DIAGNOSIS — E042 Nontoxic multinodular goiter: Secondary | ICD-10-CM

## 2022-10-08 NOTE — Progress Notes (Signed)
See ultrasound results. Will order FNA.

## 2022-10-09 ENCOUNTER — Other Ambulatory Visit: Payer: Self-pay | Admitting: Family Medicine

## 2022-10-09 DIAGNOSIS — E042 Nontoxic multinodular goiter: Secondary | ICD-10-CM

## 2022-10-09 NOTE — Progress Notes (Signed)
Disregard prior referral to interventional radiology. FNA ordered.

## 2022-10-17 ENCOUNTER — Ambulatory Visit (INDEPENDENT_AMBULATORY_CARE_PROVIDER_SITE_OTHER): Payer: Medicare HMO | Admitting: Family Medicine

## 2022-10-17 ENCOUNTER — Encounter: Payer: Self-pay | Admitting: Family Medicine

## 2022-10-17 VITALS — BP 136/72 | HR 67 | Temp 97.9°F | Ht 73.0 in | Wt 208.4 lb

## 2022-10-17 DIAGNOSIS — E042 Nontoxic multinodular goiter: Secondary | ICD-10-CM

## 2022-10-17 DIAGNOSIS — R6884 Jaw pain: Secondary | ICD-10-CM

## 2022-10-17 DIAGNOSIS — K119 Disease of salivary gland, unspecified: Secondary | ICD-10-CM | POA: Diagnosis not present

## 2022-10-17 DIAGNOSIS — M542 Cervicalgia: Secondary | ICD-10-CM

## 2022-10-17 MED ORDER — MELOXICAM 7.5 MG PO TABS: 7.5000 mg | ORAL_TABLET | Freq: Every day | ORAL | 0 refills | Status: DC

## 2022-10-17 NOTE — Progress Notes (Signed)
Subjective:  Patient ID: Brendan Gonzales, male    DOB: 1955-09-20  Age: 67 y.o. MRN: [id number]  CC:  Chief Complaint  Patient presents with   Neck Pain    Pt states he still has been having neck pain     HPI Brendan Gonzales presents for   Anterior neck pain, jaw pain, thyroid nodules See prior visits, initially treated as possible sialolithiasis, no changes with sour candy. thyroid nodules noted previously, repeat ultrasound with plan for surgical follow-up -he was referred to ENT for jaw pain, neck pain and plan for discussion of thyroid nodules but I did order FNA as well.  CT soft tissue neck on March 20 with 2.4 cm left thyroid lobe nodule.  No enlarged or pathologic adenopathy within the neck.  Salivary glands included parotid and submandibular glands normal at that time.  Normal intravascular enhancement seen throughout the neck.  Mandibular x-ray on April 15 without evidence of fracture or other focal bone lesions and no evidence of mandibular subluxation.  Thyroid ultrasound April 15 -dominant 3.4 cm left mid thyroid nodule -plan for FNA.  1.3 cm left inferior thyroid nodule, plan for follow-up ultrasound in 1 year. ENT appointment scheduled May 15. Fine-needle aspiration May 6.  Still some pain in jaw, but better, pain in neck is also a little better.  Eating, drinking ok and swallowing normally.   Posterior neck pain Progressive degenerative disc disease within the mid to lower C-spine noted on CT on March 20.  Possible contributor to his posterior neck pain.  Short-term Flexeril and meloxicam provided at April 1 visit, also referred to orthopedics for knee pain at that time, could meet with Ortho or PT for neck as well if not improving. Took mobic QD - still taking. Helping pain. Minimal symptoms now. Improved, no radicular symptoms.  Tried flexeril  every few days - when day off next day.    Knee pain: See last visit. Saw ortho/sports medicine - had xrays, told had mild to moderate  arthritis and plan for shots over next 6 months. Dr Penni Bombard - authorized Euflexxa. Will be scheduling appointment for injection.      History Patient Active Problem List   Diagnosis Date Noted   Hyperlipidemia 09/05/2022   Hospice care patient 07/06/2019   Eczema of hand 10/19/2014   Type 2 diabetes mellitus 06/13/2013   Essential hypertension 06/13/2013   Normal coronary arteries- 2008 06/13/2013   Chest pain with moderate risk of acute coronary syndrome 06/12/2013   Special screening for malignant neoplasms, colon 06/27/2011   Arthritis of knee 06/14/2011   Past Medical History:  Diagnosis Date   Arthritis    Constipation    Diabetes mellitus    type 2   Dizziness    Headache(784.0)    Hyperlipidemia    Hypertension    benign   Past Surgical History:  Procedure Laterality Date   COLONOSCOPY     CORONARY ANGIOPLASTY     HEMORRHOID SURGERY     right femeroral bypass     right knee surgery     Allergies  Allergen Reactions   Metoprolol Diarrhea   Prior to Admission medications   Medication Sig Start Date End Date Taking? Authorizing Provider  acetaminophen (TYLENOL) 500 MG tablet Take 500 mg by mouth 3 (three) times daily as needed for mild pain.    Yes [provider]  amLODipine (NORVASC) 5 MG tablet Take 1 tablet (5 mg total) by mouth daily. 06/19/22  Yes Neva Seat,  Asencion Partridge, MD  aspirin 81 MG tablet Take 81 mg by mouth every morning.   Yes [provider]  atorvastatin (LIPITOR) 10 MG tablet Take 1 tablet (10 mg total) by mouth daily. 09/05/22  Yes [name], MD  cyclobenzaprine (FLEXERIL) 5 MG tablet Take 1 tablet (5 mg total) by mouth 3 (three) times daily as needed for muscle spasms (start qhs prn due to sedation). 09/23/22  Yes [name], MD  glimepiride (AMARYL) 1 MG tablet Take 1 tablet (1 mg total) by mouth daily with breakfast. 06/19/22  Yes [name], MD  ibuprofen (ADVIL) 600 MG tablet Take 1 tablet (600 mg total) by  mouth every 6 (six) hours as needed. 08/08/22  Yes Domenick Gong, MD  lisinopril-hydrochlorothiazide (ZESTORETIC) 20-12.5 MG tablet Take 2 tablets by mouth daily. 09/05/22  Yes [name], MD  meloxicam (MOBIC) 7.5 MG tablet Take 1 tablet (7.5 mg total) by mouth daily. 09/23/22  Yes [name], MD  metFORMIN (GLUCOPHAGE) 500 MG tablet Take 2 tablets (1,000 mg total) by mouth 2 (two) times daily. 09/05/22  Yes [name], MD  oxyCODONE (ROXICODONE) 5 MG immediate release tablet Take 1 tablet (5 mg total) by mouth every 4 (four) hours as needed for severe pain. 09/11/22  Yes Sabas Sous, MD  pantoprazole (PROTONIX) 40 MG tablet Take 1 tablet (40 mg total) by mouth daily. 06/19/22  Yes [name], MD   Social History   Socioeconomic History   Marital status: Married    Spouse name: Not on file   Number of children: Not on file   Years of education: Not on file   Highest education level: Not on file  Occupational History   Not on file  Tobacco Use   Smoking status: Former   Smokeless tobacco: Never  Substance and Sexual Activity   Alcohol use: No   Drug use: No   Sexual activity: Never  Other Topics Concern   Not on file  Social History Narrative   Not on file   Social Determinants of Health   Financial Resource Strain: Not on file  Food Insecurity: Not on file  Transportation Needs: Not on file  Physical Activity: Not on file  Stress: Not on file  Social Connections: Not on file  Intimate Partner Violence: Not on file    Review of Systems Per HPI  Objective:   Vitals:   10/17/22 1014  BP: 136/72  Pulse: 67  Temp: 97.9 F (36.6 C)  TempSrc: Temporal  SpO2: 98%  Weight: 208 lb 6.4 oz (94.5 kg)  Height: 6\' 1"  (1.854 m)     Physical Exam Vitals reviewed.  Constitutional:      Appearance: He is well-developed.  HENT:     Head: Normocephalic and atraumatic.  Neck:     Vascular: No carotid bruit or JVD.     Comments: C-spine, no  midline bony tenderness.  Decreased extension, slight decreased rotation bilaterally and lateral flexion bilaterally but pain-free motion.  No appreciable paraspinal spasm.  Anterior neck, prominent thyroid, fullness right greater than left lower.  Nontender. Mandible nontender and pain-free opening, closing and lateral jaw motion.  No clicks.  Tender at submandibular gland on the left, right side nontender. No exudate seen in mouth with pressure on gland.  Cardiovascular:     Rate and Rhythm: Normal rate and regular rhythm.     Heart sounds: Normal heart sounds. No murmur heard. Pulmonary:  Effort: Pulmonary effort is normal.     Breath sounds: Normal breath sounds. No rales.  Musculoskeletal:     Right lower leg: No edema.     Left lower leg: No edema.  Skin:    General: Skin is warm and dry.  Neurological:     Mental Status: He is alert and oriented to person, place, and time.  Psychiatric:        Mood and Affect: Mood normal.        Assessment & Plan:  Brendan Gonzales is a 67 y.o. male . Neck pain - Plan: meloxicam (MOBIC) 7.5 MG tablet  -Posterior neck pain improved, continue meloxicam for now.  Muscle relaxants as needed short-term with potential side effects discussed.  If not continue to improve or persistent need for anti-inflammatory/muscle relaxant would recommend Ortho follow-up to decide on other imaging versus trial of physical therapy.  Multiple thyroid nodules  -Left thyroid nodule FNA pending, then likely follow-up with ENT versus general surgery  Salivary gland disorder  -Left anterior neck pain at submandibular gland.  Persistent symptoms since urgent care visit without significant changes other than some subjective improvement on meloxicam.  Question inflammatory component.  Less likely infectious sialoadenitis.  Sialolithiasis still possible.  Discussed reasons for follow-up with ENT, but can try sour food/candy temporarily to see if that helps, symptomatic  care and RTC precautions given.  Given timing of symptoms less likely infection especially with previous reassuring imaging.  If persistent -  repeat imaging may be needed, but can also be discussed with ENT.  Jaw pain - Plan: meloxicam (MOBIC) 7.5 MG tablet  -Jaw pain resolved, normal motion in office, nontender in office.  Meloxicam as above for posterior neck pain, if jaw pain returns after completion of meloxicam, consider other imaging and discussion with ENT as planned.  No orders of the defined types were placed in this encounter.  Patient Instructions  I'm glad that the back of the neck pain is better. Ok to continue meloxicam for another week, but then try just tylenol. If pain worsens or not continuing to improve I would like you to meet with ortho toe decide if other testing needed or a trial of physical therapy. Let me know.   I am glad to hear that the neck and jaw pain has improved. If the neck pain and jaw pain go away, then could cancel the Ear nose and throat specialist appointment, but I also want them to see you for the swelling, soreness of the area under your left jaw. You may also want to wait until after results of the thyroid fine needle aspiration to decide on cancelling that appointment.   Ok to try sour candies  like lemon again to see if that helps the swelling/soreness under your left jaw. If that worsens (more swelling, pain or fevers) be seen right away.   Return to the clinic or go to the nearest emergency room if any of your symptoms worsen or new symptoms occur.           Signed,   Meredith Staggers, MD [facility], [facility] 10/17/22 10:38 AM

## 2022-10-17 NOTE — Patient Instructions (Addendum)
I'm glad that the back of the neck pain is better. Ok to continue meloxicam for another week, but then try just tylenol. If pain worsens or not continuing to improve I would like you to meet with ortho toe decide if other testing needed or a trial of physical therapy. Let me know.   I am glad to hear that the neck and jaw pain has improved. If the neck pain and jaw pain go away, then could cancel the Ear nose and throat specialist appointment, but I also want them to see you for the swelling, soreness of the area under your left jaw. You may also want to wait until after results of the thyroid fine needle aspiration to decide on cancelling that appointment.   Ok to try sour candies  like lemon again to see if that helps the swelling/soreness under your left jaw. If that worsens (more swelling, pain or fevers) be seen right away.   Return to the clinic or go to the nearest emergency room if any of your symptoms worsen or new symptoms occur.

## 2022-10-28 ENCOUNTER — Ambulatory Visit
Admission: RE | Admit: 2022-10-28 | Discharge: 2022-10-28 | Disposition: A | Discharge status: Home / Self Care | Payer: Medicare HMO | Source: Ambulatory Visit | Attending: Family Medicine | Admitting: Family Medicine

## 2022-10-28 ENCOUNTER — Other Ambulatory Visit (HOSPITAL_COMMUNITY)
Admission: RE | Admit: 2022-10-28 | Discharge: 2022-10-28 | Disposition: A | Discharge status: Home / Self Care | Payer: Medicare HMO | Source: Ambulatory Visit | Attending: Diagnostic Radiology | Admitting: Diagnostic Radiology

## 2022-10-28 DIAGNOSIS — E041 Nontoxic single thyroid nodule: Secondary | ICD-10-CM | POA: Diagnosis not present

## 2022-10-28 DIAGNOSIS — R911 Solitary pulmonary nodule: Secondary | ICD-10-CM | POA: Diagnosis not present

## 2022-10-28 DIAGNOSIS — E042 Nontoxic multinodular goiter: Secondary | ICD-10-CM

## 2022-10-30 ENCOUNTER — Encounter: Payer: Self-pay | Admitting: Family Medicine

## 2022-10-30 LAB — CYTOLOGY - NON PAP

## 2022-10-30 NOTE — Telephone Encounter (Signed)
Pt states he viewed his biopsy results via Mychart and would like for you to review the results with him before his upcoming ENT appointment to determine if ENT apportionment would still be necessary.

## 2022-11-01 NOTE — Telephone Encounter (Signed)
Replied by result note. 

## 2022-11-12 ENCOUNTER — Other Ambulatory Visit: Payer: Self-pay

## 2022-11-12 DIAGNOSIS — Z1211 Encounter for screening for malignant neoplasm of colon: Secondary | ICD-10-CM

## 2022-11-14 DIAGNOSIS — M25562 Pain in left knee: Secondary | ICD-10-CM | POA: Diagnosis not present

## 2022-11-20 ENCOUNTER — Encounter: Payer: Self-pay | Admitting: Family Medicine

## 2022-11-20 ENCOUNTER — Ambulatory Visit (INDEPENDENT_AMBULATORY_CARE_PROVIDER_SITE_OTHER): Payer: Medicare HMO | Admitting: Family Medicine

## 2022-11-20 VITALS — BP 122/72 | HR 58 | Temp 97.9°F | Ht 73.0 in | Wt 208.2 lb

## 2022-11-20 DIAGNOSIS — E042 Nontoxic multinodular goiter: Secondary | ICD-10-CM | POA: Diagnosis not present

## 2022-11-20 DIAGNOSIS — M542 Cervicalgia: Secondary | ICD-10-CM | POA: Diagnosis not present

## 2022-11-20 DIAGNOSIS — R6884 Jaw pain: Secondary | ICD-10-CM | POA: Diagnosis not present

## 2022-11-20 NOTE — Patient Instructions (Signed)
Glad to hear you are doing well.  Occasional cyclobenzaprine is okay if needed for muscle spasm with neck pain, or occasional meloxicam for a day or 2 is okay if you needed for the neck pain but would prefer you not use that continuously due to some potential risks and side effects.  Follow-up in 2 months and we can discuss your chronic medications and recheck labs for diabetes at that time.  Keep follow-up with your orthopedist for the knee pain, hope it will improve soon.  Take care.

## 2022-11-20 NOTE — Progress Notes (Signed)
Subjective:  Patient ID: Brendan Gonzales, male    DOB: 01-Oct-1955  Age: 67 y.o. MRN: [id number]  CC:  Chief Complaint  Patient presents with   Medical Management of Chronic Issues    Pt is fasting    HPI Brendan Gonzales presents for   Multiple thyroid nodules: See prior notes.  Multiple nodules on ultrasound April 15, with dominant 3.4 cm left mid thyroid nodule, FNA biopsy performed May 6, benign follicular nodule. I did recommend that he continue follow-up as planned with ENT as he has been experiencing persistent jaw pain, and pain under his jaw, although that was improving some at his April 25 visit.  Previous imaging in March without enlarged or pathologic adenopathy within the neck and salivary glands including his parotid and submandibular glands were normal at that time.  Mandibular x-ray on April 15 without evidence of fracture or other focal bone lesions and no evidence of mandibular subluxation.  Cancelled ENT appt - jaw pain better. Only minimal soreness if turning left - side to back of neck. No difficulty swallowing.   Posterior neck pain Progressive degenerative disc disease within the mid to lower C-spine noted on CT in March.  Short-term Flexeril, meloxicam had been prescribed previously and referred to orthopedics for knee pain with option to discuss neck symptoms as well.  Improving at his April 25 visit with Mobic, minimal symptoms at that time without radicular symptoms.  Flexeril intermittently at that time. Stopped mobic. Minimal soreness with turning left.   Infrequent use of flexeril - every 4-5 days - has taken for knee soreness ( discussed avoidance for knee) - planned injections next month with ortho.  Has topical cream with ibuprofen, baclofen, gabapentin, lidocaine.     A1c and other labs, discussion of chronic meds April 1.   History Patient Active Problem List   Diagnosis Date Noted   Hyperlipidemia 09/05/2022   Hospice care patient 07/06/2019   Eczema  of hand 10/19/2014   Type 2 diabetes mellitus (HCC) 06/13/2013   Essential hypertension 06/13/2013   Normal coronary arteries- 2008 06/13/2013   Chest pain with moderate risk of acute coronary syndrome 06/12/2013   Special screening for malignant neoplasms, colon 06/27/2011   Arthritis of knee 06/14/2011   Past Medical History:  Diagnosis Date   Arthritis    Constipation    Diabetes mellitus    type 2   Dizziness    Headache(784.0)    Hyperlipidemia    Hypertension    benign   Past Surgical History:  Procedure Laterality Date   COLONOSCOPY     CORONARY ANGIOPLASTY     HEMORRHOID SURGERY     right femeroral bypass     right knee surgery     Allergies  Allergen Reactions   Metoprolol Diarrhea   Prior to Admission medications   Medication Sig Start Date End Date Taking? Authorizing Provider  acetaminophen (TYLENOL) 500 MG tablet Take 500 mg by mouth 3 (three) times daily as needed for mild pain.    Yes [provider]  amLODipine (NORVASC) 5 MG tablet Take 1 tablet (5 mg total) by mouth daily. 06/19/22  Yes [name], MD  aspirin 81 MG tablet Take 81 mg by mouth every morning.   Yes [provider]  atorvastatin (LIPITOR) 10 MG tablet Take 1 tablet (10 mg total) by mouth daily. 09/05/22  Yes [name], MD  cyclobenzaprine (FLEXERIL) 5 MG tablet Take 1 tablet (5 mg total) by mouth 3 (  three) times daily as needed for muscle spasms (start qhs prn due to sedation). 09/23/22  Yes [name], MD  glimepiride (AMARYL) 1 MG tablet Take 1 tablet (1 mg total) by mouth daily with breakfast. 06/19/22  Yes [name], MD  lisinopril-hydrochlorothiazide (ZESTORETIC) 20-12.5 MG tablet Take 2 tablets by mouth daily. 09/05/22  Yes [name], MD  meloxicam (MOBIC) 7.5 MG tablet Take 1 tablet (7.5 mg total) by mouth daily. 10/17/22  Yes [name], MD  metFORMIN (GLUCOPHAGE) 500 MG tablet Take 2 tablets (1,000 mg total) by mouth 2 (two)  times daily. 09/05/22  Yes [name], MD  oxyCODONE (ROXICODONE) 5 MG immediate release tablet Take 1 tablet (5 mg total) by mouth every 4 (four) hours as needed for severe pain. 09/11/22  Yes Sabas Sous, MD  pantoprazole (PROTONIX) 40 MG tablet Take 1 tablet (40 mg total) by mouth daily. 06/19/22  Yes [name], MD  ibuprofen (ADVIL) 600 MG tablet Take 1 tablet (600 mg total) by mouth every 6 (six) hours as needed. Patient not taking: Reported on 11/20/2022 08/08/22   Domenick Gong, MD   Social History   Socioeconomic History   Marital status: Married    Spouse name: Not on file   Number of children: Not on file   Years of education: Not on file   Highest education level: Not on file  Occupational History   Not on file  Tobacco Use   Smoking status: Former   Smokeless tobacco: Never  Substance and Sexual Activity   Alcohol use: No   Drug use: No   Sexual activity: Never  Other Topics Concern   Not on file  Social History Narrative   Not on file   Social Determinants of Health   Financial Resource Strain: Not on file  Food Insecurity: Not on file  Transportation Needs: Not on file  Physical Activity: Not on file  Stress: Not on file  Social Connections: Not on file  Intimate Partner Violence: Not on file    Review of Systems Per hpi  Objective:   Vitals:   11/20/22 0909  BP: 122/72  Pulse: (!) 58  Temp: 97.9 F (36.6 C)  SpO2: 98%  Weight: 208 lb 3.2 oz (94.4 kg)  Height: 6\' 1"  (1.854 m)    Physical Exam Vitals reviewed.  Constitutional:      General: He is not in acute distress.    Appearance: Normal appearance. He is well-developed.  HENT:     Head: Normocephalic and atraumatic.  Neck:     Comments: Prominent thyroid on left, nontender, submandibular glands nontender, no appreciable lymphadenopathy or jaw pain on exam.  C-spine, no midline bony tenderness, slight discomfort into the lateral/left-sided posterior neck paraspinals  with left rotation.  Otherwise intact range of motion.  Upper extremity strength equal bilaterally, grip strength equal bilaterally. Cardiovascular:     Rate and Rhythm: Normal rate and regular rhythm.  Pulmonary:     Effort: Pulmonary effort is normal.     Breath sounds: Normal breath sounds.  Neurological:     Mental Status: He is alert and oriented to person, place, and time.  Psychiatric:        Mood and Affect: Mood normal.        Assessment & Plan:  Brendan Gonzales is a 67 y.o. male . Jaw pain  -Resolved, RTC precautions if returns.  Previous imaging reassuring  Multiple thyroid nodules  -Benign follicular nodule  noted on biopsy of largest nodule, reassuring.  No changes for now, follow-up ultrasound in 1 year for other areas.  Neck pain  -Improved.  Option of intermittent cyclobenzaprine or meloxicam if needed for flares but potential side effects and risk discussed, lowest effective dose for shortest duration possible if needed.  RTC precautions.  No orders of the defined types were placed in this encounter.  Patient Instructions  Glad to hear you are doing well.  Occasional cyclobenzaprine is okay if needed for muscle spasm with neck pain, or occasional meloxicam for a day or 2 is okay if you needed for the neck pain but would prefer you not use that continuously due to some potential risks and side effects.  Follow-up in 2 months and we can discuss your chronic medications and recheck labs for diabetes at that time.  Keep follow-up with your orthopedist for the knee pain, hope it will improve soon.  Take care.     Signed,   Meredith Staggers, MD [facility], [facility] 11/20/22 9:41 AM

## 2022-11-28 DIAGNOSIS — M170 Bilateral primary osteoarthritis of knee: Secondary | ICD-10-CM | POA: Diagnosis not present

## 2022-12-05 DIAGNOSIS — M170 Bilateral primary osteoarthritis of knee: Secondary | ICD-10-CM | POA: Diagnosis not present

## 2022-12-12 DIAGNOSIS — M170 Bilateral primary osteoarthritis of knee: Secondary | ICD-10-CM | POA: Diagnosis not present

## 2022-12-23 ENCOUNTER — Encounter: Payer: Self-pay | Admitting: Family Medicine

## 2022-12-23 DIAGNOSIS — M1712 Unilateral primary osteoarthritis, left knee: Secondary | ICD-10-CM | POA: Diagnosis not present

## 2022-12-23 DIAGNOSIS — M2392 Unspecified internal derangement of left knee: Secondary | ICD-10-CM | POA: Diagnosis not present

## 2022-12-24 ENCOUNTER — Telehealth: Payer: Self-pay | Admitting: Family Medicine

## 2022-12-24 NOTE — Telephone Encounter (Signed)
[facility] - Release of Information   Chart sheet attached and placed in front bin

## 2022-12-24 NOTE — Telephone Encounter (Signed)
Re-sent form with requested corrections

## 2022-12-28 DIAGNOSIS — M25562 Pain in left knee: Secondary | ICD-10-CM | POA: Diagnosis not present

## 2022-12-29 ENCOUNTER — Other Ambulatory Visit: Payer: Self-pay | Admitting: Family Medicine

## 2022-12-29 DIAGNOSIS — I10 Essential (primary) hypertension: Secondary | ICD-10-CM

## 2022-12-29 DIAGNOSIS — M1712 Unilateral primary osteoarthritis, left knee: Secondary | ICD-10-CM | POA: Insufficient documentation

## 2022-12-29 DIAGNOSIS — M2392 Unspecified internal derangement of left knee: Secondary | ICD-10-CM | POA: Insufficient documentation

## 2023-01-05 NOTE — Progress Notes (Unsigned)
Subjective:  Patient ID: Brendan Gonzales, male    DOB: Jul 03, 1955  Age: 67 y.o. MRN: [id number]  CC:  Chief Complaint  Patient presents with   Medical Management of Chronic Issues    Pt is getting surgery soon but needs his A1c under 7.5 for surgery    Health Maintenance    Pt is due for a colonoscopy but he states he is worried about his knee right now, he is willing to do home test if that is a feasible option but last colonoscopy 14 years ago     HPI Brendan Gonzales presents for    Left knee pain Under the care of orthopedics, Dr. Charlann Boxer, recent note reviewed July 7, injection with plan for MRI.  Degenerative changes versus possible meniscal disease.  Possible surgery with need for A1c under 7.5. Discussion about knee replacement. Appt with Ortho on 11/15/22.   Hypertension: Discussed in March, amlodipine 5 mg daily, lisinopril HCTZ 40/25 mg total per day. No new side effects.  Home readings: 134/74.  BP Readings from Last 3 Encounters:  01/06/23 128/80  11/20/22 122/72  10/17/22 136/72   Lab Results  Component Value Date   CREATININE 0.67 09/23/2022    Hyperlipidemia: Lipitor 10 mg daily, overall stable labs in April. No new side effects.  Lab Results  Component Value Date   CHOL 145 09/23/2022   HDL 48.90 09/23/2022   LDLCALC 75 09/23/2022   TRIG 107.0 09/23/2022   CHOLHDL 3 09/23/2022   Lab Results  Component Value Date   ALT 14 09/23/2022   AST 17 09/23/2022   ALKPHOS 82 09/23/2022   BILITOT 0.3 09/23/2022    Diabetes: With history of hyperglycemia Metformin 1000 mg twice daily, glimepiride 1mg  daily added September of last year, other meds have been cost prohibitive.  He is on ACE inhibitor as above, on Lipitor nightly as statin. Home readings Fasting 157 Postprandial 150-175 Symptomatic lows -possible borderline lows when discussed in the past, regular meals discussed.no recent lows.   Microalbumin: Normal ratio 09/23/2022 Optho, foot exam, pneumovax: Due  for foot exam, otherwise up-to-date.  Diabetic Foot Exam - Simple   Simple Foot Form Visual Inspection No deformities, no ulcerations, no other skin breakdown bilaterally: Yes Sensation Testing Intact to touch and monofilament testing bilaterally: Yes Pulse Check Posterior Tibialis and Dorsalis pulse intact bilaterally: Yes Comments      Lab Results  Component Value Date   HGBA1C 7.4 (H) 09/23/2022   HGBA1C 7.3 (H) 06/19/2022   HGBA1C 7.6 (H) 03/04/2022   Lab Results  Component Value Date   MICROALBUR <0.7 09/23/2022   LDLCALC 75 09/23/2022   CREATININE 0.67 09/23/2022   Health maintenance: Due for colonoscopy, previously in 2013. Dr. Bosie Clos.  Hyperplastic polyp removed at that time.  Small internal hemorrhoids. Declines referral for colonoscopy at this time, defers until after surgery.   History Patient Active Problem List   Diagnosis Date Noted   Osteoarthritis of left knee 12/29/2022   Derangement of left knee 12/29/2022   Hyperlipidemia 09/05/2022   Hospice care patient 07/06/2019   Eczema of hand 10/19/2014   Type 2 diabetes mellitus (HCC) 06/13/2013   Essential hypertension 06/13/2013   Normal coronary arteries- 2008 06/13/2013   Chest pain with moderate risk of acute coronary syndrome 06/12/2013   Special screening for malignant neoplasms, colon 06/27/2011   Arthritis of knee 06/14/2011   Past Medical History:  Diagnosis Date   Arthritis    Constipation    Diabetes mellitus  type 2   Dizziness    Headache(784.0)    Hyperlipidemia    Hypertension    benign   Past Surgical History:  Procedure Laterality Date   COLONOSCOPY     CORONARY ANGIOPLASTY     HEMORRHOID SURGERY     right femeroral bypass     right knee surgery     Allergies  Allergen Reactions   Metoprolol Diarrhea   Prior to Admission medications   Medication Sig Start Date End Date Taking? Authorizing Provider  acetaminophen (TYLENOL) 500 MG tablet Take 500 mg by mouth 3  (three) times daily as needed for mild pain.     [provider]  amLODipine (NORVASC) 5 MG tablet Take 1 tablet by mouth once daily 12/30/22   [name], MD  aspirin 81 MG tablet Take 81 mg by mouth every morning.    [provider]  atorvastatin (LIPITOR) 10 MG tablet Take 1 tablet (10 mg total) by mouth daily. 09/05/22   [name], MD  cyclobenzaprine (FLEXERIL) 5 MG tablet Take 1 tablet (5 mg total) by mouth 3 (three) times daily as needed for muscle spasms (start qhs prn due to sedation). 09/23/22   [name], MD  glimepiride (AMARYL) 1 MG tablet Take 1 tablet (1 mg total) by mouth daily with breakfast. 06/19/22   [name], MD  ibuprofen (ADVIL) 600 MG tablet Take 1 tablet (600 mg total) by mouth every 6 (six) hours as needed. Patient not taking: Reported on 11/20/2022 08/08/22   Domenick Gong, MD  lisinopril-hydrochlorothiazide (ZESTORETIC) 20-12.5 MG tablet Take 2 tablets by mouth daily. 09/05/22   [name], MD  meloxicam (MOBIC) 7.5 MG tablet Take 1 tablet (7.5 mg total) by mouth daily. 10/17/22   [name], MD  metFORMIN (GLUCOPHAGE) 500 MG tablet Take 2 tablets (1,000 mg total) by mouth 2 (two) times daily. 09/05/22   [name], MD  oxyCODONE (ROXICODONE) 5 MG immediate release tablet Take 1 tablet (5 mg total) by mouth every 4 (four) hours as needed for severe pain. 09/11/22   Sabas Sous, MD  pantoprazole (PROTONIX) 40 MG tablet Take 1 tablet (40 mg total) by mouth daily. 06/19/22   [name], MD   Social History   Socioeconomic History   Marital status: Married    Spouse name: Not on file   Number of children: Not on file   Years of education: Not on file   Highest education level: Not on file  Occupational History   Not on file  Tobacco Use   Smoking status: Former   Smokeless tobacco: Never  Substance and Sexual Activity   Alcohol use: No   Drug use: No   Sexual activity: Never  Other Topics  Concern   Not on file  Social History Narrative   Not on file   Social Determinants of Health   Financial Resource Strain: Not on file  Food Insecurity: Not on file  Transportation Needs: Not on file  Physical Activity: Not on file  Stress: Not on file  Social Connections: Unknown (11/06/2021)   Received from [facility]   Social Network    Social Network: Not on file  Intimate Partner Violence: Unknown (09/28/2021)   Received from [facility]   HITS    Physically Hurt: Not on file    Insult or Talk Down To: Not on file    Threaten Physical Harm: Not on file    Scream  or Curse: Not on file    Review of Systems  Constitutional:  Negative for fatigue and unexpected weight change.  Eyes:  Negative for visual disturbance.  Respiratory:  Negative for cough, chest tightness and shortness of breath.   Cardiovascular:  Negative for chest pain, palpitations and leg swelling.  Gastrointestinal:  Negative for abdominal pain and blood in stool.  Neurological:  Negative for dizziness, light-headedness and headaches.     Objective:   Vitals:   01/06/23 0853  BP: 128/80  Pulse: (!) 59  Temp: 98 F (36.7 C)  TempSrc: Temporal  SpO2: 99%  Weight: 207 lb (93.9 kg)  Height: 6\' 1"  (1.854 m)     Physical Exam Vitals reviewed.  Constitutional:      Appearance: He is well-developed.  HENT:     Head: Normocephalic and atraumatic.  Neck:     Vascular: No carotid bruit or JVD.  Cardiovascular:     Rate and Rhythm: Normal rate and regular rhythm.     Heart sounds: Normal heart sounds. No murmur heard. Pulmonary:     Effort: Pulmonary effort is normal.     Breath sounds: Normal breath sounds. No rales.  Musculoskeletal:     Right lower leg: No edema.     Left lower leg: No edema.  Skin:    General: Skin is warm and dry.  Neurological:     Mental Status: He is alert and oriented to person, place, and time.  Psychiatric:        Mood and Affect: Mood normal.         Assessment & Plan:  Brendan Gonzales is a 67 y.o. male . Type 2 diabetes mellitus with hyperglycemia, without long-term current use of insulin (HCC)  Left knee pain, unspecified chronicity  Essential hypertension  Hyperlipidemia, unspecified hyperlipidemia type   No orders of the defined types were placed in this encounter.  There are no Patient Instructions on file for this visit.    Signed,   Meredith Staggers, MD [facility], [facility] 01/06/23 9:25 AM

## 2023-01-06 ENCOUNTER — Ambulatory Visit (INDEPENDENT_AMBULATORY_CARE_PROVIDER_SITE_OTHER): Payer: Medicare HMO | Admitting: Family Medicine

## 2023-01-06 VITALS — BP 128/80 | HR 59 | Temp 98.0°F | Ht 73.0 in | Wt 207.0 lb

## 2023-01-06 DIAGNOSIS — I10 Essential (primary) hypertension: Secondary | ICD-10-CM | POA: Diagnosis not present

## 2023-01-06 DIAGNOSIS — Z7984 Long term (current) use of oral hypoglycemic drugs: Secondary | ICD-10-CM | POA: Diagnosis not present

## 2023-01-06 DIAGNOSIS — M25562 Pain in left knee: Secondary | ICD-10-CM

## 2023-01-06 DIAGNOSIS — E1165 Type 2 diabetes mellitus with hyperglycemia: Secondary | ICD-10-CM

## 2023-01-06 DIAGNOSIS — E785 Hyperlipidemia, unspecified: Secondary | ICD-10-CM | POA: Diagnosis not present

## 2023-01-06 LAB — HEMOGLOBIN A1C: Hgb A1c MFr Bld: 7.4 % — ABNORMAL HIGH (ref 4.6–6.5)

## 2023-01-06 LAB — BASIC METABOLIC PANEL
BUN: 10 mg/dL (ref 6–23)
CO2: 30 mEq/L (ref 19–32)
Calcium: 9.2 mg/dL (ref 8.4–10.5)
Chloride: 99 mEq/L (ref 96–112)
Creatinine, Ser: 0.71 mg/dL (ref 0.40–1.50)
GFR: 96.09 mL/min (ref >60.00)
Glucose, Bld: 132 mg/dL — ABNORMAL HIGH (ref 70–99)
Potassium: 3.8 mEq/L (ref 3.5–5.1)
Sodium: 136 mEq/L (ref 135–145)

## 2023-01-06 NOTE — Patient Instructions (Addendum)
I will check levels on labs. I do not think that it will be a problem to get the A1c under 7.5 for ortho. Last levels were there.   Check with you insurance to see if a continuous glucose monitor will be covered, and let me know which one is covered [facility] [place] 2 or 3,  [employer] specific number?). If covered, let me know and I will order one for you.   I do recommend colonoscopy. Let me know when I can refer you. Because of prior polyp, I do not think Cologuard would be an option.   Take care and thanks for coming in today!

## 2023-01-07 ENCOUNTER — Other Ambulatory Visit: Payer: Self-pay | Admitting: Family Medicine

## 2023-01-07 ENCOUNTER — Encounter: Payer: Self-pay | Admitting: Family Medicine

## 2023-01-07 DIAGNOSIS — E1165 Type 2 diabetes mellitus with hyperglycemia: Secondary | ICD-10-CM

## 2023-01-07 MED ORDER — GLIMEPIRIDE 2 MG PO TABS: 2.0000 mg | ORAL_TABLET | Freq: Every day | ORAL | 1 refills | Status: DC

## 2023-01-07 NOTE — Progress Notes (Signed)
 See labs 

## 2023-01-15 DIAGNOSIS — Z7689 Persons encountering health services in other specified circumstances: Secondary | ICD-10-CM | POA: Diagnosis not present

## 2023-01-15 DIAGNOSIS — M1712 Unilateral primary osteoarthritis, left knee: Secondary | ICD-10-CM | POA: Diagnosis not present

## 2023-01-21 ENCOUNTER — Telehealth: Payer: Self-pay | Admitting: Family Medicine

## 2023-01-21 NOTE — Telephone Encounter (Signed)
Received forms from [facility] Printed & placed in provider bin

## 2023-01-22 ENCOUNTER — Ambulatory Visit: Payer: Medicare HMO | Admitting: Family Medicine

## 2023-01-22 NOTE — Telephone Encounter (Signed)
Please schedule preop eval appointment. I will keep form in my office until that appointment.

## 2023-01-22 NOTE — Telephone Encounter (Signed)
Placed in folder at nurse station

## 2023-01-23 ENCOUNTER — Encounter: Payer: Self-pay | Admitting: Family Medicine

## 2023-01-23 NOTE — Telephone Encounter (Signed)
Appointment made for Monday

## 2023-01-27 ENCOUNTER — Ambulatory Visit (INDEPENDENT_AMBULATORY_CARE_PROVIDER_SITE_OTHER): Payer: Medicare HMO | Admitting: Family Medicine

## 2023-01-27 ENCOUNTER — Encounter: Payer: Self-pay | Admitting: Family Medicine

## 2023-01-27 VITALS — BP 136/74 | HR 57 | Temp 98.0°F | Ht 73.0 in | Wt 206.2 lb

## 2023-01-27 DIAGNOSIS — M1712 Unilateral primary osteoarthritis, left knee: Secondary | ICD-10-CM | POA: Diagnosis not present

## 2023-01-27 DIAGNOSIS — Z01818 Encounter for other preprocedural examination: Secondary | ICD-10-CM

## 2023-01-27 DIAGNOSIS — D649 Anemia, unspecified: Secondary | ICD-10-CM

## 2023-01-27 LAB — CBC
HCT: 34.5 % — ABNORMAL LOW (ref 39.0–52.0)
Hemoglobin: 11.1 g/dL — ABNORMAL LOW (ref 13.0–17.0)
MCHC: 32.1 g/dL (ref 30.0–36.0)
MCV: 79.2 fl (ref 78.0–100.0)
Platelets: 292 10*3/uL (ref 150.0–400.0)
RBC: 4.36 Mil/uL (ref 4.22–5.81)
RDW: 15.9 % — ABNORMAL HIGH (ref 11.5–15.5)
WBC: 5.8 10*3/uL (ref 4.0–10.5)

## 2023-01-27 NOTE — Patient Instructions (Signed)
Thank you for coming in today.  I do not see any concerns or significant changes on your EKG.  Recent electrolytes overall looked okay.  I will check your blood counts, then once I review those results can complete the paperwork for your surgeon.  Good luck with the surgery and let me know if there are any questions.  Hang in there.

## 2023-01-27 NOTE — Progress Notes (Signed)
Subjective:  Patient ID: Brendan Gonzales, male    DOB: Apr 02, 1956  Age: 67 y.o. MRN: [id number]  CC:  Chief Complaint  Patient presents with   Medical Clearance    Pt does not see cardiology, notes no concerns, he is having Knee replacement Lt 03/25/23     HPI Brendan Gonzales presents for   Preoperative evaluation: Form received from his orthopedist, Dr. Charlann Boxer, plan for left total knee arthroplasty. Planned surgery 03/25/23.  Anticipated spinal anesthesia.  Will have PAT appointment with labs per anesthesia protocol 10 to 14 days prior to surgery  - additional labs if needed.  Intermediate risk surgery with knee surgery. History of type 2 diabetes, hypertension, hyperlipidemia, mild anemia at 11.1 in March - stable from prior readings.  No iron supplement. No melena/hematochezia.  Echo by cardiology in January 2021 with EF 66%, mild concentric hypertrophy of the left ventricle but normal global wall motion.  Grade 1 diastolic dysfunction, mild aortic regurgitation.  No hx of OSA.   RCRI/Goldman index score of 0 without history of CVA/TIA, CHF, ischemic cardiac disease, renal insufficiency, no insulin for his diabetes.   He is able to walk up a hill or heavy housework without chest pain or dyspnea, at least 4-6 METS. Lab Results  Component Value Date   WBC 6.1 09/11/2022   HGB 11.1 (L) 09/11/2022   HCT 34.1 (L) 09/11/2022   MCV 78.0 (L) 09/11/2022   PLT 258 09/11/2022   Lab Results  Component Value Date   HGBA1C 7.4 (H) 01/06/2023   Lab Results  Component Value Date   NA 136 01/06/2023   K 3.8 01/06/2023   CL 99 01/06/2023   CO2 30 01/06/2023       History Patient Active Problem List   Diagnosis Date Noted   Osteoarthritis of left knee 12/29/2022   Derangement of left knee 12/29/2022   Hyperlipidemia 09/05/2022   Hospice care patient 07/06/2019   Eczema of hand 10/19/2014   Type 2 diabetes mellitus (HCC) 06/13/2013   Essential hypertension 06/13/2013   Normal coronary  arteries- 2008 06/13/2013   Chest pain with moderate risk of acute coronary syndrome 06/12/2013   Special screening for malignant neoplasms, colon 06/27/2011   Arthritis of knee 06/14/2011   Past Medical History:  Diagnosis Date   Arthritis    Constipation    Diabetes mellitus    type 2   Dizziness    Headache(784.0)    Hyperlipidemia    Hypertension    benign   Past Surgical History:  Procedure Laterality Date   COLONOSCOPY     CORONARY ANGIOPLASTY     HEMORRHOID SURGERY     right femeroral bypass     right knee surgery     Allergies  Allergen Reactions   Metoprolol Diarrhea   Prior to Admission medications   Medication Sig Start Date End Date Taking? Authorizing Provider  acetaminophen (TYLENOL) 500 MG tablet Take 500 mg by mouth 3 (three) times daily as needed for mild pain.    Yes [provider]  amLODipine (NORVASC) 5 MG tablet Take 1 tablet by mouth once daily 12/30/22  Yes [name], MD  aspirin 81 MG tablet Take 81 mg by mouth every morning.   Yes [provider]  atorvastatin (LIPITOR) 10 MG tablet Take 1 tablet (10 mg total) by mouth daily. 09/05/22  Yes [name], MD  cyclobenzaprine (FLEXERIL) 5 MG tablet Take 1 tablet (5 mg total) by mouth 3 (three)  times daily as needed for muscle spasms (start qhs prn due to sedation). 09/23/22  Yes [name], MD  glimepiride (AMARYL) 2 MG tablet Take 1 tablet (2 mg total) by mouth daily with breakfast. 01/07/23  Yes [name], MD  ibuprofen (ADVIL) 600 MG tablet Take 1 tablet (600 mg total) by mouth every 6 (six) hours as needed. 08/08/22  Yes Domenick Gong, MD  lisinopril-hydrochlorothiazide (ZESTORETIC) 20-12.5 MG tablet Take 2 tablets by mouth daily. 09/05/22  Yes [name], MD  meloxicam (MOBIC) 7.5 MG tablet Take 1 tablet (7.5 mg total) by mouth daily. 10/17/22  Yes [name], MD  metFORMIN (GLUCOPHAGE) 500 MG tablet Take 2 tablets (1,000 mg total) by mouth 2  (two) times daily. 09/05/22  Yes [name], MD  oxyCODONE (ROXICODONE) 5 MG immediate release tablet Take 1 tablet (5 mg total) by mouth every 4 (four) hours as needed for severe pain. 09/11/22  Yes Sabas Sous, MD  pantoprazole (PROTONIX) 40 MG tablet Take 1 tablet (40 mg total) by mouth daily. 06/19/22  Yes [name], MD   Social History   Socioeconomic History   Marital status: Married    Spouse name: Not on file   Number of children: Not on file   Years of education: Not on file   Highest education level: Not on file  Occupational History   Not on file  Tobacco Use   Smoking status: Former   Smokeless tobacco: Never  Substance and Sexual Activity   Alcohol use: No   Drug use: No   Sexual activity: Never  Other Topics Concern   Not on file  Social History Narrative   Not on file   Social Determinants of Health   Financial Resource Strain: Not on file  Food Insecurity: Not on file  Transportation Needs: Not on file  Physical Activity: Not on file  Stress: Not on file  Social Connections: Unknown (11/06/2021)   Received from [facility]   Social Network    Social Network: Not on file  Intimate Partner Violence: Unknown (09/28/2021)   Received from [facility]   HITS    Physically Hurt: Not on file    Insult or Talk Down To: Not on file    Threaten Physical Harm: Not on file    Scream or Curse: Not on file    Review of Systems   Objective:   Vitals:   01/27/23 0905  BP: 136/74  Pulse: (!) 57  Temp: 98 F (36.7 C)  TempSrc: Temporal  SpO2: 98%  Weight: 206 lb 3.2 oz (93.5 kg)  Height: 6\' 1"  (1.854 m)     Physical Exam Vitals reviewed.  Constitutional:      Appearance: He is well-developed.  HENT:     Head: Normocephalic and atraumatic.  Neck:     Vascular: No carotid bruit or JVD.  Cardiovascular:     Rate and Rhythm: Normal rate and regular rhythm.     Heart sounds: Normal heart sounds. No murmur heard. Pulmonary:      Effort: Pulmonary effort is normal.     Breath sounds: Normal breath sounds. No rales.  Musculoskeletal:     Right lower leg: No edema.     Left lower leg: No edema.  Skin:    General: Skin is warm and dry.  Neurological:     Mental Status: He is alert and oriented to person, place, and time.  Psychiatric:  Mood and Affect: Mood normal.     EKG sinus rhythm, bradycardia with rate of 58.  No acute ST or T wave changes appreciated, no significant changes compared to 07/07/2019 EKG. Assessment & Plan:  Brendan Gonzales is a 67 y.o. male . Preoperative evaluation to rule out surgical contraindication - Plan: EKG 12-Lead  Osteoarthritis of left knee, unspecified osteoarthritis type  Anemia, unspecified type - Plan: CBC Planned intermediate risk surgery with left total knee replacement, spinal anesthesia planned as above.  No apparent increased risk of major adverse cardiac event and appears to be at acceptable risk for that surgery.  He has had a borderline anemia although stable on previous labs.  Check updated CBC, and if stable will complete paperwork for surgeon.  Recent A1c and electrolytes in July.  No orders of the defined types were placed in this encounter.  Patient Instructions  Thank you for coming in today.  I do not see any concerns or significant changes on your EKG.  Recent electrolytes overall looked okay.  I will check your blood counts, then once I review those results can complete the paperwork for your surgeon.  Good luck with the surgery and let me know if there are any questions.  Hang in there.    Signed,   Meredith Staggers, MD [facility], [facility] 01/27/23 9:40 AM

## 2023-01-30 NOTE — Telephone Encounter (Signed)
Paperwork completed and placed in fax bin at back nurse station

## 2023-02-22 ENCOUNTER — Ambulatory Visit (HOSPITAL_COMMUNITY)
Admission: EM | Admit: 2023-02-22 | Discharge: 2023-02-22 | Disposition: A | Discharge status: Home / Self Care | Payer: Medicare HMO | Attending: Emergency Medicine | Admitting: Emergency Medicine

## 2023-02-22 ENCOUNTER — Encounter (HOSPITAL_COMMUNITY): Payer: Self-pay | Admitting: Emergency Medicine

## 2023-02-22 DIAGNOSIS — H6121 Impacted cerumen, right ear: Secondary | ICD-10-CM | POA: Diagnosis not present

## 2023-02-22 DIAGNOSIS — H60391 Other infective otitis externa, right ear: Secondary | ICD-10-CM

## 2023-02-22 MED ORDER — OFLOXACIN 0.3 % OT SOLN: 10.0000 [drp] | Freq: Every day | OTIC | 0 refills | Status: AC

## 2023-02-22 MED ORDER — MUPIROCIN 2 % EX OINT: 1.0000 | TOPICAL_OINTMENT | Freq: Two times a day (BID) | CUTANEOUS | 0 refills | Status: DC

## 2023-02-22 NOTE — ED Provider Notes (Signed)
[facility]    CSN: [id number] Arrival date & time: 02/22/23  1117      History   Chief Complaint Chief Complaint  Patient presents with   Otalgia    HPI Brendan Gonzales is a 67 y.o. male.   Patient presents to clinic complaining of bilateral ear pain and discomfort.  He has noted some drainage from his right ear with a bad odor.  He does have spots to the top of his auricle that have been present for years that have been recently expressing some foul-smelling drainage as well.  He has been taking Tylenol around-the-clock as he recently had a knee replacement.  Reports that this has not been helping with the ear pain.    The history is provided by the patient and medical records.  Otalgia Associated symptoms: ear discharge   Associated symptoms: no fever     Past Medical History:  Diagnosis Date   Arthritis    Constipation    Diabetes mellitus    type 2   Dizziness    Headache(784.0)    Hyperlipidemia    Hypertension    benign    Patient Active Problem List   Diagnosis Date Noted   Osteoarthritis of left knee 12/29/2022   Derangement of left knee 12/29/2022   Hyperlipidemia 09/05/2022   Hospice care patient 07/06/2019   Eczema of hand 10/19/2014   Type 2 diabetes mellitus (HCC) 06/13/2013   Essential hypertension 06/13/2013   Normal coronary arteries- 2008 06/13/2013   Chest pain with moderate risk of acute coronary syndrome 06/12/2013   Special screening for malignant neoplasms, colon 06/27/2011   Arthritis of knee 06/14/2011    Past Surgical History:  Procedure Laterality Date   COLONOSCOPY     CORONARY ANGIOPLASTY     HEMORRHOID SURGERY     right femeroral bypass     right knee surgery         Home Medications    Prior to Admission medications   Medication Sig Start Date End Date Taking? Authorizing Provider  mupirocin ointment (BACTROBAN) 2 % Apply 1 Application topically 2 (two) times daily. 02/22/23  Yes Rinaldo Ratel, [place] N, FNP   ofloxacin (FLOXIN) 0.3 % OTIC solution Place 10 drops into both ears daily for 7 days. 02/22/23 03/01/23 Yes Rinaldo Ratel, [place] N, FNP  acetaminophen (TYLENOL) 500 MG tablet Take 500 mg by mouth 3 (three) times daily as needed for mild pain.     [provider]  amLODipine (NORVASC) 5 MG tablet Take 1 tablet by mouth once daily 12/30/22   [name], MD  aspirin 81 MG tablet Take 81 mg by mouth every morning.    [provider]  atorvastatin (LIPITOR) 10 MG tablet Take 1 tablet (10 mg total) by mouth daily. 09/05/22   [name], MD  cyclobenzaprine (FLEXERIL) 5 MG tablet Take 1 tablet (5 mg total) by mouth 3 (three) times daily as needed for muscle spasms (start qhs prn due to sedation). 09/23/22   [name], MD  glimepiride (AMARYL) 2 MG tablet Take 1 tablet (2 mg total) by mouth daily with breakfast. 01/07/23   [name], MD  ibuprofen (ADVIL) 600 MG tablet Take 1 tablet (600 mg total) by mouth every 6 (six) hours as needed. 08/08/22   Domenick Gong, MD  lisinopril-hydrochlorothiazide (ZESTORETIC) 20-12.5 MG tablet Take 2 tablets by mouth daily. 09/05/22   [name], MD  meloxicam (MOBIC) 7.5 MG tablet Take 1 tablet (7.5  mg total) by mouth daily. 10/17/22   [name], MD  metFORMIN (GLUCOPHAGE) 500 MG tablet Take 2 tablets (1,000 mg total) by mouth 2 (two) times daily. 09/05/22   [name], MD  oxyCODONE (ROXICODONE) 5 MG immediate release tablet Take 1 tablet (5 mg total) by mouth every 4 (four) hours as needed for severe pain. 09/11/22   Sabas Sous, MD  pantoprazole (PROTONIX) 40 MG tablet Take 1 tablet (40 mg total) by mouth daily. 06/19/22   [name], MD    Family History Family History  Problem Relation Age of Onset   Diabetes Other    Hypertension Other    Anesthesia problems Neg Hx    Hypotension Neg Hx    Malignant hyperthermia Neg Hx    Pseudochol deficiency Neg Hx     Social History Social History    Tobacco Use   Smoking status: Former   Smokeless tobacco: Never  Substance Use Topics   Alcohol use: No   Drug use: No     Allergies   Metoprolol   Review of Systems Review of Systems  Constitutional:  Negative for fever.  HENT:  Positive for ear discharge and ear pain.      Physical Exam Triage Vital Signs ED Triage Vitals [02/22/23 1148]  Encounter Vitals Group     BP (!) 150/76     Systolic BP Percentile      Diastolic BP Percentile      Pulse Rate 61     Resp 18     Temp 97.9 F (36.6 C)     Temp Source Oral     SpO2 97 %     Weight      Height      Head Circumference      Peak Flow      Pain Score 6     Pain Loc      Pain Education      Exclude from Growth Chart    No data found.  Updated Vital Signs BP (!) 150/76 (BP Location: Left Arm)   Pulse 61   Temp 97.9 F (36.6 C) (Oral)   Resp 18   SpO2 97%   Visual Acuity Right Eye Distance:   Left Eye Distance:   Bilateral Distance:    Right Eye Near:   Left Eye Near:    Bilateral Near:     Physical Exam Vitals and nursing note reviewed.  Constitutional:      Appearance: Normal appearance.  HENT:     Head: Normocephalic and atraumatic.     Right Ear: Tympanic membrane normal. There is impacted cerumen.     Left Ear: Tympanic membrane normal.     Ears:      Comments: Right EAC w/ impacted cerumen, left ear with moderate cerumen. TM intact and pearly gray bilaterally.   Does have old circular wounds with some crusting, appear to be similar to old piercing holes.      Nose: Nose normal.     Mouth/Throat:     Mouth: Mucous membranes are moist.  Eyes:     Conjunctiva/sclera: Conjunctivae normal.  Cardiovascular:     Rate and Rhythm: Normal rate.  Pulmonary:     Effort: Pulmonary effort is normal. No respiratory distress.  Neurological:     General: No focal deficit present.     Mental Status: He is alert.  Psychiatric:        Mood and Affect: Mood normal.  Behavior: Behavior  is cooperative.      UC Treatments / Results  Labs (all labs ordered are listed, but only abnormal results are displayed) Labs Reviewed - No data to display  EKG   Radiology No results found.  Procedures Procedures (including critical care time)  Medications Ordered in UC Medications - No data to display  Initial Impression / Assessment and Plan / UC Course  I have reviewed the triage vital signs and the nursing notes.  Pertinent labs & imaging results that were available during my care of the patient were reviewed by me and considered in my medical decision making (see chart for details).  Vitals and triage reviewed, patient is hemodynamically stable.  Right ear with cerumen impaction, left ear with moderate cerumen.  Staff performed irrigation.  After irrigation external ear canal is erythematous, will cover for otitis externa with ofloxacin.  Circular areas to top of auricle that are crusted, appear to be old piercings.  Will trial topical mupirocin ointment as right ear has dried discharge. No erythema, swelling or tenderness to sites. Topical mupirocin ointment to the sites.   Plan of care, follow-up care and return precautions given, no questions at this time.     Final Clinical Impressions(s) / UC Diagnoses   Final diagnoses:  Impacted cerumen of right ear  Infective otitis externa of right ear     Discharge Instructions      We cleaned your ears from wax bilaterally.  Please use the antibiotic drops daily for the next 7 days.  Please keep your ears clean and dry, do not get them wet or submerge them in water.  While showering you can use a Vaseline soaked cotton ball to avoid water entering the canal.  If you have pain you can take 500 mg of Tylenol as needed.  Return to clinic or follow-up with your primary care if you have no improvement despite using antibiotics for 72 hours.  Return to clinic for new or urgent symptoms.     ED Prescriptions      Medication Sig Dispense Auth. Provider   mupirocin ointment (BACTROBAN) 2 % Apply 1 Application topically 2 (two) times daily. 22 g Rinaldo Ratel, [place] N, [place]   ofloxacin (FLOXIN) 0.3 % OTIC solution Place 10 drops into both ears daily for 7 days. 10 mL Huda Petrey, [place] N, FNP      PDMP not reviewed this encounter.   Carol Loftin, [place] N, [place] 02/22/23 1240

## 2023-02-22 NOTE — Discharge Instructions (Addendum)
We cleaned your ears from wax bilaterally.  Please use the antibiotic drops daily for the next 7 days.  Please keep your ears clean and dry, do not get them wet or submerge them in water.  While showering you can use a Vaseline soaked cotton ball to avoid water entering the canal.  If you have pain you can take 500 mg of Tylenol as needed.  Return to clinic or follow-up with your primary care if you have no improvement despite using antibiotics for 72 hours.  Return to clinic for new or urgent symptoms.

## 2023-02-22 NOTE — ED Triage Notes (Signed)
Pt c/o bilateral ear pain for 3-4 days. Pt took Tylenol every 4 hours

## 2023-03-03 DIAGNOSIS — M25562 Pain in left knee: Secondary | ICD-10-CM | POA: Diagnosis not present

## 2023-03-03 DIAGNOSIS — M25662 Stiffness of left knee, not elsewhere classified: Secondary | ICD-10-CM | POA: Diagnosis not present

## 2023-03-03 DIAGNOSIS — M1712 Unilateral primary osteoarthritis, left knee: Secondary | ICD-10-CM | POA: Diagnosis not present

## 2023-03-11 NOTE — Progress Notes (Signed)
COVID Vaccine received:  []  No [x]  Yes Date of any COVID positive Test in last 90 days: no PCP - Meredith Staggers MD Cardiologist - no  Medical clearance 01/27/23-Jeffrey Neva Seat MD  Chest x-ray no-  EKG -   Stress Test - 11/15/15 Epic ECHO - 07/12/19 Epic Cardiac Cath -   Bowel Prep - [x]  No  []   Yes ______  Pacemaker / ICD device [x]  No []  Yes   Spinal Cord Stimulator:[x]  No []  Yes       History of Sleep Apnea? [x]  No []  Yes   CPAP used?- [x]  No []  Yes    Does the patient monitor blood sugar?          []  No [x]  Yes  []  N/A  Patient has: []  NO Hx DM   []  Pre-DM                 []  DM1  [x]   DM2 Does patient have a [employer] or Dexacom? [x]  No []  Yes   Fasting Blood Sugar Ranges- 118 Checks Blood Sugar __1___ times a day  GLP1 agonist / usual dose - no GLP1 instructions:  SGLT-2 inhibitors / usual dose -no SGLT-2 instructions:   Blood Thinner / Instructions:no Aspirin Instructions:no  Comments:   Activity level: Patient is able to climb a flight of stairs with difficulty; [x]  No CP  [x]  No SOB, would have _knee pain__   Patient can perform ADLs without assistance.   Anesthesia review:   Patient denies shortness of breath, fever, cough and chest pain at PAT appointment.  Patient verbalized understanding and agreement to the Pre-Surgical Instructions that were given to them at this PAT appointment. Patient was also educated of the need to review these PAT instructions again prior to his/her surgery.I reviewed the appropriate phone numbers to call if they have any and questions or concerns.

## 2023-03-11 NOTE — Patient Instructions (Addendum)
SURGICAL WAITING ROOM VISITATION  Patients having surgery or a procedure may have no more than 2 support people in the waiting area - these visitors may rotate.    Children under the age of 75 must have an adult with them who is not the patient.  Due to an increase in RSV and influenza rates and associated hospitalizations, children ages 71 and under may not visit patients in [facility] hospitals.  If the patient needs to stay at the hospital during part of their recovery, the visitor guidelines for inpatient rooms apply. Pre-op nurse will coordinate an appropriate time for 1 support person to accompany patient in pre-op.  This support person may not rotate.    Please refer to the [facility] website for the visitor guidelines for Inpatients (after your surgery is over and you are in a regular room).       Your procedure is scheduled on: 03/25/23   Report to [facility] Main Entrance    Report to admitting at  10:30 AM   Call this number if you have problems the morning of surgery [phone]   Do not eat food :After Midnight.   After Midnight you may have the following liquids until 9:55 AM  DAY OF SURGERY  Water Non-Citrus Juices (without pulp, NO RED-Apple, White grape, White cranberry) Black Coffee (NO MILK/CREAM OR CREAMERS, sugar ok)  Clear Tea (NO MILK/CREAM OR CREAMERS, sugar ok) regular and decaf                             Plain Jell-O (NO RED)                                           Fruit ices (not with fruit pulp, NO RED)                                     Popsicles (NO RED)                                                               Sports drinks like Gatorade (NO RED)                  The day of surgery:  Drink ONE (1) Pre-Surgery G2 at  9:55 AM the morning of surgery. Drink in one sitting. Do not sip.  This drink was given to you during your hospital  pre-op appointment visit. Nothing else to drink after completing the  Pre-Surgery  G2.   Oral  Hygiene is also important to reduce your risk of infection.                                    Remember - BRUSH YOUR TEETH THE MORNING OF SURGERY WITH YOUR REGULAR TOOTHPASTE  DENTURES WILL BE REMOVED PRIOR TO SURGERY PLEASE DO NOT APPLY "Poly grip" OR ADHESIVES!!!   Stop all vitamins and herbal supplements 7 days before surgery.   Take these medicines the  morning of surgery with A SIP OF WATER:   DO NOT TAKE ANY ORAL DIABETIC MEDICATIONS DAY OF YOUR SURGERY HOLD GLIMPIRIDE THE DAY OF SURGERY. HOLD METFORMIN THE DAY OF SURGERY.             You may not have any metal on your body including hair pins, jewelry, and body piercing             Do not wear lotions, powders, cologne, or deodorant                Men may shave face and neck.   Do not bring valuables to the hospital. [facility] IS NOT             RESPONSIBLE   FOR VALUABLES.   Contacts, glasses, dentures or bridgework may not be worn into surgery.   Bring small overnight bag day of surgery.   DO NOT BRING YOUR HOME MEDICATIONS TO THE HOSPITAL. PHARMACY WILL DISPENSE MEDICATIONS LISTED ON YOUR MEDICATION LIST TO YOU DURING YOUR ADMISSION IN THE HOSPITAL!    Patients discharged on the day of surgery will not be allowed to drive home.  Someone NEEDS to stay with you for the first 24 hours after anesthesia.   Special Instructions: Bring a copy of your healthcare power of attorney and living will documents the day of surgery if you haven't scanned them before.              Please read over the following fact sheets you were given: IF YOU HAVE QUESTIONS ABOUT YOUR PRE-OP INSTRUCTIONS PLEASE CALL [phone] [facility]   If you received a COVID test during your pre-op visit  it is requested that you wear a mask when out in public, stay away from anyone that may not be feeling well and notify your surgeon if you develop symptoms. If you test positive for Covid or have been in contact with anyone that has tested positive in the last  10 days please notify you surgeon.      Pre-operative 5 CHG Bath Instructions   You can play a key role in reducing the risk of infection after surgery. Your skin needs to be as free of germs as possible. You can reduce the number of germs on your skin by washing with CHG (chlorhexidine gluconate) soap before surgery. CHG is an antiseptic soap that kills germs and continues to kill germs even after washing.   DO NOT use if you have an allergy to chlorhexidine/CHG or antibacterial soaps. If your skin becomes reddened or irritated, stop using the CHG and notify one of our RNs at [phone].   Please shower with the CHG soap starting 4 days before surgery using the following schedule:     Please keep in mind the following:  DO NOT shave, including legs and underarms, starting the day of your first shower.   You may shave your face at any point before/day of surgery.  Place clean sheets on your bed the day you start using CHG soap. Use a clean washcloth (not used since being washed) for each shower. DO NOT sleep with pets once you start using the CHG.   CHG Shower Instructions:  If you choose to wash your hair and private area, wash first with your normal shampoo/soap.  After you use shampoo/soap, rinse your hair and body thoroughly to remove shampoo/soap residue.  Turn the water OFF and apply about 3 tablespoons (45 ml) of CHG soap to a CLEAN washcloth.  Apply CHG soap ONLY FROM YOUR NECK DOWN TO YOUR TOES (washing for 3-5 minutes)  DO NOT use CHG soap on face, private areas, open wounds, or sores.  Pay special attention to the area where your surgery is being performed.  If you are having back surgery, having someone wash your back for you may be helpful. Wait 2 minutes after CHG soap is applied, then you may rinse off the CHG soap.  Pat dry with a clean towel  Put on clean clothes/pajamas   If you choose to wear lotion, please use ONLY the CHG-compatible lotions on the back of this  paper.     Additional instructions for the day of surgery: DO NOT APPLY any lotions, deodorants, cologne, or perfumes.   Put on clean/comfortable clothes.  Brush your teeth.  Ask your nurse before applying any prescription medications to the skin.      CHG Compatible Lotions   Aveeno Moisturizing lotion  Cetaphil Moisturizing Cream  Cetaphil Moisturizing Lotion  Clairol Herbal Essence Moisturizing Lotion, Dry Skin  Clairol Herbal Essence Moisturizing Lotion, Extra Dry Skin  Clairol Herbal Essence Moisturizing Lotion, Normal Skin  Curel Age Defying Therapeutic Moisturizing Lotion with Alpha Hydroxy  Curel Extreme Care Body Lotion  Curel Soothing Hands Moisturizing Hand Lotion  Curel Therapeutic Moisturizing Cream, Fragrance-Free  Curel Therapeutic Moisturizing Lotion, Fragrance-Free  Curel Therapeutic Moisturizing Lotion, Original Formula  Eucerin Daily Replenishing Lotion  Eucerin Dry Skin Therapy Plus Alpha Hydroxy Crme  Eucerin Dry Skin Therapy Plus Alpha Hydroxy Lotion  Eucerin Original Crme  Eucerin Original Lotion  Eucerin Plus Crme Eucerin Plus Lotion  Eucerin TriLipid Replenishing Lotion  Keri Anti-Bacterial Hand Lotion  Keri Deep Conditioning Original Lotion Dry Skin Formula Softly Scented  Keri Deep Conditioning Original Lotion, Fragrance Free Sensitive Skin Formula  Keri Lotion Fast Absorbing Fragrance Free Sensitive Skin Formula  Keri Lotion Fast Absorbing Softly Scented Dry Skin Formula  Keri Original Lotion  Keri Skin Renewal Lotion Keri Silky Smooth Lotion  Keri Silky Smooth Sensitive Skin Lotion  Nivea Body Creamy Conditioning Oil  Nivea Body Extra Enriched Lotion  Nivea Body Original Lotion  Nivea Body Sheer Moisturizing Lotion Nivea Crme  Nivea Skin Firming Lotion  NutraDerm 30 Skin Lotion  NutraDerm Skin Lotion  NutraDerm Therapeutic Skin Cream  NutraDerm Therapeutic Skin Lotion  ProShield Protective Hand Cream    Incentive Spirometer An  incentive spirometer is a tool that can help keep your lungs clear and active. This tool measures how well you are filling your lungs with each breath. Taking long deep breaths may help reverse or decrease the chance of developing breathing (pulmonary) problems (especially infection) following: A long period of time when you are unable to move or be active. BEFORE THE PROCEDURE  If the spirometer includes an indicator to show your best effort, your nurse or respiratory therapist will set it to a desired goal. If possible, sit up straight or lean slightly forward. Try not to slouch. Hold the incentive spirometer in an upright position. INSTRUCTIONS FOR USE  Sit on the edge of your bed if possible, or sit up as far as you can in bed or on a chair. Hold the incentive spirometer in an upright position. Breathe out normally. Place the mouthpiece in your mouth and seal your lips tightly around it. Breathe in slowly and as deeply as possible, raising the piston or the ball toward the top of the column. Hold your breath for 3-5 seconds or for as long as possible.  Allow the piston or ball to fall to the bottom of the column. Remove the mouthpiece from your mouth and breathe out normally. Rest for a few seconds and repeat Steps 1 through 7 at least 10 times every 1-2 hours when you are awake. Take your time and take a few normal breaths between deep breaths. The spirometer may include an indicator to show your best effort. Use the indicator as a goal to work toward during each repetition. After each set of 10 deep breaths, practice coughing to be sure your lungs are clear. If you have an incision (the cut made at the time of surgery), support your incision when coughing by placing a pillow or rolled up towels firmly against it. Once you are able to get out of bed, walk around indoors and cough well. You may stop using the incentive spirometer when instructed by your caregiver.  RISKS AND COMPLICATIONS Take  your time so you do not get dizzy or light-headed. If you are in pain, you may need to take or ask for pain medication before doing incentive spirometry. It is harder to take a deep breath if you are having pain. AFTER USE Rest and breathe slowly and easily. It can be helpful to keep track of a log of your progress. Your caregiver can provide you with a simple table to help with this. If you are using the spirometer at home, follow these instructions: SEEK MEDICAL CARE IF:  You are having difficultly using the spirometer. You have trouble using the spirometer as often as instructed. Your pain medication is not giving enough relief while using the spirometer. You develop fever of 100.5 F (38.1 C) or higher. SEEK IMMEDIATE MEDICAL CARE IF:  You cough up bloody sputum that had not been present before. You develop fever of 102 F (38.9 C) or greater. You develop worsening pain at or near the incision site. MAKE SURE YOU:  Understand these instructions. Will watch your condition. Will get help right away if you are not doing well or get worse. Document Released: 10/21/2006 Document Revised: 09/02/2011 Document Reviewed: 12/22/2006 [facility] Patient Information 2014 [place], [place].

## 2023-03-12 ENCOUNTER — Encounter (HOSPITAL_COMMUNITY)
Admission: RE | Admit: 2023-03-12 | Discharge: 2023-03-12 | Disposition: A | Discharge status: Home / Self Care | Payer: Medicare HMO | Source: Ambulatory Visit | Attending: Orthopedic Surgery | Admitting: Orthopedic Surgery

## 2023-03-12 ENCOUNTER — Other Ambulatory Visit: Payer: Self-pay

## 2023-03-12 ENCOUNTER — Encounter (HOSPITAL_COMMUNITY): Payer: Self-pay

## 2023-03-12 VITALS — BP 153/82 | HR 61 | Temp 97.9°F | Resp 16 | Ht 73.0 in | Wt 202.0 lb

## 2023-03-12 DIAGNOSIS — I10 Essential (primary) hypertension: Secondary | ICD-10-CM | POA: Insufficient documentation

## 2023-03-12 DIAGNOSIS — E119 Type 2 diabetes mellitus without complications: Secondary | ICD-10-CM | POA: Insufficient documentation

## 2023-03-12 DIAGNOSIS — R58 Hemorrhage, not elsewhere classified: Secondary | ICD-10-CM

## 2023-03-12 DIAGNOSIS — M25662 Stiffness of left knee, not elsewhere classified: Secondary | ICD-10-CM | POA: Insufficient documentation

## 2023-03-12 DIAGNOSIS — Z01818 Encounter for other preprocedural examination: Secondary | ICD-10-CM | POA: Diagnosis not present

## 2023-03-12 DIAGNOSIS — Z01812 Encounter for preprocedural laboratory examination: Secondary | ICD-10-CM | POA: Insufficient documentation

## 2023-03-12 LAB — BASIC METABOLIC PANEL WITH GFR
Anion gap: 11 (ref 5–15)
BUN: 10 mg/dL (ref 8–23)
CO2: 26 mmol/L (ref 22–32)
Calcium: 8.9 mg/dL (ref 8.9–10.3)
Chloride: 100 mmol/L (ref 98–111)
Creatinine, Ser: 0.72 mg/dL (ref 0.61–1.24)
GFR, Estimated: >60 mL/min (ref >60)
Glucose, Bld: 147 mg/dL — ABNORMAL HIGH (ref 70–99)
Potassium: 3.7 mmol/L (ref 3.5–5.1)
Sodium: 137 mmol/L (ref 135–145)

## 2023-03-12 LAB — CBC
HCT: 34.5 % — ABNORMAL LOW (ref 39.0–52.0)
Hemoglobin: 10.8 g/dL — ABNORMAL LOW (ref 13.0–17.0)
MCH: 25.1 pg — ABNORMAL LOW (ref 26.0–34.0)
MCHC: 31.3 g/dL (ref 30.0–36.0)
MCV: 80.2 fL (ref 80.0–100.0)
Platelets: 256 10*3/uL (ref 150–400)
RBC: 4.3 MIL/uL (ref 4.22–5.81)
RDW: 15.6 % — ABNORMAL HIGH (ref 11.5–15.5)
WBC: 5.1 10*3/uL (ref 4.0–10.5)
nRBC: 0 % (ref 0.0–0.2)

## 2023-03-12 LAB — SURGICAL PCR SCREEN
MRSA, PCR: NEGATIVE
Staphylococcus aureus: NEGATIVE

## 2023-03-12 LAB — GLUCOSE, CAPILLARY: Glucose-Capillary: 148 mg/dL — ABNORMAL HIGH (ref 70–99)

## 2023-03-13 LAB — HEMOGLOBIN A1C
Hgb A1c MFr Bld: 7.9 % — ABNORMAL HIGH (ref 4.8–5.6)
Mean Plasma Glucose: 180 mg/dL

## 2023-03-19 ENCOUNTER — Ambulatory Visit: Payer: Medicare HMO

## 2023-03-19 DIAGNOSIS — Z0000 Encounter for general adult medical examination without abnormal findings: Secondary | ICD-10-CM | POA: Diagnosis not present

## 2023-03-19 NOTE — Patient Instructions (Signed)
Mr. Brendan Gonzales , Thank you for taking time to come for your Medicare Wellness Visit. I appreciate your ongoing commitment to your health goals. Please review the following plan we discussed and let me know if I can assist you in the future.   Screening recommendations/referrals: Colonoscopy: Education provided Recommended yearly ophthalmology/optometry visit for glaucoma screening and checkup Recommended yearly dental visit for hygiene and checkup  Vaccinations: Influenza vaccine: Education provided Pneumococcal vaccine: up to date Tdap vaccine: up to date Shingles vaccine: Education provided    Advanced directives: Education provided    Preventive Care 65 Years and Older, Male Preventive care refers to lifestyle choices and visits with your health care provider that can promote health and wellness. What does preventive care include? A yearly physical exam. This is also called an annual well check. Dental exams once or twice a year. Routine eye exams. Ask your health care provider how often you should have your eyes checked. Personal lifestyle choices, including: Daily care of your teeth and gums. Regular physical activity. Eating a healthy diet. Avoiding tobacco and drug use. Limiting alcohol use. Practicing safe sex. Taking low doses of aspirin every day. Taking vitamin and mineral supplements as recommended by your health care provider. What happens during an annual well check? The services and screenings done by your health care provider during your annual well check will depend on your age, overall health, lifestyle risk factors, and family history of disease. Counseling  Your health care provider may ask you questions about your: Alcohol use. Tobacco use. Drug use. Emotional well-being. Home and relationship well-being. Sexual activity. Eating habits. History of falls. Memory and ability to understand (cognition). Work and work Astronomer. Screening  You may have the  following tests or measurements: Height, weight, and BMI. Blood pressure. Lipid and cholesterol levels. These may be checked every 5 years, or more frequently if you are over 57 years old. Skin check. Lung cancer screening. You may have this screening every year starting at age 16 if you have a 30-pack-year history of smoking and currently smoke or have quit within the past 15 years. Fecal occult blood test (FOBT) of the stool. You may have this test every year starting at age 38. Flexible sigmoidoscopy or colonoscopy. You may have a sigmoidoscopy every 5 years or a colonoscopy every 10 years starting at age 5. Prostate cancer screening. Recommendations will vary depending on your family history and other risks. Hepatitis C blood test. Hepatitis B blood test. Sexually transmitted disease (STD) testing. Diabetes screening. This is done by checking your blood sugar (glucose) after you have not eaten for a while (fasting). You may have this done every 1-3 years. Abdominal aortic aneurysm (AAA) screening. You may need this if you are a current or former smoker. Osteoporosis. You may be screened starting at age 54 if you are at high risk. Talk with your health care provider about your test results, treatment options, and if necessary, the need for more tests. Vaccines  Your health care provider may recommend certain vaccines, such as: Influenza vaccine. This is recommended every year. Tetanus, diphtheria, and acellular pertussis (Tdap, Td) vaccine. You may need a Td booster every 10 years. Zoster vaccine. You may need this after age 61. Pneumococcal 13-valent conjugate (PCV13) vaccine. One dose is recommended after age 70. Pneumococcal polysaccharide (PPSV23) vaccine. One dose is recommended after age 46. Talk to your health care provider about which screenings and vaccines you need and how often you need them. This information is  not intended to replace advice given to you by your health care  provider. Make sure you discuss any questions you have with your health care provider. Document Released: 07/07/2015 Document Revised: 02/28/2016 Document Reviewed: 04/11/2015 Elsevier Interactive Patient Education  2017 [employer].  Fall Prevention in the Home Falls can cause injuries. They can happen to people of all ages. There are many things you can do to make your home safe and to help prevent falls. What can I do on the outside of my home? Regularly fix the edges of walkways and driveways and fix any cracks. Remove anything that might make you trip as you walk through a door, such as a raised step or threshold. Trim any bushes or trees on the path to your home. Use bright outdoor lighting. Clear any walking paths of anything that might make someone trip, such as rocks or tools. Regularly check to see if handrails are loose or broken. Make sure that both sides of any steps have handrails. Any raised decks and porches should have guardrails on the edges. Have any leaves, snow, or ice cleared regularly. Use sand or salt on walking paths during winter. Clean up any spills in your garage right away. This includes oil or grease spills. What can I do in the bathroom? Use night lights. Install grab bars by the toilet and in the tub and shower. Do not use towel bars as grab bars. Use non-skid mats or decals in the tub or shower. If you need to sit down in the shower, use a plastic, non-slip stool. Keep the floor dry. Clean up any water that spills on the floor as soon as it happens. Remove soap buildup in the tub or shower regularly. Attach bath mats securely with double-sided non-slip rug tape. Do not have throw rugs and other things on the floor that can make you trip. What can I do in the bedroom? Use night lights. Make sure that you have a light by your bed that is easy to reach. Do not use any sheets or blankets that are too big for your bed. They should not hang down onto the  floor. Have a firm chair that has side arms. You can use this for support while you get dressed. Do not have throw rugs and other things on the floor that can make you trip. What can I do in the kitchen? Clean up any spills right away. Avoid walking on wet floors. Keep items that you use a lot in easy-to-reach places. If you need to reach something above you, use a strong step stool that has a grab bar. Keep electrical cords out of the way. Do not use floor polish or wax that makes floors slippery. If you must use wax, use non-skid floor wax. Do not have throw rugs and other things on the floor that can make you trip. What can I do with my stairs? Do not leave any items on the stairs. Make sure that there are handrails on both sides of the stairs and use them. Fix handrails that are broken or loose. Make sure that handrails are as long as the stairways. Check any carpeting to make sure that it is firmly attached to the stairs. Fix any carpet that is loose or worn. Avoid having throw rugs at the top or bottom of the stairs. If you do have throw rugs, attach them to the floor with carpet tape. Make sure that you have a light switch at the top of the  stairs and the bottom of the stairs. If you do not have them, ask someone to add them for you. What else can I do to help prevent falls? Wear shoes that: Do not have high heels. Have rubber bottoms. Are comfortable and fit you well. Are closed at the toe. Do not wear sandals. If you use a stepladder: Make sure that it is fully opened. Do not climb a closed stepladder. Make sure that both sides of the stepladder are locked into place. Ask someone to hold it for you, if possible. Clearly mark and make sure that you can see: Any grab bars or handrails. First and last steps. Where the edge of each step is. Use tools that help you move around (mobility aids) if they are needed. These include: Canes. Walkers. Scooters. Crutches. Turn on the  lights when you go into a dark area. Replace any light bulbs as soon as they burn out. Set up your furniture so you have a clear path. Avoid moving your furniture around. If any of your floors are uneven, fix them. If there are any pets around you, be aware of where they are. Review your medicines with your doctor. Some medicines can make you feel dizzy. This can increase your chance of falling. Ask your doctor what other things that you can do to help prevent falls. This information is not intended to replace advice given to you by your health care provider. Make sure you discuss any questions you have with your health care provider. Document Released: 04/06/2009 Document Revised: 11/16/2015 Document Reviewed: 07/15/2014 Elsevier Interactive Patient Education  2017 [employer].

## 2023-03-19 NOTE — Progress Notes (Signed)
Subjective:   Brendan Gonzales is a 67 y.o. male who presents for an Initial Medicare Annual Wellness Visit.  Visit Complete: Virtual  I connected with  Laurence Slate on 03/19/23 by a audio enabled telemedicine application and verified that I am speaking with the correct person using two identifiers.  Patient Location: Home  Provider Location: Home Office  I discussed the limitations of evaluation and management by telemedicine. The patient expressed understanding and agreed to proceed.  Because this visit was a virtual/telehealth visit, some criteria may be missing or patient reported. Any vitals not documented were not able to be obtained and vitals that have been documented are patient reported.    Cardiac Risk Factors include: advanced age (>72men, >74 women);diabetes mellitus;male gender;hypertension;obesity (BMI >30kg/m2)     Objective:    Today's Vitals   03/19/23 0902  PainSc: 5    There is no height or weight on file to calculate BMI.     03/19/2023    9:06 AM 03/12/2023    8:57 AM 09/10/2022   11:46 PM 04/26/2017    5:09 AM 07/27/2016    2:56 AM 10/11/2015    4:00 AM 03/17/2015   12:42 PM  Advanced Directives  Does Patient Have a Medical Advance Directive? No No No No No No No  Would patient like information on creating a medical advance directive? No - Patient declined    No - Patient declined No - patient declined information     Current Medications (verified) Outpatient Encounter Medications as of 03/19/2023  Medication Sig   acetaminophen (TYLENOL) 500 MG tablet Take 500 mg by mouth 3 (three) times daily as needed for mild pain.    amLODipine (NORVASC) 5 MG tablet Take 1 tablet by mouth once daily   atorvastatin (LIPITOR) 10 MG tablet Take 1 tablet (10 mg total) by mouth daily.   glimepiride (AMARYL) 2 MG tablet Take 1 tablet (2 mg total) by mouth daily with breakfast.   lisinopril-hydrochlorothiazide (ZESTORETIC) 20-12.5 MG tablet Take 2 tablets by mouth daily.    metFORMIN (GLUCOPHAGE) 500 MG tablet Take 2 tablets (1,000 mg total) by mouth 2 (two) times daily.   pantoprazole (PROTONIX) 40 MG tablet Take 1 tablet (40 mg total) by mouth daily.   cyclobenzaprine (FLEXERIL) 5 MG tablet Take 1 tablet (5 mg total) by mouth 3 (three) times daily as needed for muscle spasms (start qhs prn due to sedation). (Patient not taking: Reported on 03/06/2023)   ibuprofen (ADVIL) 600 MG tablet Take 1 tablet (600 mg total) by mouth every 6 (six) hours as needed. (Patient not taking: Reported on 03/06/2023)   meloxicam (MOBIC) 7.5 MG tablet Take 1 tablet (7.5 mg total) by mouth daily. (Patient not taking: Reported on 03/06/2023)   [DISCONTINUED] mupirocin ointment (BACTROBAN) 2 % Apply 1 Application topically 2 (two) times daily. (Patient not taking: Reported on 03/06/2023)   [DISCONTINUED] oxyCODONE (ROXICODONE) 5 MG immediate release tablet Take 1 tablet (5 mg total) by mouth every 4 (four) hours as needed for severe pain. (Patient not taking: Reported on 03/06/2023)   No facility-administered encounter medications on file as of 03/19/2023.    Allergies (verified) Metoprolol   History: Past Medical History:  Diagnosis Date   Arthritis    Constipation    Diabetes mellitus    type 2   Dizziness    Headache(784.0)    Hyperlipidemia    Hypertension    benign   Past Surgical History:  Procedure Laterality Date   COLONOSCOPY  CORONARY ANGIOPLASTY     HEMORRHOID SURGERY     right femeroral bypass     right knee surgery     Family History  Problem Relation Age of Onset   Diabetes Other    Hypertension Other    Anesthesia problems Neg Hx    Hypotension Neg Hx    Malignant hyperthermia Neg Hx    Pseudochol deficiency Neg Hx    Social History   Socioeconomic History   Marital status: Married    Spouse name: Not on file   Number of children: Not on file   Years of education: Not on file   Highest education level: Not on file  Occupational History   Not on  file  Tobacco Use   Smoking status: Former   Smokeless tobacco: Never  Substance and Sexual Activity   Alcohol use: No   Drug use: No   Sexual activity: Never  Other Topics Concern   Not on file  Social History Narrative   Not on file   Social Determinants of Health   Financial Resource Strain: Low Risk  (03/19/2023)   Overall Financial Resource Strain (CARDIA)    Difficulty of Paying Living Expenses: Not hard at all  Food Insecurity: No Food Insecurity (03/19/2023)   Hunger Vital Sign    Worried About Running Out of Food in the Last Year: Never true    Ran Out of Food in the Last Year: Never true  Transportation Needs: No Transportation Needs (03/19/2023)   PRAPARE - Administrator, Civil Service (Medical): No    Lack of Transportation (Non-Medical): No  Physical Activity: Insufficiently Active (03/19/2023)   Exercise Vital Sign    Days of Exercise per Week: 3 days    Minutes of Exercise per Session: 20 min  Stress: Stress Concern Present (03/19/2023)   [employer] of Occupational Health - Occupational Stress Questionnaire    Feeling of Stress : To some extent  Social Connections: Moderately Integrated (03/19/2023)   Social Connection and Isolation Panel [NHANES]    Frequency of Communication with Friends and Family: More than three times a week    Frequency of Social Gatherings with Friends and Family: Twice a week    Attends Religious Services: Never    Diplomatic Services operational officer: No    Attends Engineer, structural: More than 4 times per year    Marital Status: Married    Tobacco Counseling Counseling given: Not Answered   Clinical Intake:  Pre-visit preparation completed: Yes  Pain : 0-10 Pain Score: 5  Pain Type: Chronic pain Pain Location: Knee Pain Orientation: Left Pain Descriptors / Indicators: Burning, Aching, Constant Pain Onset: More than a month ago Pain Frequency: Constant Pain Relieving Factors: tylenol  Pain  Relieving Factors: tylenol  Diabetes: Yes CBG done?: No Did pt. bring in CBG monitor from home?: No  How often do you need to have someone help you when you read instructions, pamphlets, or other written materials from your doctor or pharmacy?: 1 - Never  Interpreter Needed?: No  Information entered by :: Remi Haggard LPN   Activities of Daily Living    03/19/2023    9:07 AM 03/12/2023    9:03 AM  In your present state of health, do you have any difficulty performing the following activities:  Hearing? 0   Vision? 0   Difficulty concentrating or making decisions? 0   Dressing or bathing? 0   Doing errands, shopping? 0 0  Preparing Food and eating ? N   Using the Toilet? N   In the past six months, have you accidently leaked urine? N   Do you have problems with loss of bowel control? N   Managing your Medications? N   Managing your Finances? N   Housekeeping or managing your Housekeeping? N     Patient Care Team: [name], MD as PCP - General (Family Medicine) Charlott Rakes, MD as Consulting Physician (Gastroenterology)  Indicate any recent Medical Services you may have received from other than [facility] providers in the past year (date may be approximate).     Assessment:   This is a routine wellness examination for [facility].  Hearing/Vision screen Hearing Screening - Comments:: No trouble hearing Vision Screening - Comments:: Up to date [facility]    Goals Addressed             This Visit's Progress    Patient Stated       Loose wieght       Depression Screen    03/19/2023    9:12 AM 01/06/2023    9:06 AM 11/20/2022    9:13 AM 09/23/2022    9:21 AM 09/05/2022   10:36 AM 06/19/2022    9:13 AM 03/11/2022    8:28 AM  PHQ 2/9 Scores  PHQ - 2 Score 0 0 0 0 1 0 0  PHQ- 9 Score 3 0 3 1 4 3 2     Fall Risk    03/19/2023    9:04 AM 01/06/2023    9:06 AM 11/20/2022    9:13 AM 10/17/2022   10:12 AM 09/23/2022    9:21 AM  Fall Risk   Falls in  the past year? 0 0 0 0 0  Number falls in past yr: 0 0 0  0  Injury with Fall? 0 0 0  0  Risk for fall due to : Impaired balance/gait No Fall Risks No Fall Risks No Fall Risks No Fall Risks  Follow up Falls evaluation completed;Education provided;Falls prevention discussed Falls evaluation completed Falls evaluation completed      MEDICARE RISK AT HOME: Medicare Risk at Home Any stairs in or around the home?: Yes If so, are there any without handrails?: No Home free of loose throw rugs in walkways, pet beds, electrical cords, etc?: Yes Adequate lighting in your home to reduce risk of falls?: Yes Life alert?: No Use of a cane, walker or w/c?: Yes Grab bars in the bathroom?: Yes Shower chair or bench in shower?: No Elevated toilet seat or a handicapped toilet?: No  TIMED UP AND GO:  Was the test performed? No    Cognitive Function:        03/19/2023    9:08 AM  6CIT Screen  What Year? 0 points  What month? 0 points  What time? 0 points  Count back from 20 2 points  Months in reverse 0 points  Repeat phrase 6 points  Total Score 8 points    Immunizations Immunization History  Administered Date(s) Administered   Influenza,inj,Quad PF,6+ Mos 05/31/2013, 03/07/2014, 04/28/2017, 03/12/2021, 03/04/2022   Influenza,inj,quad, With Preservative 03/12/2018   Influenza-Unspecified 04/04/2015, 05/17/2016, 03/25/2019, 03/31/2020   PFIZER(Purple Top)SARS-COV-2 Vaccination 09/05/2019, 09/25/2019, 06/17/2020, 10/05/2020   PNEUMOCOCCAL CONJUGATE-20 06/27/2021   Pneumococcal Polysaccharide-23 06/13/2013   Tdap 01/23/2015    TDAP status: Up to date  Flu Vaccine status: Due, Education has been provided regarding the importance of this vaccine. Advised may receive this vaccine  at local pharmacy or Health Dept. Aware to provide a copy of the vaccination record if obtained from local pharmacy or Health Dept. Verbalized acceptance and understanding.  Pneumococcal vaccine status: Up to  date  Covid-19 vaccine status: Information provided on how to obtain vaccines.   Qualifies for Shingles Vaccine? Yes   Zostavax completed No   Shingrix Completed?: No.    Education has been provided regarding the importance of this vaccine. Patient has been advised to call insurance company to determine out of pocket expense if they have not yet received this vaccine. Advised may also receive vaccine at local pharmacy or Health Dept. Verbalized acceptance and understanding.  Screening Tests Health Maintenance  Topic Date Due   COVID-19 Vaccine (5 - 2023-24 season) 04/04/2023 (Originally 02/23/2023)   Zoster Vaccines- Shingrix (1 of 2) 06/18/2023 (Originally 03/21/2007)   INFLUENZA VACCINE  09/22/2023 (Originally 01/23/2023)   Colonoscopy  03/18/2024 (Originally 06/26/2021)   OPHTHALMOLOGY EXAM  07/05/2023   HEMOGLOBIN A1C  09/09/2023   FOOT EXAM  01/06/2024   DTaP/Tdap/Td (2 - Td or Tdap) 01/22/2025   Pneumonia Vaccine 36+ Years old  Completed   Hepatitis C Screening  Completed   HIV Screening  Completed   HPV VACCINES  Aged Out    Health Maintenance  There are no preventive care reminders to display for this patient.   Colonoscopy patient will revisit after knee surgery  Lung Cancer Screening: (Low Dose CT Chest recommended if Age 26-80 years, 20 pack-year currently smoking OR have quit w/in 15years.) does not qualify.   Lung Cancer Screening Referral:   Additional Screening:  Hepatitis C Screening: does not qualify; Completed 2020  Vision Screening: Recommended annual ophthalmology exams for early detection of glaucoma and other disorders of the eye. Is the patient up to date with their annual eye exam?  Yes  Who is the provider or what is the name of the office in which the patient attends annual eye exams? [facility] If pt is not established with a provider, would they like to be referred to a provider to establish care? No .   Dental Screening: Recommended annual  dental exams for proper oral hygiene  Nutrition Risk Assessment:  Has the patient had any N/V/D within the last 2 months?  No  Does the patient have any non-healing wounds?  No  Has the patient had any unintentional weight loss or weight gain?  No   Diabetes:  Is the patient diabetic?  Yes  If diabetic, was a CBG obtained today?  No  Did the patient bring in their glucometer from home?  No  How often do you monitor your CBG's? 2x daily.   Financial Strains and Diabetes Management:  Are you having any financial strains with the device, your supplies or your medication? No .  Does the patient want to be seen by Chronic Care Management for management of their diabetes?  No  Would the patient like to be referred to a Nutritionist or for Diabetic Management?  No   Diabetic Exams:  Diabetic Eye Exam: Completed Pt has been advised about the importance in completing this exam.  Diabetic Foot Exam: . Pt has been advised about the importance in completing this exam.  Community Resource Referral / Chronic Care Management: CRR required this visit?  No   CCM required this visit?  No    Plan:     I have personally reviewed and noted the following in the patient's chart:   Medical  and social history Use of alcohol, tobacco or illicit drugs  Current medications and supplements including opioid prescriptions. Patient is not currently taking opioid prescriptions. Functional ability and status Nutritional status Physical activity Advanced directives List of other physicians Hospitalizations, surgeries, and ER visits in previous 12 months Vitals Screenings to include cognitive, depression, and falls Referrals and appointments  In addition, I have reviewed and discussed with patient certain preventive protocols, quality metrics, and best practice recommendations. A written personalized care plan for preventive services as well as general preventive health recommendations were provided  to patient.     Remi Haggard, LPN   09/30/8117   After Visit Summary: (MyChart) Due to this being a telephonic visit, the after visit summary with patients personalized plan was offered to patient via MyChart   Nurse Notes:

## 2023-03-25 ENCOUNTER — Ambulatory Visit (HOSPITAL_COMMUNITY): Payer: Medicare HMO | Admitting: Anesthesiology

## 2023-03-25 ENCOUNTER — Encounter (HOSPITAL_COMMUNITY)
Admission: RE | Disposition: A | Discharge status: Home / Self Care | Payer: Self-pay | Source: Ambulatory Visit | Attending: Orthopedic Surgery

## 2023-03-25 ENCOUNTER — Other Ambulatory Visit: Payer: Self-pay

## 2023-03-25 ENCOUNTER — Observation Stay (HOSPITAL_COMMUNITY)
Admission: RE | Admit: 2023-03-25 | Discharge: 2023-03-27 | Disposition: A | Discharge status: Home / Self Care | Payer: Medicare HMO | Source: Ambulatory Visit | Attending: Orthopedic Surgery | Admitting: Orthopedic Surgery

## 2023-03-25 ENCOUNTER — Encounter (HOSPITAL_COMMUNITY): Payer: Self-pay | Admitting: Orthopedic Surgery

## 2023-03-25 DIAGNOSIS — Z87891 Personal history of nicotine dependence: Secondary | ICD-10-CM | POA: Diagnosis not present

## 2023-03-25 DIAGNOSIS — Z96652 Presence of left artificial knee joint: Principal | ICD-10-CM

## 2023-03-25 DIAGNOSIS — I10 Essential (primary) hypertension: Secondary | ICD-10-CM | POA: Diagnosis not present

## 2023-03-25 DIAGNOSIS — M1712 Unilateral primary osteoarthritis, left knee: Secondary | ICD-10-CM | POA: Diagnosis not present

## 2023-03-25 DIAGNOSIS — Z7984 Long term (current) use of oral hypoglycemic drugs: Secondary | ICD-10-CM | POA: Diagnosis not present

## 2023-03-25 DIAGNOSIS — E119 Type 2 diabetes mellitus without complications: Secondary | ICD-10-CM | POA: Insufficient documentation

## 2023-03-25 DIAGNOSIS — G8918 Other acute postprocedural pain: Secondary | ICD-10-CM | POA: Diagnosis not present

## 2023-03-25 DIAGNOSIS — Z79899 Other long term (current) drug therapy: Secondary | ICD-10-CM | POA: Insufficient documentation

## 2023-03-25 HISTORY — PX: TOTAL KNEE ARTHROPLASTY: SHX125

## 2023-03-25 LAB — GLUCOSE, CAPILLARY
Glucose-Capillary: 158 mg/dL — ABNORMAL HIGH (ref 70–99)
Glucose-Capillary: 198 mg/dL — ABNORMAL HIGH (ref 70–99)
Glucose-Capillary: 273 mg/dL — ABNORMAL HIGH (ref 70–99)

## 2023-03-25 SURGERY — ARTHROPLASTY, KNEE, TOTAL
Anesthesia: Spinal | Site: Knee | Laterality: Left

## 2023-03-25 MED ORDER — METOCLOPRAMIDE HCL 5 MG PO TABS: 5.0000 mg | ORAL_TABLET | Freq: Three times a day (TID) | ORAL | Status: DC | PRN

## 2023-03-25 MED ORDER — ONDANSETRON HCL 4 MG/2ML IJ SOLN: 4.0000 mg | Freq: Four times a day (QID) | INTRAMUSCULAR | Status: DC | PRN

## 2023-03-25 MED ORDER — TRANEXAMIC ACID-NACL 1000-0.7 MG/100ML-% IV SOLN
1000.0000 mg | INTRAVENOUS | Status: AC
  Administered 2023-03-25: 1000 mg via INTRAVENOUS
  Filled 2023-03-25: qty 100

## 2023-03-25 MED ORDER — OXYCODONE HCL 5 MG PO TABS: 10.0000 mg | ORAL_TABLET | ORAL | Status: DC | PRN

## 2023-03-25 MED ORDER — BUPIVACAINE IN DEXTROSE 0.75-8.25 % IT SOLN
INTRATHECAL | Status: DC | PRN
Start: 2023-03-25 — End: 2023-03-25
  Administered 2023-03-25: 1.6 mL via INTRATHECAL

## 2023-03-25 MED ORDER — HYDROCHLOROTHIAZIDE 25 MG PO TABS
25.0000 mg | ORAL_TABLET | Freq: Every day | ORAL | Status: DC
  Administered 2023-03-26 – 2023-03-27 (×2): 25 mg via ORAL
  Filled 2023-03-25 (×2): qty 1

## 2023-03-25 MED ORDER — DEXAMETHASONE SODIUM PHOSPHATE 10 MG/ML IJ SOLN
INTRAMUSCULAR | Status: AC
  Filled 2023-03-25: qty 1

## 2023-03-25 MED ORDER — METOCLOPRAMIDE HCL 5 MG/ML IJ SOLN: 5.0000 mg | Freq: Three times a day (TID) | INTRAMUSCULAR | Status: DC | PRN

## 2023-03-25 MED ORDER — SODIUM CHLORIDE (PF) 0.9 % IJ SOLN
INTRAMUSCULAR | Status: DC | PRN
  Administered 2023-03-25: 30 mL

## 2023-03-25 MED ORDER — FENTANYL CITRATE PF 50 MCG/ML IJ SOSY
PREFILLED_SYRINGE | INTRAMUSCULAR | Status: AC
  Administered 2023-03-25: 50 ug via INTRAVENOUS
  Filled 2023-03-25: qty 2

## 2023-03-25 MED ORDER — ASPIRIN 81 MG PO CHEW
81.0000 mg | CHEWABLE_TABLET | Freq: Two times a day (BID) | ORAL | Status: DC
  Administered 2023-03-25 – 2023-03-27 (×4): 81 mg via ORAL
  Filled 2023-03-25 (×4): qty 1

## 2023-03-25 MED ORDER — LISINOPRIL 20 MG PO TABS
40.0000 mg | ORAL_TABLET | Freq: Every day | ORAL | Status: DC
  Administered 2023-03-26 – 2023-03-27 (×2): 40 mg via ORAL
  Filled 2023-03-25 (×2): qty 2

## 2023-03-25 MED ORDER — FENTANYL CITRATE PF 50 MCG/ML IJ SOSY: 50.0000 ug | PREFILLED_SYRINGE | INTRAMUSCULAR | Status: DC

## 2023-03-25 MED ORDER — ATORVASTATIN CALCIUM 10 MG PO TABS
10.0000 mg | ORAL_TABLET | Freq: Every day | ORAL | Status: DC
  Administered 2023-03-26 – 2023-03-27 (×2): 10 mg via ORAL
  Filled 2023-03-25 (×2): qty 1

## 2023-03-25 MED ORDER — ACETAMINOPHEN 500 MG PO TABS
1000.0000 mg | ORAL_TABLET | Freq: Four times a day (QID) | ORAL | Status: DC
  Administered 2023-03-25 – 2023-03-27 (×6): 1000 mg via ORAL
  Filled 2023-03-25 (×7): qty 2

## 2023-03-25 MED ORDER — GLIMEPIRIDE 2 MG PO TABS
2.0000 mg | ORAL_TABLET | Freq: Every day | ORAL | Status: DC
  Administered 2023-03-26 – 2023-03-27 (×2): 2 mg via ORAL
  Filled 2023-03-25 (×2): qty 1

## 2023-03-25 MED ORDER — POVIDONE-IODINE 10 % EX SWAB: 2.0000 | Freq: Once | CUTANEOUS | Status: DC

## 2023-03-25 MED ORDER — HYDROCHLOROTHIAZIDE 12.5 MG PO TABS: 12.5000 mg | ORAL_TABLET | Freq: Every day | ORAL | Status: DC

## 2023-03-25 MED ORDER — AMLODIPINE BESYLATE 5 MG PO TABS
5.0000 mg | ORAL_TABLET | Freq: Every day | ORAL | Status: DC
  Administered 2023-03-25 – 2023-03-27 (×3): 5 mg via ORAL
  Filled 2023-03-25 (×3): qty 1

## 2023-03-25 MED ORDER — ACETAMINOPHEN 500 MG PO TABS
ORAL_TABLET | ORAL | Status: AC
  Administered 2023-03-25: 1000 mg via ORAL
  Filled 2023-03-25: qty 2

## 2023-03-25 MED ORDER — ORAL CARE MOUTH RINSE: 15.0000 mL | Freq: Once | OROMUCOSAL | Status: AC

## 2023-03-25 MED ORDER — DEXAMETHASONE SODIUM PHOSPHATE 10 MG/ML IJ SOLN
10.0000 mg | Freq: Once | INTRAMUSCULAR | Status: AC
  Administered 2023-03-26: 10 mg via INTRAVENOUS
  Filled 2023-03-25: qty 1

## 2023-03-25 MED ORDER — MELOXICAM 15 MG PO TABS: 15.0000 mg | ORAL_TABLET | Freq: Every day | ORAL | Status: DC

## 2023-03-25 MED ORDER — OXYCODONE HCL 5 MG PO TABS
5.0000 mg | ORAL_TABLET | ORAL | Status: DC | PRN
  Administered 2023-03-25 – 2023-03-26 (×3): 10 mg via ORAL
  Administered 2023-03-26: 5 mg via ORAL
  Administered 2023-03-26 – 2023-03-27 (×2): 10 mg via ORAL
  Administered 2023-03-27: 5 mg via ORAL
  Administered 2023-03-27: 10 mg via ORAL
  Filled 2023-03-25 (×3): qty 2
  Filled 2023-03-25: qty 1
  Filled 2023-03-25 (×2): qty 2
  Filled 2023-03-25: qty 1
  Filled 2023-03-25: qty 2

## 2023-03-25 MED ORDER — BUPIVACAINE-EPINEPHRINE 0.25% -1:200000 IJ SOLN
INTRAMUSCULAR | Status: AC
  Filled 2023-03-25: qty 1

## 2023-03-25 MED ORDER — SODIUM CHLORIDE (PF) 0.9 % IJ SOLN
INTRAMUSCULAR | Status: AC
  Filled 2023-03-25: qty 50

## 2023-03-25 MED ORDER — ONDANSETRON HCL 4 MG PO TABS: 4.0000 mg | ORAL_TABLET | Freq: Four times a day (QID) | ORAL | Status: DC | PRN

## 2023-03-25 MED ORDER — PANTOPRAZOLE SODIUM 40 MG PO TBEC
40.0000 mg | DELAYED_RELEASE_TABLET | Freq: Every day | ORAL | Status: DC
  Administered 2023-03-26 – 2023-03-27 (×2): 40 mg via ORAL
  Filled 2023-03-25 (×2): qty 1

## 2023-03-25 MED ORDER — HYDROMORPHONE HCL 1 MG/ML IJ SOLN
0.2500 mg | INTRAMUSCULAR | Status: DC | PRN
  Administered 2023-03-25: 0.5 mg via INTRAVENOUS

## 2023-03-25 MED ORDER — CEFAZOLIN SODIUM-DEXTROSE 2-4 GM/100ML-% IV SOLN
2.0000 g | Freq: Four times a day (QID) | INTRAVENOUS | Status: AC
  Administered 2023-03-25 – 2023-03-26 (×2): 2 g via INTRAVENOUS
  Filled 2023-03-25 (×2): qty 100

## 2023-03-25 MED ORDER — POLYETHYLENE GLYCOL 3350 17 G PO PACK
17.0000 g | PACK | Freq: Two times a day (BID) | ORAL | Status: DC
  Administered 2023-03-25 – 2023-03-26 (×3): 17 g via ORAL
  Filled 2023-03-25 (×3): qty 1

## 2023-03-25 MED ORDER — STERILE WATER FOR IRRIGATION IR SOLN
Status: DC | PRN
  Administered 2023-03-25: 2000 mL

## 2023-03-25 MED ORDER — INSULIN ASPART 100 UNIT/ML IJ SOLN
0.0000 [IU] | Freq: Three times a day (TID) | INTRAMUSCULAR | Status: DC
  Administered 2023-03-25: 3 [IU] via SUBCUTANEOUS
  Administered 2023-03-26 (×3): 5 [IU] via SUBCUTANEOUS

## 2023-03-25 MED ORDER — BUPIVACAINE-EPINEPHRINE (PF) 0.5% -1:200000 IJ SOLN
INTRAMUSCULAR | Status: DC | PRN
  Administered 2023-03-25: 20 mL via PERINEURAL

## 2023-03-25 MED ORDER — PHENOL 1.4 % MT LIQD: 1.0000 | OROMUCOSAL | Status: DC | PRN

## 2023-03-25 MED ORDER — MENTHOL 3 MG MT LOZG: 1.0000 | LOZENGE | OROMUCOSAL | Status: DC | PRN

## 2023-03-25 MED ORDER — DIPHENHYDRAMINE HCL 12.5 MG/5ML PO ELIX
12.5000 mg | ORAL_SOLUTION | ORAL | Status: DC | PRN
  Administered 2023-03-27: 12.5 mg via ORAL
  Filled 2023-03-25: qty 5

## 2023-03-25 MED ORDER — METHOCARBAMOL 500 MG PO TABS
500.0000 mg | ORAL_TABLET | Freq: Four times a day (QID) | ORAL | Status: DC | PRN
  Administered 2023-03-25 – 2023-03-27 (×5): 500 mg via ORAL
  Filled 2023-03-25 (×5): qty 1

## 2023-03-25 MED ORDER — LACTATED RINGERS IV SOLN: INTRAVENOUS | Status: DC

## 2023-03-25 MED ORDER — METHOCARBAMOL 1000 MG/10ML IJ SOLN: 500.0000 mg | Freq: Four times a day (QID) | INTRAVENOUS | Status: DC | PRN

## 2023-03-25 MED ORDER — SODIUM CHLORIDE 0.9 % IR SOLN
Status: DC | PRN
  Administered 2023-03-25: 1000 mL

## 2023-03-25 MED ORDER — TRANEXAMIC ACID-NACL 1000-0.7 MG/100ML-% IV SOLN
1000.0000 mg | Freq: Once | INTRAVENOUS | Status: AC
  Administered 2023-03-25: 1000 mg via INTRAVENOUS
  Filled 2023-03-25: qty 100

## 2023-03-25 MED ORDER — PROPOFOL 1000 MG/100ML IV EMUL
INTRAVENOUS | Status: AC
  Filled 2023-03-25: qty 100

## 2023-03-25 MED ORDER — ONDANSETRON HCL 4 MG/2ML IJ SOLN
INTRAMUSCULAR | Status: AC
  Filled 2023-03-25: qty 2

## 2023-03-25 MED ORDER — HYDROMORPHONE HCL 1 MG/ML IJ SOLN
0.5000 mg | INTRAMUSCULAR | Status: DC | PRN
  Administered 2023-03-25: 1 mg via INTRAVENOUS
  Filled 2023-03-25: qty 1

## 2023-03-25 MED ORDER — PROPOFOL 500 MG/50ML IV EMUL
INTRAVENOUS | Status: DC | PRN
  Administered 2023-03-25 (×2): 40 mg via INTRAVENOUS
  Administered 2023-03-25: 75 ug/kg/min via INTRAVENOUS
  Administered 2023-03-25 (×2): 30 mg via INTRAVENOUS

## 2023-03-25 MED ORDER — BUPIVACAINE-EPINEPHRINE (PF) 0.25% -1:200000 IJ SOLN
INTRAMUSCULAR | Status: DC | PRN
  Administered 2023-03-25: 30 mL via PERINEURAL

## 2023-03-25 MED ORDER — SENNA 8.6 MG PO TABS
2.0000 | ORAL_TABLET | Freq: Every day | ORAL | Status: DC
  Administered 2023-03-25 – 2023-03-26 (×2): 17.2 mg via ORAL
  Filled 2023-03-25 (×2): qty 2

## 2023-03-25 MED ORDER — ALUM & MAG HYDROXIDE-SIMETH 200-200-20 MG/5ML PO SUSP: 30.0000 mL | ORAL | Status: DC | PRN

## 2023-03-25 MED ORDER — BISACODYL 10 MG RE SUPP: 10.0000 mg | Freq: Every day | RECTAL | Status: DC | PRN

## 2023-03-25 MED ORDER — LACTATED RINGERS IV SOLN: INTRAVENOUS | Status: DC | PRN

## 2023-03-25 MED ORDER — HYDROMORPHONE HCL 1 MG/ML IJ SOLN
INTRAMUSCULAR | Status: AC
  Administered 2023-03-25: 0.5 mg via INTRAVENOUS
  Filled 2023-03-25: qty 1

## 2023-03-25 MED ORDER — LISINOPRIL-HYDROCHLOROTHIAZIDE 20-12.5 MG PO TABS: 2.0000 | ORAL_TABLET | Freq: Every day | ORAL | Status: DC

## 2023-03-25 MED ORDER — KETOROLAC TROMETHAMINE 30 MG/ML IJ SOLN
INTRAMUSCULAR | Status: DC | PRN
  Administered 2023-03-25: 30 mg

## 2023-03-25 MED ORDER — 0.9 % SODIUM CHLORIDE (POUR BTL) OPTIME
TOPICAL | Status: DC | PRN
  Administered 2023-03-25: 1000 mL

## 2023-03-25 MED ORDER — CHLORHEXIDINE GLUCONATE 0.12 % MT SOLN
15.0000 mL | Freq: Once | OROMUCOSAL | Status: AC
  Administered 2023-03-25: 15 mL via OROMUCOSAL

## 2023-03-25 MED ORDER — DEXAMETHASONE SODIUM PHOSPHATE 10 MG/ML IJ SOLN
8.0000 mg | Freq: Once | INTRAMUSCULAR | Status: AC
  Administered 2023-03-25: 4 mg via INTRAVENOUS

## 2023-03-25 MED ORDER — PROPOFOL 10 MG/ML IV BOLUS
INTRAVENOUS | Status: AC
  Filled 2023-03-25: qty 20

## 2023-03-25 MED ORDER — ONDANSETRON HCL 4 MG/2ML IJ SOLN
INTRAMUSCULAR | Status: DC | PRN
Start: 2023-03-25 — End: 2023-03-25
  Administered 2023-03-25: 4 mg via INTRAVENOUS

## 2023-03-25 MED ORDER — CEFAZOLIN SODIUM-DEXTROSE 2-4 GM/100ML-% IV SOLN
2.0000 g | INTRAVENOUS | Status: AC
  Administered 2023-03-25: 2 g via INTRAVENOUS
  Filled 2023-03-25: qty 100

## 2023-03-25 MED ORDER — METFORMIN HCL 500 MG PO TABS
1000.0000 mg | ORAL_TABLET | Freq: Two times a day (BID) | ORAL | Status: DC
  Administered 2023-03-26 – 2023-03-27 (×3): 1000 mg via ORAL
  Filled 2023-03-25 (×3): qty 2

## 2023-03-25 MED ORDER — SODIUM CHLORIDE 0.9 % IV SOLN: INTRAVENOUS | Status: DC

## 2023-03-25 MED ORDER — KETOROLAC TROMETHAMINE 30 MG/ML IJ SOLN
INTRAMUSCULAR | Status: AC
  Filled 2023-03-25: qty 1

## 2023-03-25 MED ORDER — LISINOPRIL 20 MG PO TABS: 20.0000 mg | ORAL_TABLET | Freq: Every day | ORAL | Status: DC

## 2023-03-25 MED ORDER — ACETAMINOPHEN 500 MG PO TABS: 1000.0000 mg | ORAL_TABLET | Freq: Once | ORAL | Status: AC

## 2023-03-25 SURGICAL SUPPLY — 58 items
ADH SKN CLS APL DERMABOND .7 (GAUZE/BANDAGES/DRESSINGS) ×1
ATTUNE MED ANAT PAT 38 KNEE (Knees) IMPLANT
BAG COUNTER SPONGE SURGICOUNT (BAG) IMPLANT
BAG SPEC THK2 15X12 ZIP CLS (MISCELLANEOUS)
BAG SPNG CNTER NS LX DISP (BAG)
BAG ZIPLOCK 12X15 (MISCELLANEOUS) IMPLANT
BASE TIBIAL CEM ATTUNE SZ 7 (Knees) ×1 IMPLANT
BASEPLATE TIB CEM ATTUNE SZ7 (Knees) IMPLANT
BLADE SAW SGTL 13.0X1.19X90.0M (BLADE) ×1 IMPLANT
BNDG CMPR 6 X 5 YARDS HK CLSR (GAUZE/BANDAGES/DRESSINGS) ×1
BNDG CMPR MED 10X6 ELC LF (GAUZE/BANDAGES/DRESSINGS) ×1
BNDG ELASTIC 6INX 5YD STR LF (GAUZE/BANDAGES/DRESSINGS) ×1 IMPLANT
BNDG ELASTIC 6X10 VLCR STRL LF (GAUZE/BANDAGES/DRESSINGS) IMPLANT
BOWL SMART MIX CTS (DISPOSABLE) ×2 IMPLANT
BSPLAT TIB 7 CMNT FX BRNG STRL (Knees) ×1 IMPLANT
CEMENT HV SMART SET (Cement) IMPLANT
COMP FEM CMT ATTUNE 8 LT (Joint) ×1 IMPLANT
COMPONENT FEM CMT ATTUNE 8 LT (Joint) IMPLANT
COVER SURGICAL LIGHT HANDLE (MISCELLANEOUS) ×2 IMPLANT
CUFF TOURN SGL QUICK 34 (TOURNIQUET CUFF) ×1
CUFF TRNQT CYL 34X4.125X (TOURNIQUET CUFF) ×1 IMPLANT
DERMABOND ADVANCED .7 DNX12 (GAUZE/BANDAGES/DRESSINGS) ×2 IMPLANT
DRAPE U-SHAPE 47X51 STRL (DRAPES) ×1 IMPLANT
DRESSING AQUACEL AG SP 3.5X10 (GAUZE/BANDAGES/DRESSINGS) ×1 IMPLANT
DRSG AQUACEL AG SP 3.5X10 (GAUZE/BANDAGES/DRESSINGS) ×1
DURAPREP 26ML APPLICATOR (WOUND CARE) ×4 IMPLANT
ELECT REM PT RETURN 15FT ADLT (MISCELLANEOUS) ×1 IMPLANT
GLOVE BIO SURGEON STRL SZ 6 (GLOVE) ×2 IMPLANT
GLOVE BIOGEL PI IND STRL 6.5 (GLOVE) ×1 IMPLANT
GLOVE BIOGEL PI IND STRL 7.5 (GLOVE) ×2 IMPLANT
GLOVE ORTHO TXT STRL SZ7.5 (GLOVE) ×2 IMPLANT
GOWN STRL REUS W/ TWL LRG LVL3 (GOWN DISPOSABLE) ×2 IMPLANT
GOWN STRL REUS W/TWL LRG LVL3 (GOWN DISPOSABLE) ×2
HANDPIECE INTERPULSE COAX TIP (DISPOSABLE) ×1
HOLDER FOLEY CATH W/STRAP (MISCELLANEOUS) IMPLANT
INSERT MED ATTUNE KNEE 8 5LT (Insert) IMPLANT
INSR MED ATTUNE KNEE 8 5LT (Insert) ×1 IMPLANT
KIT TURNOVER KIT A (KITS) IMPLANT
MANIFOLD NEPTUNE II (INSTRUMENTS) ×1 IMPLANT
NDL SAFETY ECLIP 18X1.5 (MISCELLANEOUS) IMPLANT
NS IRRIG 1000ML POUR BTL (IV SOLUTION) ×1 IMPLANT
PACK TOTAL KNEE CUSTOM (KITS) ×1 IMPLANT
PIN FIX SIGMA LCS THRD HI (PIN) IMPLANT
PROTECTOR NERVE ULNAR (MISCELLANEOUS) ×2 IMPLANT
SET HNDPC FAN SPRY TIP SCT (DISPOSABLE) ×2 IMPLANT
SET PAD KNEE POSITIONER (MISCELLANEOUS) ×1 IMPLANT
SPIKE FLUID TRANSFER (MISCELLANEOUS) ×4 IMPLANT
SUT MNCRL AB 4-0 PS2 18 (SUTURE) ×1 IMPLANT
SUT STRATAFIX PDS+ 0 24IN (SUTURE) ×1 IMPLANT
SUT VIC AB 1 CT1 36 (SUTURE) ×1 IMPLANT
SUT VIC AB 2-0 CT1 27 (SUTURE) ×2
SUT VIC AB 2-0 CT1 TAPERPNT 27 (SUTURE) ×4 IMPLANT
SYR 3ML LL SCALE MARK (SYRINGE) ×1 IMPLANT
TOWEL GREEN STERILE FF (TOWEL DISPOSABLE) ×1 IMPLANT
TRAY FOLEY MTR SLVR 16FR STAT (SET/KITS/TRAYS/PACK) ×2 IMPLANT
TUBE SUCTION HIGH CAP CLEAR NV (SUCTIONS) ×2 IMPLANT
WATER STERILE IRR 1000ML POUR (IV SOLUTION) ×4 IMPLANT
WRAP KNEE MAXI GEL POST OP (GAUZE/BANDAGES/DRESSINGS) ×1 IMPLANT

## 2023-03-25 NOTE — H&P (Signed)
TOTAL KNEE ADMISSION H&P  Patient is being admitted for left total knee arthroplasty.  Therapy Plans: outpatient therapy Disposition: Home with sister, wife, daughter Planned DVT Prophylaxis: aspirin 81 mg BID DME needed: none PCP: Dr. Trinna Post - clearance received TXA: IV Allergies: metoprolol - diarrhea Anesthesia Concerns: none BMI: 27.7 Last HgbA1c: 7.4%  Other: - oxycodone (few), robaxin, tylenol, meloxicam - No hx of VTE or cancer  Subjective:  Chief Complaint:left knee pain.  HPI: Brendan Gonzales, 67 y.o. male, has a history of pain and functional disability in the left knee due to arthritis and has failed non-surgical conservative treatments for greater than 12 weeks to includeNSAID's and/or analgesics, corticosteriod injections, viscosupplementation injections, and activity modification.  Onset of symptoms was gradual, starting 2 years ago with gradually worsening course since that time. The patient noted no past surgery on the left knee(s).  Patient currently rates pain in the left knee(s) at 8 out of 10 with activity. Patient has worsening of pain with activity and weight bearing, pain that interferes with activities of daily living, and pain with passive range of motion.  Patient has evidence of joint space narrowing by imaging studies.  There is no active infection.  Patient Active Problem List   Diagnosis Date Noted   Stiffness of left knee 03/12/2023   Osteoarthritis of left knee 12/29/2022   Derangement of left knee 12/29/2022   Hyperlipidemia 09/05/2022   Hospice care patient 07/06/2019   Eczema of hand 10/19/2014   Type 2 diabetes mellitus (HCC) 06/13/2013   Essential hypertension 06/13/2013   Normal coronary arteries- 2008 06/13/2013   Chest pain with moderate risk of acute coronary syndrome 06/12/2013   Chest pain 06/12/2013   Special screening for malignant neoplasms, colon 06/27/2011   Arthritis of knee 06/14/2011   Past Medical History:  Diagnosis  Date   Arthritis    Constipation    Diabetes mellitus    type 2   Dizziness    Headache(784.0)    Hyperlipidemia    Hypertension    benign    Past Surgical History:  Procedure Laterality Date   COLONOSCOPY     CORONARY ANGIOPLASTY     HEMORRHOID SURGERY     right femeroral bypass     right knee surgery      No current facility-administered medications for this encounter.   Current Outpatient Medications  Medication Sig Dispense Refill Last Dose   acetaminophen (TYLENOL) 500 MG tablet Take 500 mg by mouth 3 (three) times daily as needed for mild pain.       amLODipine (NORVASC) 5 MG tablet Take 1 tablet by mouth once daily 90 tablet 0    atorvastatin (LIPITOR) 10 MG tablet Take 1 tablet (10 mg total) by mouth daily. 90 tablet 1    glimepiride (AMARYL) 2 MG tablet Take 1 tablet (2 mg total) by mouth daily with breakfast. 90 tablet 1    lisinopril-hydrochlorothiazide (ZESTORETIC) 20-12.5 MG tablet Take 2 tablets by mouth daily. 180 tablet 1    metFORMIN (GLUCOPHAGE) 500 MG tablet Take 2 tablets (1,000 mg total) by mouth 2 (two) times daily. 360 tablet 1    pantoprazole (PROTONIX) 40 MG tablet Take 1 tablet (40 mg total) by mouth daily. 90 tablet 0    cyclobenzaprine (FLEXERIL) 5 MG tablet Take 1 tablet (5 mg total) by mouth 3 (three) times daily as needed for muscle spasms (start qhs prn due to sedation). (Patient not taking: Reported on 03/06/2023) 15 tablet 1 Not Taking  ibuprofen (ADVIL) 600 MG tablet Take 1 tablet (600 mg total) by mouth every 6 (six) hours as needed. (Patient not taking: Reported on 03/06/2023) 30 tablet 0 Not Taking   meloxicam (MOBIC) 7.5 MG tablet Take 1 tablet (7.5 mg total) by mouth daily. (Patient not taking: Reported on 03/06/2023) 30 tablet 0 Not Taking   Allergies  Allergen Reactions   Metoprolol Diarrhea    Social History   Tobacco Use   Smoking status: Former   Smokeless tobacco: Never  Substance Use Topics   Alcohol use: No    Family History   Problem Relation Age of Onset   Diabetes Other    Hypertension Other    Anesthesia problems Neg Hx    Hypotension Neg Hx    Malignant hyperthermia Neg Hx    Pseudochol deficiency Neg Hx      Review of Systems  Constitutional:  Negative for chills and fever.  Respiratory:  Negative for cough and shortness of breath.   Cardiovascular:  Negative for chest pain.  Gastrointestinal:  Negative for nausea and vomiting.  Musculoskeletal:  Positive for arthralgias.      Objective:  Physical Exam Well nourished and well developed. General: Alert and oriented x3, cooperative and pleasant, no acute distress. Head: normocephalic, atraumatic, neck supple. Eyes: EOMI.  Musculoskeletal: Left Knee: No erythema or warmth Large effusion AROM 0-120 Tender to palpation generally about the knee  Calves soft and nontender. Motor function intact in LE. Strength 5/5 LE bilaterally. Neuro: Distal pulses 2+. Sensation to light touch intact in LE.  Vital signs in last 24 hours:    Labs:   Estimated body mass index is 26.65 kg/m as calculated from the following:   Height as of 03/12/23: 6\' 1"  (1.854 m).   Weight as of 03/12/23: 91.6 kg.   Imaging Review Plain radiographs demonstrate severe degenerative joint disease of the left knee(s). The overall alignment isneutral. The bone quality appears to be adequate for age and reported activity level.      Assessment/Plan:  End stage arthritis, left knee   The patient history, physical examination, clinical judgment of the provider and imaging studies are consistent with end stage degenerative joint disease of the left knee(s) and total knee arthroplasty is deemed medically necessary. The treatment options including medical management, injection therapy arthroscopy and arthroplasty were discussed at length. The risks and benefits of total knee arthroplasty were presented and reviewed. The risks due to aseptic loosening, infection, stiffness,  patella tracking problems, thromboembolic complications and other imponderables were discussed. The patient acknowledged the explanation, agreed to proceed with the plan and consent was signed. Patient is being admitted for inpatient treatment for surgery, pain control, PT, OT, prophylactic antibiotics, VTE prophylaxis, progressive ambulation and ADL's and discharge planning. The patient is planning to be discharged home.  Rosalene Billings, PA-C Orthopedic Surgery [facility] [place] [phone]

## 2023-03-25 NOTE — Interval H&P Note (Signed)
History and Physical Interval Note:  03/25/2023 11:33 AM  Brendan Gonzales  has presented today for surgery, with the diagnosis of Left knee osteoarthritis.  The various methods of treatment have been discussed with the patient and family. After consideration of risks, benefits and other options for treatment, the patient has consented to  Procedure(s): TOTAL KNEE ARTHROPLASTY (Left) as a surgical intervention.  The patient's history has been reviewed, patient examined, no change in status, stable for surgery.  I have reviewed the patient's chart and labs.  Questions were answered to the patient's satisfaction.     [name]

## 2023-03-25 NOTE — Anesthesia Postprocedure Evaluation (Signed)
Anesthesia Post Note  Patient: Cuinn Westerhold  Procedure(s) Performed: TOTAL KNEE ARTHROPLASTY (Left: Knee)     Patient location during evaluation: PACU Anesthesia Type: Spinal and Regional Level of consciousness: oriented and awake and alert Pain management: pain level controlled Vital Signs Assessment: post-procedure vital signs reviewed and stable Respiratory status: spontaneous breathing, respiratory function stable and patient connected to nasal cannula oxygen Cardiovascular status: blood pressure returned to baseline and stable Postop Assessment: no headache, no backache, no apparent nausea or vomiting, spinal receding and patient able to bend at knees Anesthetic complications: no  No notable events documented.  Last Vitals:  Vitals:   03/25/23 1430 03/25/23 1445  BP: 129/75 118/74  Pulse: 90 77  Resp: 13 18  Temp:    SpO2: 100% 99%    Last Pain:  Vitals:   03/25/23 1423  TempSrc: Oral  PainSc: 6                  Mayling Aber,W. EDMOND

## 2023-03-25 NOTE — Anesthesia Procedure Notes (Signed)
Spinal  Patient location during procedure: OR Start time: 03/25/2023 12:46 PM End time: 03/25/2023 12:51 PM Reason for block: surgical anesthesia Staffing Performed: anesthesiologist  Anesthesiologist: Gaynelle Adu, MD Performed by: Gaynelle Adu, MD Authorized by: Gaynelle Adu, MD   Preanesthetic Checklist Completed: patient identified, IV checked, risks and benefits discussed, surgical consent, monitors and equipment checked, pre-op evaluation and timeout performed Spinal Block Patient position: sitting Prep: DuraPrep Patient monitoring: cardiac monitor, continuous pulse ox and blood pressure Approach: midline Location: L3-4 Injection technique: single-shot Needle Needle type: Pencan  Needle gauge: 24 G Needle length: 9 cm Assessment Sensory level: T8 Events: CSF return Additional Notes Functioning IV was confirmed and monitors were applied. Sterile prep and drape, including hand hygiene and sterile gloves were used. The patient was positioned and the spine was prepped. The skin was anesthetized with lidocaine.  Free flow of clear CSF was obtained prior to injecting local anesthetic into the CSF.  The spinal needle aspirated freely following injection.  The needle was carefully withdrawn.  The patient tolerated the procedure well.

## 2023-03-25 NOTE — Evaluation (Signed)
Physical Therapy Evaluation Patient Details Name: Brendan Gonzales MRN: [id number] DOB: [birth year] Today's Date: 03/25/2023  History of Present Illness  67 yo male presents to therapy s/p L TKA on 03/25/2023 due to failure of conservative measures. Pt PMH includes but is not limited to: HLD, DM II, HTN, arthritis, and angina.  Clinical Impression     Brendan Gonzales is a 67 y.o. male POD 0 s/p L TKA. Patient reports IND with mobility at baseline. Patient is now limited by functional impairments (see PT problem list below). PT arrived ~ 1700  and pt indicated his pain was 7/10 and did not feel he could do therapy. PT made nurse aware whom was able to provide pt with pain mediation. PT returned 1825 and pt stated pain 5/10 and agreeable to therapy eval. PT assessed and noted pt had poor L LE motor control and coordination and required PROM to complete SLR attributed to slow regression of anesthesia. Pt requires mod A for bed mobility and min A for L LE to lateral scoot to L seated EOB. Pt indicated 15/10 LLE pain with mobility tasks. PT unable to safely progress with transfer and gait assessment at time of eval. Patient instructed in exercise to facilitate ROM and circulation to manage edema with pt encouraged to perform ankle pumps.  Patient will benefit from continued skilled PT interventions to address impairments and progress towards PLOF. Acute PT will follow to progress mobility and stair training in preparation for safe discharge home with family support and OPPT services scheduled for 10/4.       If plan is discharge home, recommend the following: A little help with walking and/or transfers;A little help with bathing/dressing/bathroom;Assistance with cooking/housework;Assist for transportation;Help with stairs or ramp for entrance   Can travel by private vehicle        Equipment Recommendations None recommended by PT  Recommendations for Other Services       Functional Status Assessment Patient  has had a recent decline in their functional status and demonstrates the ability to make significant improvements in function in a reasonable and predictable amount of time.     Precautions / Restrictions Precautions Precautions: Knee;Fall Restrictions Weight Bearing Restrictions: No      Mobility  Bed Mobility Overal bed mobility: Needs Assistance Bed Mobility: Supine to Sit, Sit to Supine     Supine to sit: HOB elevated, Used rails, Mod assist Sit to supine: Min assist (bed flat)   General bed mobility comments: pt indicated pain 5/10 at begining of therapy session and then with mobility pt reported pain was 15/10, pt required A for LLE in  and out of bed with cues and increased time. pt limited due to slow regression of anesthesia and pain. pt required A for LLE to scoot laterally in bed to [facility] for improved posittioning in bed    Transfers                   General transfer comment: NT due to pt pain report and slow regression of anesthesia impacting pt L LE motor control and coordination    Ambulation/Gait               General Gait Details: NT for pt and staff safety  Stairs            Wheelchair Mobility     Tilt Bed    Modified Rankin (Stroke Patients Only)       Balance Overall balance assessment: Needs assistance Sitting-balance support:  Feet supported Sitting balance-Leahy Scale: Good                                       Pertinent Vitals/Pain Pain Assessment Pain Assessment: 0-10 Pain Score: 10-Worst pain ever Pain Location: L knee and leg Pain Descriptors / Indicators: Aching, Constant, Cramping, Discomfort, Grimacing, Operative site guarding, Restless Pain Intervention(s): Limited activity within patient's tolerance, Monitored during session, Premedicated before session, Repositioned, Ice applied    Home Living Family/patient expects to be discharged to:: Private residence Living Arrangements:  Spouse/significant other;Children Available Help at Discharge: Family Type of Home: House Home Access: Stairs to enter Entrance Stairs-Rails: None Entrance Stairs-Number of Steps: 2 in the back and 4 in front Alternate Level Stairs-Number of Steps: flight Home Layout: Two level (pt reports set up a bedroom downs stairs if needed) Home Equipment: Cane - single point;Rolling Walker (2 wheels) (pt reports ordering crutch)      Prior Function Prior Level of Function : Independent/Modified Independent             Mobility Comments: IND no AD for all ADLs, self care tasks       Extremity/Trunk Assessment        Lower Extremity Assessment Lower Extremity Assessment: LLE deficits/detail LLE Deficits / Details: ankle DF/PF 4/5; PROM for SLR LLE Sensation: decreased light touch    Cervical / Trunk Assessment Cervical / Trunk Assessment: Normal  Communication   Communication Communication: No apparent difficulties  Cognition Arousal: Alert Behavior During Therapy: WFL for tasks assessed/performed Overall Cognitive Status: Within Functional Limits for tasks assessed                                          General Comments      Exercises Total Joint Exercises Ankle Circles/Pumps: AROM, Both, 15 reps   Assessment/Plan    PT Assessment Patient needs continued PT services  PT Problem List Decreased strength;Decreased range of motion;Decreased activity tolerance;Decreased balance;Decreased mobility;Decreased coordination;Decreased cognition;Pain       PT Treatment Interventions DME instruction;Gait training;Stair training;Functional mobility training;Therapeutic activities;Therapeutic exercise;Balance training;Neuromuscular re-education;Patient/family education;Modalities    PT Goals (Current goals can be found in the Care Plan section)  Acute Rehab PT Goals Patient Stated Goal: to be able to move without pain PT Goal Formulation: With patient Time For  Goal Achievement: 04/08/23 Potential to Achieve Goals: Good    Frequency 7X/week     Co-evaluation               AM-PAC PT "6 Clicks" Mobility  Outcome Measure Help needed turning from your back to your side while in a flat bed without using bedrails?: A Little Help needed moving from lying on your back to sitting on the side of a flat bed without using bedrails?: A Little Help needed moving to and from a bed to a chair (including a wheelchair)?: Total Help needed standing up from a chair using your arms (e.g., wheelchair or bedside chair)?: Total Help needed to walk in hospital room?: Total Help needed climbing 3-5 steps with a railing? : Total 6 Click Score: 10    End of Session   Activity Tolerance: Patient limited by pain;Treatment limited secondary to medical complications (Comment) (slow regression of anesthesia) Patient left: in bed;with call bell/phone within reach;with bed alarm set;with  family/visitor present;with nursing/sitter in room Nurse Communication: Mobility status PT Visit Diagnosis: Unsteadiness on feet (R26.81);Other abnormalities of gait and mobility (R26.89);Muscle weakness (generalized) (M62.81);Difficulty in walking, not elsewhere classified (R26.2);Pain Pain - Right/Left: Left Pain - part of body: Knee;Leg    Time: 7846-9629 PT Time Calculation (min) (ACUTE ONLY): 23 min   Charges:   PT Evaluation $PT Eval Low Complexity: 1 Low PT Treatments $Therapeutic Activity: 8-22 mins PT General Charges $$ ACUTE PT VISIT: 1 Visit         Johnny Bridge, PT Acute Rehab   Jacqualyn Posey 03/25/2023, 7:12 PM

## 2023-03-25 NOTE — Anesthesia Procedure Notes (Signed)
Anesthesia Regional Block: Adductor canal block   Pre-Anesthetic Checklist: , timeout performed,  Correct Patient, Correct Site, Correct Laterality,  Correct Procedure, Correct Position, site marked,  Risks and benefits discussed,  Pre-op evaluation,  At surgeon's request and post-op pain management  Laterality: Left  Prep: Maximum Sterile Barrier Precautions used, chloraprep       Needles:  Injection technique: Single-shot  Needle Type: Echogenic Stimulator Needle     Needle Length: 9cm  Needle Gauge: 21     Additional Needles:   Procedures:,,,, ultrasound used (permanent image in chart),,    Narrative:  Start time: 03/25/2023 12:07 PM End time: 03/25/2023 12:17 PM Injection made incrementally with aspirations every 5 mL.  Performed by: Personally  Anesthesiologist: Gaynelle Adu, MD

## 2023-03-25 NOTE — Transfer of Care (Signed)
Immediate Anesthesia Transfer of Care Note  Patient: Brendan Gonzales  Procedure(s) Performed: TOTAL KNEE ARTHROPLASTY (Left: Knee)  Patient Location: PACU  Anesthesia Type:MAC and MAC combined with regional for post-op pain  Level of Consciousness: drowsy and patient cooperative  Airway & Oxygen Therapy: Patient Spontanous Breathing and Patient connected to face mask oxygen  Post-op Assessment: Report given to RN and Post -op Vital signs reviewed and stable  Post vital signs: Reviewed and stable  Last Vitals:  Vitals Value Taken Time  BP 114/71 03/25/23 1425  Temp    Pulse 91 03/25/23 1425  Resp 12 03/25/23 1425  SpO2 100 % 03/25/23 1425  Vitals shown include unfiled device data.  Last Pain:  Vitals:   03/25/23 1220  TempSrc:   PainSc: 0-No pain      Patients Stated Pain Goal: 5 (03/25/23 1111)  Complications: No notable events documented.

## 2023-03-25 NOTE — Plan of Care (Signed)

## 2023-03-25 NOTE — Anesthesia Preprocedure Evaluation (Addendum)
Anesthesia Evaluation  Patient identified by MRN, date of birth, ID band Patient awake    Reviewed: Allergy & Precautions, H&P , NPO status , Patient's Chart, lab work & pertinent test results  Airway Mallampati: II  TM Distance: >3 FB Neck ROM: Full    Dental no notable dental hx. (+) Edentulous Upper, Partial Lower, Dental Advisory Given   Pulmonary former smoker   Pulmonary exam normal breath sounds clear to auscultation       Cardiovascular hypertension, Pt. on medications  Rhythm:Regular Rate:Normal     Neuro/Psych  Headaches  negative psych ROS   GI/Hepatic negative GI ROS, Neg liver ROS,,,  Endo/Other  diabetes, Type 2, Oral Hypoglycemic Agents    Renal/GU negative Renal ROS  negative genitourinary   Musculoskeletal  (+) Arthritis , Osteoarthritis,    Abdominal   Peds  Hematology negative hematology ROS (+)   Anesthesia Other Findings   Reproductive/Obstetrics negative OB ROS                             Anesthesia Physical Anesthesia Plan  ASA: 2  Anesthesia Plan: Spinal   Post-op Pain Management: Regional block* and Tylenol PO (pre-op)*   Induction: Intravenous  PONV Risk Score and Plan: 2 and Ondansetron, Dexamethasone, Propofol infusion and Midazolam  Airway Management Planned: Natural Airway and Simple Face Mask  Additional Equipment:   Intra-op Plan:   Post-operative Plan:   Informed Consent: I have reviewed the patients History and Physical, chart, labs and discussed the procedure including the risks, benefits and alternatives for the proposed anesthesia with the patient or authorized representative who has indicated his/her understanding and acceptance.     Dental advisory given  Plan Discussed with: CRNA  Anesthesia Plan Comments:        Anesthesia Quick Evaluation

## 2023-03-25 NOTE — Op Note (Signed)
NAME:  Brendan Gonzales                      MEDICAL RECORD NO.:  [id number]                             FACILITY:  [facility]      PHYSICIAN:  Madlyn Frankel. Charlann Boxer, M.D.  DATE OF BIRTH:  11-Jan-1956      DATE OF PROCEDURE:  03/25/2023                                     OPERATIVE REPORT         PREOPERATIVE DIAGNOSIS:  Left knee osteoarthritis.      POSTOPERATIVE DIAGNOSIS:  Left knee osteoarthritis.      FINDINGS:  The patient was noted to have complete loss of cartilage and   bone-on-bone arthritis with associated osteophytes in the medial and patellofemoral compartments of   the knee with a significant synovitis and associated effusion.  The patient had failed months of conservative treatment including medications, injection therapy, activity modification.     PROCEDURE:  Left total knee replacement.      COMPONENTS USED:  DePuy Attune FB CR MS knee   system, a size 8 femur, 7 tibia, size 5 mm CR MS AOX insert, and 38 anatomic patellar   button.      SURGEON:  Madlyn Frankel. Charlann Boxer, M.D.      ASSISTANT:  Rosalene Billings, PA-C.      ANESTHESIA:  Regional and Spinal.      SPECIMENS:  None.      COMPLICATION:  None.      DRAINS:  None.  EBL: <200 cc      TOURNIQUET TIME:  30 min at 225 mmHg     The patient was stable to the recovery room.      INDICATION FOR PROCEDURE:  Brendan Gonzales is a 67 y.o. male patient of   mine.  The patient had been seen, evaluated, and treated for months conservatively in the   office with medication, activity modification, and injections.  The patient had   radiographic changes of bone-on-bone arthritis with endplate sclerosis and osteophytes noted.  Based on the radiographic changes and failed conservative measures, the patient   decided to proceed with definitive treatment, total knee replacement.  Risks of infection, DVT, component failure, need for revision surgery, neurovascular injury were reviewed in the office setting.  The postop course was reviewed  stressing the efforts to maximize post-operative satisfaction and function.  Consent was obtained for benefit of pain   relief.      PROCEDURE IN DETAIL:  The patient was brought to the operative theater.   Once adequate anesthesia, preoperative antibiotics, 2 gm of Ancef,1 gm of Tranexamic Acid, and 10 mg of Decadron administered, the patient was positioned supine with a left thigh tourniquet placed.  The  left lower extremity was prepped and draped in sterile fashion.  A time-   out was performed identifying the patient, planned procedure, and the appropriate extremity.      The left lower extremity was placed in the [facility] leg holder.  The leg was   exsanguinated, tourniquet elevated to 225 mmHg.  A midline incision was   made followed by median parapatellar arthrotomy.  Following initial   exposure, attention was first directed  to the patella.  Precut   measurement was noted to be 26 mm.  I resected down to 14 mm and used a   38 anatomic patellar button to restore patellar height as well as cover the cut surface.      The lug holes were drilled and a metal shim was placed to protect the   patella from retractors and saw blade during the procedure.      At this point, attention was now directed to the femur.  The femoral   canal was opened with a drill, irrigated to try to prevent fat emboli.  An   intramedullary rod was passed at 5 degrees valgus, 9 mm of bone was   resected off the distal femur.  Following this resection, the tibia was   subluxated anteriorly.  Using the extramedullary guide, 3 mm of bone was resected off   the proximal medial tibia.  We confirmed the gap would be   stable medially and laterally with a size 5 spacer block as well as confirmed that the tibial cut was perpendicular in the coronal plane, checking with an alignment rod.      Once this was done, I sized the femur to be a size 8 in the anterior-   posterior dimension, chose a standard component based on  medial and   lateral dimension.  The size 8 rotation block was then pinned in   position anterior referenced using the C-clamp to set rotation.  The   anterior, posterior, and  chamfer cuts were made without difficulty nor   notching making certain that I was along the anterior cortex to help   with flexion gap stability.      The final box cut was made off the lateral aspect of distal femur.      At this point, the tibia was sized to be a size 7.  The size 7 tray was   then pinned in position through the medial third of the tubercle,   drilled, and keel punched.  Trial reduction was now carried with a 8 femur,  7 tibia, a size 5 mm CR MS insert, and the 38 anatomic patella botton.  The knee was brought to full extension with good flexion stability with the patella   tracking through the trochlea without application of pressure.  Given   all these findings the trial components removed.  Final components were   opened and cement was mixed.  The knee was irrigated with normal saline solution and pulse lavage.  The synovial lining was   then injected with 30 cc of 0.25% Marcaine with epinephrine, 1 cc of Toradol and 30 cc of NS for a total of 61 cc.     Final implants were then cemented onto cleaned and dried cut surfaces of bone with the knee brought to extension with a size 5 mm CR MS trial insert.      Once the cement had fully cured, excess cement was removed   throughout the knee.  I confirmed that I was satisfied with the range of   motion and stability, and the final size 5 mm CR MS AOX insert was chosen.  It was   placed into the knee.      The tourniquet had been let down at 30 minutes.  No significant   hemostasis was required.  The extensor mechanism was then reapproximated using #1 Vicryl and #1 Stratafix sutures with the knee   in flexion.  The  remaining wound was closed with 2-0 Vicryl and running 4-0 Monocryl.   The knee was cleaned, dried, dressed sterilely using Dermabond  and   Aquacel dressing.  The patient was then   brought to recovery room in stable condition, tolerating the procedure   well.   Please note that Physician Assistant, Rosalene Billings, PA-C was present for the entirety of the case, and was utilized for pre-operative positioning, peri-operative retractor management, general facilitation of the procedure and for primary wound closure at the end of the case.              Madlyn Frankel Charlann Boxer, M.D.    03/25/2023 11:34 AM

## 2023-03-25 NOTE — Discharge Instructions (Signed)

## 2023-03-25 NOTE — Care Plan (Signed)
Ortho Bundle Case Management Note  Patient Details  Name: Brendan Gonzales MRN: [id number] Date of Birth: 27-Feb-1956                  L TKA on 03/25/23.  DCP: Home with sister and brother.  DME: No needs. Has RW.  PT: EO 10/4 at 12:00pm.   DME Arranged:  N/A DME Agency:      Additional Comments: Please contact me with any questions of if this plan should need to change.    Despina Pole, CCM Case Manager, [facility]  [phone] 03/25/2023, 4:57 PM

## 2023-03-25 NOTE — Anesthesia Procedure Notes (Signed)
Procedure Name: General with mask airway Date/Time: 03/25/2023 12:52 PM  Performed by: Lily Lovings, CRNAPre-anesthesia Checklist: Patient identified, Emergency Drugs available, Suction available, Patient being monitored and Timeout performed Patient Re-evaluated:Patient Re-evaluated prior to induction Oxygen Delivery Method: Simple face mask Preoxygenation: Pre-oxygenation with 100% oxygen Induction Type: IV induction

## 2023-03-26 ENCOUNTER — Encounter (HOSPITAL_COMMUNITY): Payer: Self-pay | Admitting: Orthopedic Surgery

## 2023-03-26 ENCOUNTER — Other Ambulatory Visit (HOSPITAL_COMMUNITY): Payer: Self-pay

## 2023-03-26 DIAGNOSIS — M1712 Unilateral primary osteoarthritis, left knee: Secondary | ICD-10-CM | POA: Diagnosis not present

## 2023-03-26 LAB — CBC
HCT: 29 % — ABNORMAL LOW (ref 39.0–52.0)
Hemoglobin: 9.4 g/dL — ABNORMAL LOW (ref 13.0–17.0)
MCH: 25.9 pg — ABNORMAL LOW (ref 26.0–34.0)
MCHC: 32.4 g/dL (ref 30.0–36.0)
MCV: 79.9 fL — ABNORMAL LOW (ref 80.0–100.0)
Platelets: 253 10*3/uL (ref 150–400)
RBC: 3.63 MIL/uL — ABNORMAL LOW (ref 4.22–5.81)
RDW: 15.3 % (ref 11.5–15.5)
WBC: 8.8 10*3/uL (ref 4.0–10.5)
nRBC: 0 % (ref 0.0–0.2)

## 2023-03-26 LAB — GLUCOSE, CAPILLARY
Glucose-Capillary: 152 mg/dL — ABNORMAL HIGH (ref 70–99)
Glucose-Capillary: 202 mg/dL — ABNORMAL HIGH (ref 70–99)
Glucose-Capillary: 239 mg/dL — ABNORMAL HIGH (ref 70–99)
Glucose-Capillary: 226 mg/dL — ABNORMAL HIGH (ref 70–99)
Glucose-Capillary: 215 mg/dL — ABNORMAL HIGH (ref 70–99)

## 2023-03-26 LAB — BASIC METABOLIC PANEL
Anion gap: 10 (ref 5–15)
BUN: 11 mg/dL (ref 8–23)
CO2: 25 mmol/L (ref 22–32)
Calcium: 8.1 mg/dL — ABNORMAL LOW (ref 8.9–10.3)
Chloride: 98 mmol/L (ref 98–111)
Creatinine, Ser: 0.76 mg/dL (ref 0.61–1.24)
GFR, Estimated: >60 mL/min (ref >60)
Glucose, Bld: 263 mg/dL — ABNORMAL HIGH (ref 70–99)
Potassium: 3.9 mmol/L (ref 3.5–5.1)
Sodium: 133 mmol/L — ABNORMAL LOW (ref 135–145)

## 2023-03-26 MED ORDER — OXYCODONE HCL 5 MG PO TABS
5.0000 mg | ORAL_TABLET | ORAL | 0 refills | Status: DC | PRN
  Filled 2023-03-26: qty 42, 7d supply, fill #0

## 2023-03-26 MED ORDER — POLYETHYLENE GLYCOL 3350 17 GM/SCOOP PO POWD
17.0000 g | Freq: Two times a day (BID) | ORAL | 0 refills | Status: AC
  Filled 2023-03-26: qty 238, 7d supply, fill #0

## 2023-03-26 MED ORDER — CEFADROXIL 500 MG PO CAPS
500.0000 mg | ORAL_CAPSULE | Freq: Two times a day (BID) | ORAL | 0 refills | Status: AC
  Filled 2023-03-26: qty 14, 7d supply, fill #0

## 2023-03-26 MED ORDER — SENNA 8.6 MG PO TABS
2.0000 | ORAL_TABLET | Freq: Every day | ORAL | 0 refills | Status: AC
  Filled 2023-03-26: qty 28, 14d supply, fill #0

## 2023-03-26 MED ORDER — KETOROLAC TROMETHAMINE 15 MG/ML IJ SOLN
7.5000 mg | Freq: Four times a day (QID) | INTRAMUSCULAR | Status: AC
  Administered 2023-03-26: 7.5 mg via INTRAVENOUS
  Filled 2023-03-26: qty 1

## 2023-03-26 MED ORDER — KETOROLAC TROMETHAMINE 15 MG/ML IJ SOLN
7.5000 mg | Freq: Once | INTRAMUSCULAR | Status: AC
  Administered 2023-03-26: 7.5 mg via INTRAVENOUS
  Filled 2023-03-26: qty 1

## 2023-03-26 MED ORDER — METHOCARBAMOL 500 MG PO TABS
500.0000 mg | ORAL_TABLET | Freq: Four times a day (QID) | ORAL | 2 refills | Status: AC | PRN
  Filled 2023-03-26: qty 40, 10d supply, fill #0
  Filled 2023-04-15 – 2023-04-16 (×2): qty 40, 10d supply, fill #1

## 2023-03-26 MED ORDER — ASPIRIN 81 MG PO CHEW
81.0000 mg | CHEWABLE_TABLET | Freq: Two times a day (BID) | ORAL | 0 refills | Status: AC
  Filled 2023-03-26: qty 56, 28d supply, fill #0

## 2023-03-26 MED ORDER — MELOXICAM 15 MG PO TABS
15.0000 mg | ORAL_TABLET | Freq: Every day | ORAL | 0 refills | Status: AC
  Filled 2023-03-26: qty 30, 30d supply, fill #0

## 2023-03-26 NOTE — Progress Notes (Signed)
Physical Therapy Treatment Patient Details Name: Brendan Gonzales MRN: [id number] DOB: 09/14/1955 Today's Date: 03/26/2023   History of Present Illness 67 yo male presents to therapy s/p L TKA on 03/25/2023 due to failure of conservative measures. Pt PMH includes but is not limited to: HLD, DM II, HTN, arthritis, and angina.    PT Comments  Pt with improved quad control and pain control.  He was able to ambulate 87' with RW and antalgic pattern.  He did have increased diaphoresis toward the end of the walk but all VSS and denied dizziness.  Will f/u in afternoon to further progress and try stairs.     If plan is discharge home, recommend the following: A little help with walking and/or transfers;A little help with bathing/dressing/bathroom;Assistance with cooking/housework;Assist for transportation;Help with stairs or ramp for entrance   Can travel by private vehicle        Equipment Recommendations  None recommended by PT    Recommendations for Other Services       Precautions / Restrictions Precautions Precautions: Knee;Fall Restrictions Weight Bearing Restrictions: Yes LLE Weight Bearing: Weight bearing as tolerated     Mobility  Bed Mobility Overal bed mobility: Needs Assistance Bed Mobility: Supine to Sit     Supine to sit: Min assist     General bed mobility comments: Min A for L LE; cues for scooting forward    Transfers Overall transfer level: Needs assistance Equipment used: Rolling walker (2 wheels) Transfers: Sit to/from Stand Sit to Stand: Contact guard assist           General transfer comment: Cues for hand placement    Ambulation/Gait Ambulation/Gait assistance: Contact guard assist Gait Distance (Feet): 60 Feet Assistive device: Rolling walker (2 wheels) Gait Pattern/deviations: Step-to pattern, Decreased stride length Gait velocity: decreased     General Gait Details: Cues for sequencing and posture; small steps but L knee stable   Stairs              Wheelchair Mobility     Tilt Bed    Modified Rankin (Stroke Patients Only)       Balance Overall balance assessment: Needs assistance Sitting-balance support: Feet supported Sitting balance-Leahy Scale: Good     Standing balance support: Bilateral upper extremity supported, Reliant on assistive device for balance Standing balance-Leahy Scale: Poor Standing balance comment: Steady with RW                            Cognition Arousal: Alert Behavior During Therapy: WFL for tasks assessed/performed Overall Cognitive Status: Within Functional Limits for tasks assessed                                          Exercises Total Joint Exercises Ankle Circles/Pumps: AROM, Both, 15 reps Long Arc Quad: AAROM, Left, 10 reps, Seated Knee Flexion: AAROM, Left, 10 reps, Seated Goniometric ROM: L knee ~5 to 30 degrees    General Comments General comments (skin integrity, edema, etc.): VSS      Pertinent Vitals/Pain Pain Assessment Pain Assessment: 0-10 Pain Score: 5  Pain Location: L knee and leg Pain Descriptors / Indicators: Discomfort, Grimacing, Operative site guarding Pain Intervention(s): Limited activity within patient's tolerance, Monitored during session, Premedicated before session    Home Living  Prior Function            PT Goals (current goals can now be found in the care plan section) Progress towards PT goals: Progressing toward goals    Frequency    7X/week      PT Plan      Co-evaluation              AM-PAC PT "6 Clicks" Mobility   Outcome Measure  Help needed turning from your back to your side while in a flat bed without using bedrails?: A Little Help needed moving from lying on your back to sitting on the side of a flat bed without using bedrails?: A Little Help needed moving to and from a bed to a chair (including a wheelchair)?: A Little Help needed  standing up from a chair using your arms (e.g., wheelchair or bedside chair)?: A Little Help needed to walk in hospital room?: A Little Help needed climbing 3-5 steps with a railing? : A Little 6 Click Score: 18    End of Session Equipment Utilized During Treatment: Gait belt Activity Tolerance: Patient tolerated treatment well Patient left: with call bell/phone within reach;with family/visitor present;with nursing/sitter in room;in chair Nurse Communication: Mobility status PT Visit Diagnosis: Unsteadiness on feet (R26.81);Other abnormalities of gait and mobility (R26.89);Muscle weakness (generalized) (M62.81);Difficulty in walking, not elsewhere classified (R26.2);Pain Pain - Right/Left: Left Pain - part of body: Knee;Leg     Time: 4098-1191 PT Time Calculation (min) (ACUTE ONLY): 24 min  Charges:    $Gait Training: 8-22 mins $Therapeutic Exercise: 8-22 mins PT General Charges $$ ACUTE PT VISIT: 1 Visit                     Anise Salvo, PT Acute Rehab Services [facility] [phone]    Rayetta Humphrey 03/26/2023, 1:12 PM

## 2023-03-26 NOTE — Plan of Care (Signed)
  Problem: Pain Managment: Goal: General experience of comfort will improve 03/26/2023 0806 by Penelope Galas, RN Outcome: Progressing 03/26/2023 0805 by Penelope Galas, RN Outcome: Progressing   Problem: Safety: Goal: Ability to remain free from injury will improve 03/26/2023 0806 by Penelope Galas, RN Outcome: Progressing 03/26/2023 0805 by Penelope Galas, RN Outcome: Progressing

## 2023-03-26 NOTE — TOC Transition Note (Signed)
Transition of Care [facility]) - CM/SW Discharge Note   Patient Details  Name: Brendan Gonzales MRN: [id number] Date of Birth: 06-Aug-1955  [facility] of [facility] CM/SW Contact:  Amada Jupiter, LCSW Phone Number: 03/26/2023, 10:01 AM   Clinical Narrative:     Met with pt who confirms he has needed DME in the home.  OPPT already arranged with [facility].  No further TOC needs.  Final next level of care: OP Rehab Barriers to Discharge: No Barriers Identified   Patient Goals and CMS Choice      Discharge Placement                         Discharge Plan and Services Additional resources added to the After Visit Summary for                  DME Arranged: N/A                    Social Determinants of Health (SDOH) Interventions SDOH Screenings   Food Insecurity: No Food Insecurity (03/25/2023)  Housing: Low Risk  (03/25/2023)  Transportation Needs: No Transportation Needs (03/25/2023)  Utilities: Not At Risk (03/25/2023)  Alcohol Screen: Low Risk  (03/19/2023)  Depression (PHQ2-9): Low Risk  (03/19/2023)  Financial Resource Strain: Low Risk  (03/19/2023)  Physical Activity: Insufficiently Active (03/19/2023)  Social Connections: Moderately Integrated (03/19/2023)  Stress: Stress Concern Present (03/19/2023)  Tobacco Use: Medium Risk (03/25/2023)  Health Literacy: Adequate Health Literacy (03/19/2023)     Readmission Risk Interventions     No data to display

## 2023-03-26 NOTE — Plan of Care (Signed)
  Problem: Nutrition: Goal: Adequate nutrition will be maintained Outcome: Progressing   Problem: Pain Managment: Goal: General experience of comfort will improve Outcome: Progressing   Problem: Safety: Goal: Ability to remain free from injury will improve Outcome: Progressing   

## 2023-03-26 NOTE — Care Management Obs Status (Signed)
MEDICARE OBSERVATION STATUS NOTIFICATION   Patient Details  Name: Brendan Gonzales MRN: [id number] Date of Birth: [birth year]   Medicare Observation Status Notification Given:  Hart Robinsons, LCSW 03/26/2023, 2:55 PM

## 2023-03-26 NOTE — Progress Notes (Signed)
   Subjective: 1 Day Post-Op Procedure(s) (LRB): TOTAL KNEE ARTHROPLASTY (Left) Patient reports pain as mild.   Patient seen in rounds with Dr. Charlann Boxer. Patient is well, and has had no acute complaints or problems. No acute events overnight. Foley catheter removed. Patient was limited with PT secondary to pain.  We will start therapy today.   Objective: Vital signs in last 24 hours: Temp:  [97.7 F (36.5 C)-98.1 F (36.7 C)] 97.7 F (36.5 C) (10/02 0614) Pulse Rate:  [64-99] 78 (10/02 0614) Resp:  [10-20] 17 (10/02 0614) BP: (114-184)/(71-93) 154/79 (10/02 0614) SpO2:  [93 %-100 %] 97 % (10/02 0614) Weight:  [91.6 kg] 91.6 kg (10/01 1111)  Intake/Output from previous day:  Intake/Output Summary (Last 24 hours) at 03/26/2023 0812 Last data filed at 03/26/2023 0622 Gross per 24 hour  Intake 2789.33 ml  Output 2150 ml  Net 639.33 ml     Intake/Output this shift: No intake/output data recorded.  Labs: Recent Labs    03/26/23 0333  HGB 9.4*   Recent Labs    03/26/23 0333  WBC 8.8  RBC 3.63*  HCT 29.0*  PLT 253   Recent Labs    03/26/23 0333  NA 133*  K 3.9  CL 98  CO2 25  BUN 11  CREATININE 0.76  GLUCOSE 263*  CALCIUM 8.1*   No results for input(s): "LABPT", "INR" in the last 72 hours.  Exam: General - Patient is Alert and Oriented Extremity - Neurologically intact Sensation intact distally Intact pulses distally Dorsiflexion/Plantar flexion intact Dressing - dressing C/D/I Motor Function - intact, moving foot and toes well on exam.   Past Medical History:  Diagnosis Date   Arthritis    Constipation    Diabetes mellitus    type 2   Dizziness    Headache(784.0)    Hyperlipidemia    Hypertension    benign    Assessment/Plan: 1 Day Post-Op Procedure(s) (LRB): TOTAL KNEE ARTHROPLASTY (Left) Principal Problem:   S/P total knee arthroplasty, left  Estimated body mass index is 27.4 kg/m as calculated from the following:   Height as of this  encounter: 6' (1.829 m).   Weight as of this encounter: 91.6 kg. Advance diet Up with therapy D/C IV fluids   Patient's anticipated LOS is less than 2 midnights, meeting these requirements: - Younger than 22 - Lives within 1 hour of care - Has a competent adult at home to recover with post-op recover - NO history of  - Chronic pain requiring opiods  - Diabetes  - Coronary Artery Disease  - Heart failure  - Heart attack  - Stroke  - DVT/VTE  - Cardiac arrhythmia  - Respiratory Failure/COPD  - Renal failure  - Anemia  - Advanced Liver disease     DVT Prophylaxis - Aspirin Weight bearing as tolerated.  Hgb stable at 9.4 this AM.  Will give one dose of IV toradol today for additional pain control.   Plan is to go Home after hospital stay. Plan for discharge today following 1-2 sessions of PT as long as they are meeting their goals. Patient is scheduled for OPPT. Follow up in the office in 2 weeks.   Rosalene Billings, PA-C Orthopedic Surgery [phone] 03/26/2023, 8:12 AM

## 2023-03-26 NOTE — Progress Notes (Signed)
Physical Therapy Treatment Patient Details Name: Brendan Gonzales MRN: [id number] DOB: 1956-06-17 Today's Date: 03/26/2023   History of Present Illness 67 yo male presents to therapy s/p L TKA on 03/25/2023 due to failure of conservative measures. Pt PMH includes but is not limited to: HLD, DM II, HTN, arthritis, and angina.    PT Comments  Pt limited by pain.  He did ambulate 19' with RW but at slow speed, small steps, and low foot clearance.  He had too much pain and not enough foot clearance to attempt stairs and he has 2 to enter without rails.  Pt also expressing concern about elevated glucose - educated likely from steroids.  With poor pain control and not able to do stairs - continue to recommend further therapy prior to return home.  Pt in agreement.     If plan is discharge home, recommend the following: A little help with walking and/or transfers;A little help with bathing/dressing/bathroom;Assistance with cooking/housework;Assist for transportation;Help with stairs or ramp for entrance   Can travel by private vehicle        Equipment Recommendations  None recommended by PT    Recommendations for Other Services       Precautions / Restrictions Precautions Precautions: Knee;Fall Restrictions Weight Bearing Restrictions: Yes LLE Weight Bearing: Weight bearing as tolerated     Mobility  Bed Mobility Overal bed mobility: Needs Assistance Bed Mobility: Sit to Supine     Supine to sit: Min assist Sit to supine: Mod assist   General bed mobility comments: Assist for bil LE due to pain    Transfers Overall transfer level: Needs assistance Equipment used: Rolling walker (2 wheels) Transfers: Sit to/from Stand Sit to Stand: Min assist           General transfer comment: Cues for hand placement and L LE management; min A to rise from recliner and with return to sit min A to move L leg forward for pain control    Ambulation/Gait Ambulation/Gait assistance: Contact  guard assist Gait Distance (Feet): 60 Feet Assistive device: Rolling walker (2 wheels) Gait Pattern/deviations: Step-to pattern, Decreased stride length, Decreased stance time - left, Decreased weight shift to left Gait velocity: decreased     General Gait Details: Small steps, very low foot clearence, 3 standing rest breaks due to pain; cues for RW proximity   Stairs Stairs:  (held due to pain)           Wheelchair Mobility     Tilt Bed    Modified Rankin (Stroke Patients Only)       Balance Overall balance assessment: Needs assistance Sitting-balance support: Feet supported Sitting balance-Leahy Scale: Good     Standing balance support: Bilateral upper extremity supported, Reliant on assistive device for balance Standing balance-Leahy Scale: Poor Standing balance comment: Steady with RW                            Cognition Arousal: Alert Behavior During Therapy: WFL for tasks assessed/performed Overall Cognitive Status: Within Functional Limits for tasks assessed                                          Exercises Total Joint Exercises Ankle Circles/Pumps: AROM, Both, 15 reps Quad Sets: AROM, Both, 10 reps, Supine Heel Slides:  (held due to pain) Hip ABduction/ADduction: AAROM, Left, 10 reps,  Supine Long Arc Quad: AAROM, Left, 10 reps, Seated Knee Flexion: AAROM, Left, 10 reps, Seated Goniometric ROM: L knee ~10 to 45 degrees    General Comments General comments (skin integrity, edema, etc.): VSS.  Educated on safe ice use, no pivots, importance of resting with leg straight. Also, encouraged walking every 1-2 hours during day. Educated on HEP with focus on mobility the first weeks. Discussed doing exercises within pain control and if pain increasing could decreased ROM, reps, and stop exercises as needed. Encouraged to perform quad sets and ankle pumps frequently for blood flow and to promote full knee extension.       Pertinent Vitals/Pain Pain Assessment Pain Assessment: 0-10 Pain Score: 8  Pain Location: L knee and leg Pain Descriptors / Indicators: Discomfort, Grimacing, Operative site guarding Pain Intervention(s): Limited activity within patient's tolerance, Monitored during session, Premedicated before session, RN gave pain meds during session, Ice applied    Home Living                          Prior Function            PT Goals (current goals can now be found in the care plan section) Progress towards PT goals: Progressing toward goals    Frequency    7X/week      PT Plan      Co-evaluation              AM-PAC PT "6 Clicks" Mobility   Outcome Measure  Help needed turning from your back to your side while in a flat bed without using bedrails?: A Little Help needed moving from lying on your back to sitting on the side of a flat bed without using bedrails?: A Little Help needed moving to and from a bed to a chair (including a wheelchair)?: A Little Help needed standing up from a chair using your arms (e.g., wheelchair or bedside chair)?: A Little Help needed to walk in hospital room?: A Little Help needed climbing 3-5 steps with a railing? : A Lot 6 Click Score: 17    End of Session Equipment Utilized During Treatment: Gait belt Activity Tolerance: Patient limited by pain Patient left: in bed;with call bell/phone within reach;with bed alarm set;with SCD's reapplied;with family/visitor present Nurse Communication: Mobility status;Other (comment) (needs further PT) PT Visit Diagnosis: Unsteadiness on feet (R26.81);Other abnormalities of gait and mobility (R26.89);Muscle weakness (generalized) (M62.81);Difficulty in walking, not elsewhere classified (R26.2);Pain Pain - Right/Left: Left Pain - part of body: Knee;Leg     Time: 1331-1402 PT Time Calculation (min) (ACUTE ONLY): 31 min  Charges:    $Gait Training: 8-22 mins $Therapeutic Exercise: 8-22  mins PT General Charges $$ ACUTE PT VISIT: 1 Visit                     Anise Salvo, PT Acute Rehab Services [facility] [phone]    Rayetta Humphrey 03/26/2023, 2:09 PM

## 2023-03-27 ENCOUNTER — Other Ambulatory Visit (HOSPITAL_COMMUNITY): Payer: Self-pay

## 2023-03-27 DIAGNOSIS — M1712 Unilateral primary osteoarthritis, left knee: Secondary | ICD-10-CM | POA: Diagnosis not present

## 2023-03-27 LAB — CBC
HCT: 27.2 % — ABNORMAL LOW (ref 39.0–52.0)
Hemoglobin: 8.4 g/dL — ABNORMAL LOW (ref 13.0–17.0)
MCH: 25.1 pg — ABNORMAL LOW (ref 26.0–34.0)
MCHC: 30.9 g/dL (ref 30.0–36.0)
MCV: 81.2 fL (ref 80.0–100.0)
Platelets: 246 10*3/uL (ref 150–400)
RBC: 3.35 MIL/uL — ABNORMAL LOW (ref 4.22–5.81)
RDW: 15.7 % — ABNORMAL HIGH (ref 11.5–15.5)
WBC: 10.5 10*3/uL (ref 4.0–10.5)
nRBC: 0 % (ref 0.0–0.2)

## 2023-03-27 LAB — GLUCOSE, CAPILLARY: Glucose-Capillary: 153 mg/dL — ABNORMAL HIGH (ref 70–99)

## 2023-03-27 MED ORDER — KETOROLAC TROMETHAMINE 15 MG/ML IJ SOLN
7.5000 mg | Freq: Once | INTRAMUSCULAR | Status: AC
  Administered 2023-03-27: 7.5 mg via INTRAVENOUS
  Filled 2023-03-27: qty 1

## 2023-03-27 NOTE — Progress Notes (Signed)
   Subjective: 2 Days Post-Op Procedure(s) (LRB): TOTAL KNEE ARTHROPLASTY (Left) Patient reports pain as mild.   Patient seen in rounds for Dr. Charlann Boxer. Patient is well, and has had no acute complaints or problems. No acute events overnight. Voiding without difficulty. Ambulated well with PT yesterday, but felt he had increased pain due to the activity. We will continue therapy today.   Objective: Vital signs in last 24 hours: Temp:  [97.6 F (36.4 C)-98.8 F (37.1 C)] 98.8 F (37.1 C) (10/03 0603) Pulse Rate:  [73-85] 73 (10/03 0603) Resp:  [17-18] 17 (10/03 0603) BP: (134-162)/(72-103) 158/103 (10/03 0603) SpO2:  [96 %-100 %] 98 % (10/03 0603)  Intake/Output from previous day:  Intake/Output Summary (Last 24 hours) at 03/27/2023 0714 Last data filed at 03/26/2023 1101 Gross per 24 hour  Intake 360 ml  Output 400 ml  Net -40 ml     Intake/Output this shift: No intake/output data recorded.  Labs: Recent Labs    03/26/23 0333 03/27/23 0319  HGB 9.4* 8.4*   Recent Labs    03/26/23 0333 03/27/23 0319  WBC 8.8 10.5  RBC 3.63* 3.35*  HCT 29.0* 27.2*  PLT 253 246   Recent Labs    03/26/23 0333  NA 133*  K 3.9  CL 98  CO2 25  BUN 11  CREATININE 0.76  GLUCOSE 263*  CALCIUM 8.1*   No results for input(s): "LABPT", "INR" in the last 72 hours.  Exam: General - Patient is Alert and Oriented Extremity - Neurologically intact Sensation intact distally Intact pulses distally Dorsiflexion/Plantar flexion intact Dressing - dressing C/D/I Motor Function - intact, moving foot and toes well on exam.   Past Medical History:  Diagnosis Date   Arthritis    Constipation    Diabetes mellitus    type 2   Dizziness    Headache(784.0)    Hyperlipidemia    Hypertension    benign    Assessment/Plan: 2 Days Post-Op Procedure(s) (LRB): TOTAL KNEE ARTHROPLASTY (Left) Principal Problem:   S/P total knee arthroplasty, left  Estimated body mass index is 27.4 kg/m as  calculated from the following:   Height as of this encounter: 6' (1.829 m).   Weight as of this encounter: 91.6 kg. Advance diet Up with therapy D/C IV fluids   Patient's anticipated LOS is less than 2 midnights, meeting these requirements: - Younger than 42 - Lives within 1 hour of care - Has a competent adult at home to recover with post-op recover - NO history of  - Chronic pain requiring opiods  - Diabetes  - Coronary Artery Disease  - Heart failure  - Heart attack  - Stroke  - DVT/VTE  - Cardiac arrhythmia  - Respiratory Failure/COPD  - Renal failure  - Anemia  - Advanced Liver disease     DVT Prophylaxis - Aspirin Weight bearing as tolerated.  Hgb stable at 8.4 this AM  Plan is to go Home after hospital stay. Plan for discharge today following 1-2 sessions of PT as long as they are meeting their goals. Patient is scheduled for OPPT. Follow up in the office in 2 weeks.   Rosalene Billings, PA-C Orthopedic Surgery [phone] 03/27/2023, 7:14 AM

## 2023-03-27 NOTE — Progress Notes (Signed)
Physical Therapy Treatment Patient Details Name: Brendan Gonzales MRN: [id number] DOB: 10/07/1955 Today's Date: 03/27/2023   History of Present Illness 67 yo male presents to therapy s/p L TKA on 03/25/2023 due to failure of conservative measures. Pt PMH includes but is not limited to: HLD, DM II, HTN, arthritis, and angina.    PT Comments  Pt received in bed, pleasant and cooperative with PT this morning. We focused session on stair training and gait training with RW- did really well, still limited by pain and RN aware. Able to navigate steps with MinA to stabilize RW but great technique, gait mechanics with RW still antalgic and limited by pain but improved since last session. He is requesting to go home this morning, feels comfortable with mobility and steps with biggest concern being pain management by medical team. PT OK with return home after one session, doing well. Left up in recliner with all needs met, RN present and attending this morning. If plan changes and he requires one more PT visit prior to return home, please reach out to PT via epic secure messenger, happy to return.     If plan is discharge home, recommend the following: A little help with walking and/or transfers;A little help with bathing/dressing/bathroom;Assistance with cooking/housework;Assist for transportation;Help with stairs or ramp for entrance   Can travel by private vehicle        Equipment Recommendations  None recommended by PT    Recommendations for Other Services       Precautions / Restrictions Precautions Precautions: Knee;Fall Restrictions Weight Bearing Restrictions: No LLE Weight Bearing: Weight bearing as tolerated     Mobility  Bed Mobility Overal bed mobility: Needs Assistance Bed Mobility: Supine to Sit     Supine to sit: Min assist     General bed mobility comments: assist for surgical LE due to pain    Transfers Overall transfer level: Needs assistance Equipment used: Rolling  walker (2 wheels) Transfers: Sit to/from Stand Sit to Stand: Contact guard assist, Min assist           General transfer comment: initially needed MinA, progressed to Min guard as session progressed    Ambulation/Gait Ambulation/Gait assistance: Contact guard assist Gait Distance (Feet): 55 Feet Assistive device: Rolling walker (2 wheels) Gait Pattern/deviations: Step-to pattern, Decreased stride length, Decreased stance time - left, Decreased weight shift to left Gait velocity: decreased     General Gait Details: cues for good sequencing with RW, still has a lot of pain with mobility but it did improve the further we ambulated this morning (from 8/10 initially to 5/10 with gait)   Stairs             Wheelchair Mobility     Tilt Bed    Modified Rankin (Stroke Patients Only)       Balance Overall balance assessment: Needs assistance Sitting-balance support: Feet supported Sitting balance-Leahy Scale: Good     Standing balance support: Bilateral upper extremity supported, Reliant on assistive device for balance Standing balance-Leahy Scale: Poor Standing balance comment: Steady with RW                            Cognition Arousal: Alert Behavior During Therapy: WFL for tasks assessed/performed Overall Cognitive Status: Within Functional Limits for tasks assessed  Exercises      General Comments        Pertinent Vitals/Pain Pain Assessment Pain Assessment: 0-10 Pain Score: 2  Pain Location: L knee and leg at rest, up to 6-8 /10 with mobility Pain Descriptors / Indicators: Discomfort, Grimacing, Operative site guarding Pain Intervention(s): Limited activity within patient's tolerance, Monitored during session, Patient requesting pain meds-RN notified    Home Living                          Prior Function            PT Goals (current goals can now be found in the  care plan section) Acute Rehab PT Goals Patient Stated Goal: to be able to move without pain PT Goal Formulation: With patient Time For Goal Achievement: 04/08/23 Potential to Achieve Goals: Good Progress towards PT goals: Progressing toward goals    Frequency    7X/week      PT Plan      Co-evaluation              AM-PAC PT "6 Clicks" Mobility   Outcome Measure  Help needed turning from your back to your side while in a flat bed without using bedrails?: A Little Help needed moving from lying on your back to sitting on the side of a flat bed without using bedrails?: A Little Help needed moving to and from a bed to a chair (including a wheelchair)?: A Little Help needed standing up from a chair using your arms (e.g., wheelchair or bedside chair)?: A Little Help needed to walk in hospital room?: A Little Help needed climbing 3-5 steps with a railing? : A Little 6 Click Score: 18    End of Session Equipment Utilized During Treatment: Gait belt Activity Tolerance: Patient tolerated treatment well Patient left: in chair;with call bell/phone within reach;with nursing/sitter in room;with family/visitor present Nurse Communication: Mobility status;Other (comment) (pt requesting to go home after 1 PT session, moving well and OK from PT side with largest barrier being pain) PT Visit Diagnosis: Unsteadiness on feet (R26.81);Other abnormalities of gait and mobility (R26.89);Muscle weakness (generalized) (M62.81);Difficulty in walking, not elsewhere classified (R26.2);Pain Pain - Right/Left: Left Pain - part of body: Knee;Leg     Time: 1610-9604 PT Time Calculation (min) (ACUTE ONLY): 33 min  Charges:    $Gait Training: 23-37 mins PT General Charges $$ ACUTE PT VISIT: 1 Visit                     Nedra Hai, PT, DPT 03/27/23 10:03 AM

## 2023-03-31 DIAGNOSIS — M25562 Pain in left knee: Secondary | ICD-10-CM | POA: Diagnosis not present

## 2023-03-31 DIAGNOSIS — M25662 Stiffness of left knee, not elsewhere classified: Secondary | ICD-10-CM | POA: Diagnosis not present

## 2023-04-02 DIAGNOSIS — M25662 Stiffness of left knee, not elsewhere classified: Secondary | ICD-10-CM | POA: Diagnosis not present

## 2023-04-02 DIAGNOSIS — M25562 Pain in left knee: Secondary | ICD-10-CM | POA: Diagnosis not present

## 2023-04-03 NOTE — Discharge Summary (Signed)
Patient ID: Brendan Gonzales MRN: [id number] DOB/AGE: [birth year] 67 y.o.  Admit date: 03/25/2023 Discharge date: 03/27/2023  Admission Diagnoses:  Left knee osteoarthritis  Discharge Diagnoses:  Principal Problem:   S/P total knee arthroplasty, left   Past Medical History:  Diagnosis Date   Arthritis    Constipation    Diabetes mellitus    type 2   Dizziness    Headache(784.0)    Hyperlipidemia    Hypertension    benign    Surgeries: Procedure(s): TOTAL KNEE ARTHROPLASTY on 03/25/2023   Consultants:   Discharged Condition: Improved  Hospital Course: Brendan Gonzales is an 67 y.o. male who was admitted 03/25/2023 for operative treatment ofS/P total knee arthroplasty, left. Patient has severe unremitting pain that affects sleep, daily activities, and work/hobbies. After pre-op clearance the patient was taken to the operating room on 03/25/2023 and underwent  Procedure(s): TOTAL KNEE ARTHROPLASTY.    Patient was given perioperative antibiotics:  Anti-infectives (From admission, onward)    Start     Dose/Rate Route Frequency Ordered Stop   03/26/23 0000  cefadroxil (DURICEF) 500 MG capsule        500 mg Oral 2 times daily 03/26/23 0815 04/03/23 2359   03/25/23 1900  ceFAZolin (ANCEF) IVPB 2g/100 mL premix        2 g 200 mL/hr over 30 Minutes Intravenous Every 6 hours 03/25/23 1614 03/26/23 0134   03/25/23 1045  ceFAZolin (ANCEF) IVPB 2g/100 mL premix        2 g 200 mL/hr over 30 Minutes Intravenous On call to O.R. 03/25/23 1033 03/25/23 1320        Patient was given sequential compression devices, early ambulation, and chemoprophylaxis to prevent DVT. Patient worked with PT and was meeting their goals regarding safe ambulation and transfers.  Patient benefited maximally from hospital stay and there were no complications.    Recent vital signs: No data found.   Recent laboratory studies: No results for input(s): "WBC", "HGB", "HCT", "PLT", "NA", "K", "CL",  "CO2", "BUN", "CREATININE", "GLUCOSE", "INR", "CALCIUM" in the last 72 hours.  Invalid input(s): "PT", "2"   Discharge Medications:   Allergies as of 03/27/2023       Reactions   Metoprolol Diarrhea        Medication List     STOP taking these medications    cyclobenzaprine 5 MG tablet Commonly known as: FLEXERIL   ibuprofen 600 MG tablet Commonly known as: ADVIL       TAKE these medications    acetaminophen 500 MG tablet Commonly known as: TYLENOL Take 500 mg by mouth 3 (three) times daily as needed for mild pain.   amLODipine 5 MG tablet Commonly known as: NORVASC Take 1 tablet by mouth once daily   Aspirin Low Dose 81 MG chewable tablet Generic drug: aspirin Chew 1 tablet (81 mg total) by mouth 2 (two) times daily for 28 days.   atorvastatin 10 MG tablet Commonly known as: LIPITOR Take 1 tablet (10 mg total) by mouth daily.   cefadroxil 500 MG capsule Commonly known as: DURICEF Take 1 capsule (500 mg total) by mouth 2 (two) times daily for 7 days.   glimepiride 2 MG tablet Commonly known as: AMARYL Take 1 tablet (2 mg total) by mouth daily with breakfast.   lisinopril-hydrochlorothiazide 20-12.5 MG tablet Commonly known as: ZESTORETIC Take 2 tablets by mouth daily.   meloxicam 15 MG tablet Commonly known as: MOBIC Take 1 tablet (15 mg total) by  mouth daily. What changed:  medication strength how much to take   metFORMIN 500 MG tablet Commonly known as: GLUCOPHAGE Take 2 tablets (1,000 mg total) by mouth 2 (two) times daily.   methocarbamol 500 MG tablet Commonly known as: ROBAXIN Take 1 tablet (500 mg total) by mouth every 6 (six) hours as needed for muscle spasms.   oxyCODONE 5 MG immediate release tablet Commonly known as: Oxy IR/ROXICODONE Take 1-2 tablets (5-10 mg total) by mouth every 4 (four) hours as needed for severe pain. Take 2 tablets only for severe pain.   pantoprazole 40 MG tablet Commonly known as: PROTONIX Take 1 tablet  (40 mg total) by mouth daily.   polyethylene glycol powder 17 GM/SCOOP powder Commonly known as: GLYCOLAX/MIRALAX Dissolve 17 g in liquid and take by mouth 2 (two) times daily.   senna 8.6 MG Tabs tablet Commonly known as: SENOKOT Take 2 tablets (17.2 mg total) by mouth at bedtime for 14 days.               Discharge Care Instructions  (From admission, onward)           Start     Ordered   03/26/23 0000  Change dressing       Comments: Maintain surgical dressing until follow up in the clinic. If the edges start to pull up, may reinforce with tape. If the dressing is no longer working, may remove and cover with gauze and tape, but must keep the area dry and clean.  Call with any questions or concerns.   03/26/23 0815            Diagnostic Studies: No results found.  Disposition: Discharge disposition: 01-Home or Self Care       Discharge Instructions     Call MD / Call 911   Complete by: As directed    If you experience chest pain or shortness of breath, CALL 911 and be transported to the hospital emergency room.  If you develope a fever above 101 F, pus (white drainage) or increased drainage or redness at the wound, or calf pain, call your surgeon's office.   Change dressing   Complete by: As directed    Maintain surgical dressing until follow up in the clinic. If the edges start to pull up, may reinforce with tape. If the dressing is no longer working, may remove and cover with gauze and tape, but must keep the area dry and clean.  Call with any questions or concerns.   Constipation Prevention   Complete by: As directed    Drink plenty of fluids.  Prune juice may be helpful.  You may use a stool softener, such as Colace (over the counter) 100 mg twice a day.  Use MiraLax (over the counter) for constipation as needed.   Diet - low sodium heart healthy   Complete by: As directed    Increase activity slowly as tolerated   Complete by: As directed    Weight  bearing as tolerated with assist device (walker, cane, etc) as directed, use it as long as suggested by your surgeon or therapist, typically at least 4-6 weeks.   Post-operative opioid taper instructions:   Complete by: As directed    POST-OPERATIVE OPIOID TAPER INSTRUCTIONS: It is important to wean off of your opioid medication as soon as possible. If you do not need pain medication after your surgery it is ok to stop day one. Opioids include: Codeine, Hydrocodone(Norco, Vicodin), Oxycodone(Percocet, oxycontin) and hydromorphone  amongst others.  Long term and even short term use of opiods can cause: Increased pain response Dependence Constipation Depression Respiratory depression And more.  Withdrawal symptoms can include Flu like symptoms Nausea, vomiting And more Techniques to manage these symptoms Hydrate well Eat regular healthy meals Stay active Use relaxation techniques(deep breathing, meditating, yoga) Do Not substitute Alcohol to help with tapering If you have been on opioids for less than two weeks and do not have pain than it is ok to stop all together.  Plan to wean off of opioids This plan should start within one week post op of your joint replacement. Maintain the same interval or time between taking each dose and first decrease the dose.  Cut the total daily intake of opioids by one tablet each day Next start to increase the time between doses. The last dose that should be eliminated is the evening dose.      TED hose   Complete by: As directed    Use stockings (TED hose) for 2 weeks on both leg(s).  You may remove them at night for sleeping.        Follow-up Information     Durene Romans, MD. Schedule an appointment as soon as possible for a visit in 2 week(s).   Specialty: Orthopedic Surgery Contact information: [address] [place] [address] [place] [address] [phone]                  Signed: Cassandria Anger 04/03/2023, 1:54  PM

## 2023-04-04 DIAGNOSIS — M25562 Pain in left knee: Secondary | ICD-10-CM | POA: Diagnosis not present

## 2023-04-04 DIAGNOSIS — M25662 Stiffness of left knee, not elsewhere classified: Secondary | ICD-10-CM | POA: Diagnosis not present

## 2023-04-07 DIAGNOSIS — M25562 Pain in left knee: Secondary | ICD-10-CM | POA: Diagnosis not present

## 2023-04-07 DIAGNOSIS — M25662 Stiffness of left knee, not elsewhere classified: Secondary | ICD-10-CM | POA: Diagnosis not present

## 2023-04-08 DIAGNOSIS — Z8249 Family history of ischemic heart disease and other diseases of the circulatory system: Secondary | ICD-10-CM | POA: Diagnosis not present

## 2023-04-08 DIAGNOSIS — E119 Type 2 diabetes mellitus without complications: Secondary | ICD-10-CM | POA: Diagnosis not present

## 2023-04-08 DIAGNOSIS — K219 Gastro-esophageal reflux disease without esophagitis: Secondary | ICD-10-CM | POA: Diagnosis not present

## 2023-04-08 DIAGNOSIS — E785 Hyperlipidemia, unspecified: Secondary | ICD-10-CM | POA: Diagnosis not present

## 2023-04-08 DIAGNOSIS — L089 Local infection of the skin and subcutaneous tissue, unspecified: Secondary | ICD-10-CM | POA: Diagnosis not present

## 2023-04-08 DIAGNOSIS — I10 Essential (primary) hypertension: Secondary | ICD-10-CM | POA: Diagnosis not present

## 2023-04-08 DIAGNOSIS — R600 Localized edema: Secondary | ICD-10-CM | POA: Diagnosis not present

## 2023-04-08 DIAGNOSIS — Z791 Long term (current) use of non-steroidal anti-inflammatories (NSAID): Secondary | ICD-10-CM | POA: Diagnosis not present

## 2023-04-08 DIAGNOSIS — G8918 Other acute postprocedural pain: Secondary | ICD-10-CM | POA: Diagnosis not present

## 2023-04-08 DIAGNOSIS — M1990 Unspecified osteoarthritis, unspecified site: Secondary | ICD-10-CM | POA: Diagnosis not present

## 2023-04-08 DIAGNOSIS — Z7984 Long term (current) use of oral hypoglycemic drugs: Secondary | ICD-10-CM | POA: Diagnosis not present

## 2023-04-08 DIAGNOSIS — Z888 Allergy status to other drugs, medicaments and biological substances status: Secondary | ICD-10-CM | POA: Diagnosis not present

## 2023-04-09 DIAGNOSIS — M25562 Pain in left knee: Secondary | ICD-10-CM | POA: Diagnosis not present

## 2023-04-09 DIAGNOSIS — M25662 Stiffness of left knee, not elsewhere classified: Secondary | ICD-10-CM | POA: Diagnosis not present

## 2023-04-11 DIAGNOSIS — M25662 Stiffness of left knee, not elsewhere classified: Secondary | ICD-10-CM | POA: Diagnosis not present

## 2023-04-11 DIAGNOSIS — M25562 Pain in left knee: Secondary | ICD-10-CM | POA: Diagnosis not present

## 2023-04-14 ENCOUNTER — Other Ambulatory Visit (HOSPITAL_COMMUNITY): Payer: Self-pay

## 2023-04-15 ENCOUNTER — Other Ambulatory Visit: Payer: Self-pay | Admitting: Family Medicine

## 2023-04-15 DIAGNOSIS — M25562 Pain in left knee: Secondary | ICD-10-CM | POA: Diagnosis not present

## 2023-04-15 DIAGNOSIS — M25662 Stiffness of left knee, not elsewhere classified: Secondary | ICD-10-CM | POA: Diagnosis not present

## 2023-04-15 DIAGNOSIS — I10 Essential (primary) hypertension: Secondary | ICD-10-CM

## 2023-04-16 ENCOUNTER — Other Ambulatory Visit (HOSPITAL_COMMUNITY): Payer: Self-pay

## 2023-04-17 DIAGNOSIS — M25662 Stiffness of left knee, not elsewhere classified: Secondary | ICD-10-CM | POA: Diagnosis not present

## 2023-04-17 DIAGNOSIS — M25562 Pain in left knee: Secondary | ICD-10-CM | POA: Diagnosis not present

## 2023-04-21 DIAGNOSIS — M25562 Pain in left knee: Secondary | ICD-10-CM | POA: Diagnosis not present

## 2023-04-21 DIAGNOSIS — M25662 Stiffness of left knee, not elsewhere classified: Secondary | ICD-10-CM | POA: Diagnosis not present

## 2023-04-23 DIAGNOSIS — M25562 Pain in left knee: Secondary | ICD-10-CM | POA: Diagnosis not present

## 2023-04-23 DIAGNOSIS — M25662 Stiffness of left knee, not elsewhere classified: Secondary | ICD-10-CM | POA: Diagnosis not present

## 2023-04-27 ENCOUNTER — Other Ambulatory Visit: Payer: Self-pay | Admitting: Family Medicine

## 2023-04-27 DIAGNOSIS — K219 Gastro-esophageal reflux disease without esophagitis: Secondary | ICD-10-CM

## 2023-04-28 DIAGNOSIS — M25562 Pain in left knee: Secondary | ICD-10-CM | POA: Diagnosis not present

## 2023-04-28 DIAGNOSIS — M25662 Stiffness of left knee, not elsewhere classified: Secondary | ICD-10-CM | POA: Diagnosis not present

## 2023-04-30 DIAGNOSIS — M25562 Pain in left knee: Secondary | ICD-10-CM | POA: Diagnosis not present

## 2023-04-30 DIAGNOSIS — M25662 Stiffness of left knee, not elsewhere classified: Secondary | ICD-10-CM | POA: Diagnosis not present

## 2023-05-05 DIAGNOSIS — M25562 Pain in left knee: Secondary | ICD-10-CM | POA: Diagnosis not present

## 2023-05-05 DIAGNOSIS — M25662 Stiffness of left knee, not elsewhere classified: Secondary | ICD-10-CM | POA: Diagnosis not present

## 2023-05-07 DIAGNOSIS — M25562 Pain in left knee: Secondary | ICD-10-CM | POA: Diagnosis not present

## 2023-05-07 DIAGNOSIS — M25662 Stiffness of left knee, not elsewhere classified: Secondary | ICD-10-CM | POA: Diagnosis not present

## 2023-05-12 DIAGNOSIS — M25562 Pain in left knee: Secondary | ICD-10-CM | POA: Diagnosis not present

## 2023-05-12 DIAGNOSIS — M25662 Stiffness of left knee, not elsewhere classified: Secondary | ICD-10-CM | POA: Diagnosis not present

## 2023-05-14 DIAGNOSIS — Z4789 Encounter for other orthopedic aftercare: Secondary | ICD-10-CM | POA: Diagnosis not present

## 2023-05-14 DIAGNOSIS — M25562 Pain in left knee: Secondary | ICD-10-CM | POA: Diagnosis not present

## 2023-05-14 DIAGNOSIS — M25662 Stiffness of left knee, not elsewhere classified: Secondary | ICD-10-CM | POA: Diagnosis not present

## 2023-05-20 DIAGNOSIS — M25562 Pain in left knee: Secondary | ICD-10-CM | POA: Diagnosis not present

## 2023-05-20 DIAGNOSIS — M25662 Stiffness of left knee, not elsewhere classified: Secondary | ICD-10-CM | POA: Diagnosis not present

## 2023-05-27 ENCOUNTER — Telehealth: Payer: Self-pay | Admitting: Family Medicine

## 2023-05-27 DIAGNOSIS — M25562 Pain in left knee: Secondary | ICD-10-CM | POA: Diagnosis not present

## 2023-05-27 DIAGNOSIS — M25662 Stiffness of left knee, not elsewhere classified: Secondary | ICD-10-CM | POA: Diagnosis not present

## 2023-05-27 NOTE — Telephone Encounter (Signed)
Type of form received:[employer] -Comp Chart Notes   Additional comments:   Received [id number] - Front Desk  Form should be Faxed/mailed to: (address/ fax #) N/A  Is patient requesting call for pickup:N/A  Form placed:  [employer] charge sheet.  Provider will determine charge. N/A  Individual made aware of 3-5 business day turn around No?

## 2023-05-27 NOTE — Telephone Encounter (Signed)
Placed in folder at [employer].

## 2023-05-28 NOTE — Telephone Encounter (Signed)
Noted, reviewed, will place in scan bin.

## 2023-06-03 DIAGNOSIS — M25562 Pain in left knee: Secondary | ICD-10-CM | POA: Diagnosis not present

## 2023-06-03 DIAGNOSIS — M25662 Stiffness of left knee, not elsewhere classified: Secondary | ICD-10-CM | POA: Diagnosis not present

## 2023-06-10 DIAGNOSIS — M25562 Pain in left knee: Secondary | ICD-10-CM | POA: Diagnosis not present

## 2023-06-10 DIAGNOSIS — M25662 Stiffness of left knee, not elsewhere classified: Secondary | ICD-10-CM | POA: Diagnosis not present

## 2023-06-13 DIAGNOSIS — Z471 Aftercare following joint replacement surgery: Secondary | ICD-10-CM | POA: Diagnosis not present

## 2023-06-13 DIAGNOSIS — M1711 Unilateral primary osteoarthritis, right knee: Secondary | ICD-10-CM | POA: Diagnosis not present

## 2023-06-16 DIAGNOSIS — M25562 Pain in left knee: Secondary | ICD-10-CM | POA: Diagnosis not present

## 2023-06-16 DIAGNOSIS — M25662 Stiffness of left knee, not elsewhere classified: Secondary | ICD-10-CM | POA: Diagnosis not present

## 2023-07-01 ENCOUNTER — Other Ambulatory Visit: Payer: Self-pay | Admitting: Family Medicine

## 2023-07-01 DIAGNOSIS — I10 Essential (primary) hypertension: Secondary | ICD-10-CM

## 2023-07-01 DIAGNOSIS — E11649 Type 2 diabetes mellitus with hypoglycemia without coma: Secondary | ICD-10-CM

## 2023-07-01 DIAGNOSIS — K219 Gastro-esophageal reflux disease without esophagitis: Secondary | ICD-10-CM

## 2023-07-01 DIAGNOSIS — E785 Hyperlipidemia, unspecified: Secondary | ICD-10-CM

## 2023-07-01 DIAGNOSIS — E1165 Type 2 diabetes mellitus with hyperglycemia: Secondary | ICD-10-CM

## 2023-07-08 DIAGNOSIS — H35033 Hypertensive retinopathy, bilateral: Secondary | ICD-10-CM | POA: Diagnosis not present

## 2023-07-08 DIAGNOSIS — H52222 Regular astigmatism, left eye: Secondary | ICD-10-CM | POA: Diagnosis not present

## 2023-07-08 DIAGNOSIS — Z0100 Encounter for examination of eyes and vision without abnormal findings: Secondary | ICD-10-CM | POA: Diagnosis not present

## 2023-07-08 LAB — HM DIABETES EYE EXAM

## 2023-07-10 ENCOUNTER — Encounter: Payer: Self-pay | Admitting: Family Medicine

## 2023-07-10 ENCOUNTER — Ambulatory Visit: Payer: Medicare HMO | Admitting: Family Medicine

## 2023-07-10 VITALS — BP 128/60 | HR 67 | Temp 97.9°F | Ht 72.0 in | Wt 203.4 lb

## 2023-07-10 DIAGNOSIS — I10 Essential (primary) hypertension: Secondary | ICD-10-CM | POA: Diagnosis not present

## 2023-07-10 DIAGNOSIS — E785 Hyperlipidemia, unspecified: Secondary | ICD-10-CM

## 2023-07-10 DIAGNOSIS — E1165 Type 2 diabetes mellitus with hyperglycemia: Secondary | ICD-10-CM

## 2023-07-10 DIAGNOSIS — Z7984 Long term (current) use of oral hypoglycemic drugs: Secondary | ICD-10-CM

## 2023-07-10 LAB — COMPREHENSIVE METABOLIC PANEL
ALT: 13 U/L (ref 0–53)
AST: 16 U/L (ref 0–37)
Albumin: 4.6 g/dL (ref 3.5–5.2)
Alkaline Phosphatase: 88 U/L (ref 39–117)
BUN: 10 mg/dL (ref 6–23)
CO2: 31 meq/L (ref 19–32)
Calcium: 9.2 mg/dL (ref 8.4–10.5)
Chloride: 96 meq/L (ref 96–112)
Creatinine, Ser: 0.61 mg/dL (ref 0.40–1.50)
GFR: 100.24 mL/min (ref >60.00)
Glucose, Bld: 98 mg/dL (ref 70–99)
Potassium: 3.8 meq/L (ref 3.5–5.1)
Sodium: 135 meq/L (ref 135–145)
Total Bilirubin: 0.3 mg/dL (ref 0.2–1.2)
Total Protein: 7.6 g/dL (ref 6.0–8.3)

## 2023-07-10 LAB — LIPID PANEL
Cholesterol: 151 mg/dL (ref 0–200)
HDL: 50.9 mg/dL (ref >39.00)
LDL Cholesterol: 73 mg/dL (ref 0–99)
NonHDL: 99.76
Total CHOL/HDL Ratio: 3
Triglycerides: 136 mg/dL (ref 0.0–149.0)
VLDL: 27.2 mg/dL (ref 0.0–40.0)

## 2023-07-10 LAB — HEMOGLOBIN A1C: Hgb A1c MFr Bld: 7.3 % — ABNORMAL HIGH (ref 4.6–6.5)

## 2023-07-10 NOTE — Progress Notes (Signed)
Subjective:  Patient ID: Brendan Gonzales, male    DOB: 09-May-1956  Age: 68 y.o. MRN: [id number]  CC:  Chief Complaint  Patient presents with   Medical Management of Chronic Issues    No concerns     HPI Brendan Gonzales presents for   Diabetes: With hyperglycemia.  Metformin 1000 mg twice daily, glimepiride 2 mg daily as other meds have been cost prohibitive.  He is on ACE inhibitor and Lipitor for statin. Home readings fasting -133 Postprandial - up to 140-175 Symptomatic lows.none.  Microalbumin: normal 09/2022.  Optho, foot exam, pneumovax: optho - last week. Told was ok - report sent.  Knee pain has been improving after surgery - back at work on light duty.  Had injection to other knee.   Lab Results  Component Value Date   HGBA1C 7.9 (H) 03/12/2023   HGBA1C 7.4 (H) 01/06/2023   HGBA1C 7.4 (H) 09/23/2022   Lab Results  Component Value Date   MICROALBUR <0.7 09/23/2022   LDLCALC 75 09/23/2022   CREATININE 0.76 03/26/2023     Hypertension: No new side effects with his amlodipine or lisinopril HCT. Both daily.  Home readings: 137/79.  BP Readings from Last 3 Encounters:  07/10/23 128/60  03/27/23 (!) 158/103  03/12/23 (!) 153/82   Lab Results  Component Value Date   CREATININE 0.76 03/26/2023    Hyperlipidemia: Lipitor 10mg  every day. No myalgia/side effects.  Lab Results  Component Value Date   CHOL 145 09/23/2022   HDL 48.90 09/23/2022   LDLCALC 75 09/23/2022   TRIG 107.0 09/23/2022   CHOLHDL 3 09/23/2022   Lab Results  Component Value Date   ALT 14 09/23/2022   AST 17 09/23/2022   ALKPHOS 82 09/23/2022   BILITOT 0.3 09/23/2022       History Patient Active Problem List   Diagnosis Date Noted   S/P total knee arthroplasty, left 03/25/2023   Stiffness of left knee 03/12/2023   Osteoarthritis of left knee 12/29/2022   Derangement of left knee 12/29/2022   Hyperlipidemia 09/05/2022   Hospice care patient 07/06/2019   Eczema  of hand 10/19/2014   Type 2 diabetes mellitus (HCC) 06/13/2013   Essential hypertension 06/13/2013   Normal coronary arteries- 2008 06/13/2013   Chest pain with moderate risk of acute coronary syndrome 06/12/2013   Chest pain 06/12/2013   Special screening for malignant neoplasms, colon 06/27/2011   Arthritis of knee 06/14/2011   Past Medical History:  Diagnosis Date   Arthritis    Constipation    Diabetes mellitus    type 2   Dizziness    Headache(784.0)    Hyperlipidemia    Hypertension    benign   Past Surgical History:  Procedure Laterality Date   COLONOSCOPY     CORONARY ANGIOPLASTY     HEMORRHOID SURGERY     right femeroral bypass     right knee surgery     TOTAL KNEE ARTHROPLASTY Left 03/25/2023   Procedure: TOTAL KNEE ARTHROPLASTY;  Surgeon: Durene Romans, MD;  Location: [facility];  Service: Orthopedics;  Laterality: Left;   Allergies  Allergen Reactions   Metoprolol Diarrhea   Prior to Admission medications   Medication Sig Start Date End Date Taking? Authorizing Provider  acetaminophen (TYLENOL) 500 MG tablet Take 500 mg by mouth 3 (three) times daily as needed for mild pain.    Yes [provider]  amLODipine (NORVASC) 5 MG tablet Take 1 tablet by mouth once  daily 07/01/23  Yes [name], MD  atorvastatin (LIPITOR) 10 MG tablet Take 1 tablet by mouth once daily 07/01/23  Yes [name], MD  glimepiride (AMARYL) 2 MG tablet Take 1 tablet by mouth once daily with breakfast 07/01/23  Yes [name], MD  lisinopril-hydrochlorothiazide (ZESTORETIC) 20-12.5 MG tablet Take 2 tablets by mouth once daily 07/01/23  Yes [name], MD  metFORMIN (GLUCOPHAGE) 500 MG tablet Take 2 tablets by mouth twice daily 07/01/23  Yes [name], MD  methocarbamol (ROBAXIN) 500 MG tablet Take 1 tablet (500 mg total) by mouth every 6 (six) hours as needed for muscle spasms. 03/26/23  Yes Cassandria Anger, PA-C  oxyCODONE (OXY IR/ROXICODONE) 5 MG immediate  release tablet Take 1-2 tablets (5-10 mg total) by mouth every 4 (four) hours as needed for severe pain. Take 2 tablets only for severe pain. 03/26/23  Yes Cassandria Anger, PA-C  pantoprazole (PROTONIX) 40 MG tablet Take 1 tablet by mouth once daily 07/01/23  Yes [name], MD  polyethylene glycol powder (GLYCOLAX/MIRALAX) 17 GM/SCOOP powder Dissolve 17 g in liquid and take by mouth 2 (two) times daily. 03/26/23  Yes Cassandria Anger, PA-C   Social History   Socioeconomic History   Marital status: Married    Spouse name: Not on file   Number of children: Not on file   Years of education: Not on file   Highest education level: Not on file  Occupational History   Not on file  Tobacco Use   Smoking status: Former   Smokeless tobacco: Never  Substance and Sexual Activity   Alcohol use: No   Drug use: No   Sexual activity: Never  Other Topics Concern   Not on file  Social History Narrative   Not on file   Social Drivers of Health   Financial Resource Strain: Low Risk  (03/19/2023)   Overall Financial Resource Strain (CARDIA)    Difficulty of Paying Living Expenses: Not hard at all  Food Insecurity: No Food Insecurity (03/25/2023)   Hunger Vital Sign    Worried About Running Out of Food in the Last Year: Never true    Ran Out of Food in the Last Year: Never true  Transportation Needs: No Transportation Needs (03/25/2023)   PRAPARE - Administrator, Civil Service (Medical): No    Lack of Transportation (Non-Medical): No  Physical Activity: Insufficiently Active (03/19/2023)   Exercise Vital Sign    Days of Exercise per Week: 3 days    Minutes of Exercise per Session: 20 min  Stress: Stress Concern Present (03/19/2023)   [employer] of Occupational Health - Occupational Stress Questionnaire    Feeling of Stress : To some extent  Social Connections: Moderately Integrated (03/19/2023)   Social Connection and Isolation Panel [NHANES]    Frequency of  Communication with Friends and Family: More than three times a week    Frequency of Social Gatherings with Friends and Family: Twice a week    Attends Religious Services: Never    Database administrator or Organizations: No    Attends Engineer, structural: More than 4 times per year    Marital Status: Married  Catering manager Violence: Not At Risk (03/25/2023)   Humiliation, Afraid, Rape, and Kick questionnaire    Fear of Current or Ex-Partner: No    Emotionally Abused: No    Physically Abused: No    Sexually Abused: No  Review of Systems  Constitutional:  Negative for fatigue and unexpected weight change.  Eyes:  Negative for visual disturbance.  Respiratory:  Negative for cough, chest tightness and shortness of breath.   Cardiovascular:  Negative for chest pain, palpitations and leg swelling.  Gastrointestinal:  Negative for abdominal pain and blood in stool.  Neurological:  Negative for dizziness, light-headedness and headaches.     Objective:   Vitals:   07/10/23 0906  BP: 128/60  Pulse: 67  Temp: 97.9 F (36.6 C)  TempSrc: Temporal  SpO2: 96%  Weight: 203 lb 6.4 oz (92.3 kg)  Height: 6' (1.829 m)     Physical Exam Vitals reviewed.  Constitutional:      Appearance: He is well-developed.  HENT:     Head: Normocephalic and atraumatic.  Neck:     Vascular: No carotid bruit or JVD.  Cardiovascular:     Rate and Rhythm: Normal rate and regular rhythm.     Heart sounds: Normal heart sounds. No murmur heard. Pulmonary:     Effort: Pulmonary effort is normal.     Breath sounds: Normal breath sounds. No rales.  Musculoskeletal:     Right lower leg: No edema.     Left lower leg: No edema.  Skin:    General: Skin is warm and dry.  Neurological:     Mental Status: He is alert and oriented to person, place, and time.  Psychiatric:        Mood and Affect: Mood normal.        Assessment & Plan:  Brendan Gonzales is a 68 y.o. male . Type 2  diabetes mellitus with hyperglycemia, without long-term current use of insulin (HCC) - Plan: Hemoglobin A1c  -Tolerating current med regimen, check A1c and adjust plan accordingly.  Could consider alternate options given new year and possible different coverage options or meeting with pharmacist to see if medication assistance programs available.  No changes for now.  Essential hypertension - Plan: Comprehensive metabolic panel  -Stable with current regimen, check labs and adjust plan accordingly.  Hyperlipidemia, unspecified hyperlipidemia type - Plan: Comprehensive metabolic panel, Lipid panel  -Tolerating current meds, check labs and adjust plan accordingly.  No orders of the defined types were placed in this encounter.  Patient Instructions  Thank you for coming in today.  No medication changes at this time, but depending on labs we may need to adjust your diabetes medications.  It may be worth looking into other medication options to see if those would be less costly at this time or if there may be some medication assistance plans that would help.  I can refer you to the pharmacist to assist with this if needed but lets look at your labs first.  Take care!    Signed,   Meredith Staggers, MD [facility], [facility] 07/10/23 10:16 AM

## 2023-07-10 NOTE — Patient Instructions (Signed)
Thank you for coming in today.  No medication changes at this time, but depending on labs we may need to adjust your diabetes medications.  It may be worth looking into other medication options to see if those would be less costly at this time or if there may be some medication assistance plans that would help.  I can refer you to the pharmacist to assist with this if needed but lets look at your labs first.  Take care!

## 2023-07-15 ENCOUNTER — Encounter: Payer: Self-pay | Admitting: Family Medicine

## 2023-07-16 ENCOUNTER — Encounter: Payer: Self-pay | Admitting: Family Medicine

## 2023-08-03 ENCOUNTER — Encounter: Payer: Self-pay | Admitting: Family Medicine

## 2023-08-04 NOTE — Telephone Encounter (Signed)
 Patient reports several low BG readings with symptoms notes he is going to stop glimipride and take metformin  BID unless PCP advises otherwise

## 2023-08-21 MED ORDER — GLIMEPIRIDE 1 MG PO TABS: 1.0000 mg | ORAL_TABLET | Freq: Every day | ORAL | 1 refills | Status: DC

## 2023-08-21 NOTE — Addendum Note (Signed)
 Addended by: [name] on: 08/21/2023 09:46 AM   Modules accepted: Orders

## 2023-09-07 DIAGNOSIS — Z20822 Contact with and (suspected) exposure to covid-19: Secondary | ICD-10-CM | POA: Diagnosis not present

## 2023-09-07 DIAGNOSIS — J069 Acute upper respiratory infection, unspecified: Secondary | ICD-10-CM | POA: Diagnosis not present

## 2023-09-26 ENCOUNTER — Other Ambulatory Visit: Payer: Self-pay | Admitting: Family Medicine

## 2023-09-26 DIAGNOSIS — E11649 Type 2 diabetes mellitus with hypoglycemia without coma: Secondary | ICD-10-CM

## 2023-09-26 DIAGNOSIS — I10 Essential (primary) hypertension: Secondary | ICD-10-CM

## 2023-09-26 DIAGNOSIS — E785 Hyperlipidemia, unspecified: Secondary | ICD-10-CM

## 2023-10-09 ENCOUNTER — Other Ambulatory Visit (INDEPENDENT_AMBULATORY_CARE_PROVIDER_SITE_OTHER)

## 2023-10-09 ENCOUNTER — Ambulatory Visit (INDEPENDENT_AMBULATORY_CARE_PROVIDER_SITE_OTHER): Payer: Medicare HMO | Admitting: Family Medicine

## 2023-10-09 ENCOUNTER — Encounter: Payer: Self-pay | Admitting: Family Medicine

## 2023-10-09 VITALS — BP 122/60 | HR 55 | Temp 97.9°F | Ht 72.0 in | Wt 199.8 lb

## 2023-10-09 DIAGNOSIS — E11649 Type 2 diabetes mellitus with hypoglycemia without coma: Secondary | ICD-10-CM

## 2023-10-09 DIAGNOSIS — K219 Gastro-esophageal reflux disease without esophagitis: Secondary | ICD-10-CM | POA: Diagnosis not present

## 2023-10-09 DIAGNOSIS — I10 Essential (primary) hypertension: Secondary | ICD-10-CM | POA: Diagnosis not present

## 2023-10-09 DIAGNOSIS — Z7984 Long term (current) use of oral hypoglycemic drugs: Secondary | ICD-10-CM

## 2023-10-09 DIAGNOSIS — J309 Allergic rhinitis, unspecified: Secondary | ICD-10-CM

## 2023-10-09 LAB — COMPREHENSIVE METABOLIC PANEL WITH GFR
ALT: 16 U/L (ref 0–53)
AST: 18 U/L (ref 0–37)
Albumin: 4.5 g/dL (ref 3.5–5.2)
Alkaline Phosphatase: 96 U/L (ref 39–117)
BUN: 8 mg/dL (ref 6–23)
CO2: 32 meq/L (ref 19–32)
Calcium: 9 mg/dL (ref 8.4–10.5)
Chloride: 97 meq/L (ref 96–112)
Creatinine, Ser: 0.62 mg/dL (ref 0.40–1.50)
GFR: 99.57 mL/min (ref >60.00)
Glucose, Bld: 128 mg/dL — ABNORMAL HIGH (ref 70–99)
Potassium: 3.9 meq/L (ref 3.5–5.1)
Sodium: 136 meq/L (ref 135–145)
Total Bilirubin: 0.4 mg/dL (ref 0.2–1.2)
Total Protein: 7.6 g/dL (ref 6.0–8.3)

## 2023-10-09 LAB — HEMOGLOBIN A1C: Hgb A1c MFr Bld: 7.3 % — ABNORMAL HIGH (ref 4.6–6.5)

## 2023-10-09 MED ORDER — PANTOPRAZOLE SODIUM 40 MG PO TBEC: 40.0000 mg | DELAYED_RELEASE_TABLET | Freq: Every day | ORAL | 1 refills | Status: DC

## 2023-10-09 MED ORDER — AMLODIPINE BESYLATE 5 MG PO TABS: 5.0000 mg | ORAL_TABLET | Freq: Every day | ORAL | 1 refills | Status: DC

## 2023-10-09 MED ORDER — METFORMIN HCL 500 MG PO TABS: 1000.0000 mg | ORAL_TABLET | Freq: Two times a day (BID) | ORAL | 1 refills | Status: DC

## 2023-10-09 MED ORDER — LISINOPRIL-HYDROCHLOROTHIAZIDE 20-12.5 MG PO TABS: 2.0000 | ORAL_TABLET | Freq: Every day | ORAL | 1 refills | Status: DC

## 2023-10-09 NOTE — Patient Instructions (Signed)
 Thanks for coming in today.  No medication changes at this time.  I will check your labs and let you know if any changes needed.    For allergies okay to take over-the-counter antihistamine such as Claritin, Allegra or Zyrtec.  If those are ineffective, you can use Flonase or other similar allergy nasal spray.  Let me know if any continued symptoms or worsening symptoms.  I do recommend the shingles vaccine through your pharmacy, as well as a COVID booster if you have not had one of those recently.  Follow-up in 3 months and we will check other labs at that time.  Take care!

## 2023-10-09 NOTE — Progress Notes (Signed)
 Subjective:  Patient ID: Brendan Gonzales, male    DOB: 1956/05/06  Age: 68 y.o. MRN: [id number]  CC:  Chief Complaint  Patient presents with   Medical Management of Chronic Issues    Pt is well, notes no questions     HPI Brendan Gonzales presents for   Diabetes: With hyperglycemia.  Treated with metformin 1000 mg twice daily, glimepiride 2 mg daily.  Other meds have been cost prohibitive.  He is on ACE inhibitor and statin.  A1c was improving from September 2024 through January of this year. Home readings fasting: 130 today, typical 120-130 Postprandial: 160-165. No 200's recently.  No symptomatic lows.  Microalbumin: Normal in April 2024. Optho, foot exam, pneumovax:  Up-to-date. Shingles vaccine - recommended at pharmacy.  COVID-vaccine: no recent booster, recommended.  Some allergies recently with pollen. OTC allergy pill daily. Working ok.   Lab Results  Component Value Date   HGBA1C 7.3 (H) 07/10/2023   HGBA1C 7.9 (H) 03/12/2023   HGBA1C 7.4 (H) 01/06/2023   Lab Results  Component Value Date   MICROALBUR <0.7 09/23/2022   LDLCALC 73 07/10/2023   CREATININE 0.61 07/10/2023    History Patient Active Problem List   Diagnosis Date Noted   S/P total knee arthroplasty, left 03/25/2023   Stiffness of left knee 03/12/2023   Osteoarthritis of left knee 12/29/2022   Derangement of left knee 12/29/2022   Hyperlipidemia 09/05/2022   Hospice care patient 07/06/2019   Eczema of hand 10/19/2014   Type 2 diabetes mellitus (HCC) 06/13/2013   Essential hypertension 06/13/2013   Normal coronary arteries- 2008 06/13/2013   Chest pain with moderate risk of acute coronary syndrome 06/12/2013   Chest pain 06/12/2013   Special screening for malignant neoplasms, colon 06/27/2011   Arthritis of knee 06/14/2011   Past Medical History:  Diagnosis Date   Arthritis    Constipation    Diabetes mellitus    type 2   Dizziness    Headache(784.0)    Hyperlipidemia     Hypertension    benign   Past Surgical History:  Procedure Laterality Date   COLONOSCOPY     CORONARY ANGIOPLASTY     HEMORRHOID SURGERY     right femeroral bypass     right knee surgery     TOTAL KNEE ARTHROPLASTY Left 03/25/2023   Procedure: TOTAL KNEE ARTHROPLASTY;  Surgeon: Durene Romans, MD;  Location: [facility];  Service: Orthopedics;  Laterality: Left;   Allergies  Allergen Reactions   Metoprolol Diarrhea   Prior to Admission medications   Medication Sig Start Date End Date Taking? Authorizing Provider  acetaminophen (TYLENOL) 500 MG tablet Take 500 mg by mouth 3 (three) times daily as needed for mild pain.    Yes [provider]  amLODipine (NORVASC) 5 MG tablet Take 1 tablet by mouth once daily 07/01/23  Yes [name], MD  atorvastatin (LIPITOR) 10 MG tablet Take 1 tablet by mouth once daily 09/26/23  Yes [name], MD  glimepiride (AMARYL) 1 MG tablet Take 1 tablet (1 mg total) by mouth daily with breakfast. 08/21/23  Yes [name], MD  lisinopril-hydrochlorothiazide (ZESTORETIC) 20-12.5 MG tablet Take 2 tablets by mouth once daily 09/26/23  Yes [name], MD  metFORMIN (GLUCOPHAGE) 500 MG tablet Take 2 tablets by mouth twice daily 09/26/23  Yes [name], MD  methocarbamol (ROBAXIN) 500 MG tablet Take 1 tablet (500 mg total) by mouth every 6 (six)  hours as needed for muscle spasms. 03/26/23  Yes Earnie Gola, PA-C  pantoprazole (PROTONIX) 40 MG tablet Take 1 tablet by mouth once daily 07/01/23  Yes Benjiman Bras, MD  polyethylene glycol powder (GLYCOLAX/MIRALAX) 17 GM/SCOOP powder Dissolve 17 g in liquid and take by mouth 2 (two) times daily. 03/26/23  Yes Earnie Gola, PA-C  oxyCODONE (OXY IR/ROXICODONE) 5 MG immediate release tablet Take 1-2 tablets (5-10 mg total) by mouth every 4 (four) hours as needed for severe pain. Take 2 tablets only for severe pain. Patient not taking: Reported on 10/09/2023 03/26/23   Earnie Gola,  PA-C   Social History   Socioeconomic History   Marital status: Married    Spouse name: Not on file   Number of children: Not on file   Years of education: Not on file   Highest education level: Not on file  Occupational History   Not on file  Tobacco Use   Smoking status: Former   Smokeless tobacco: Never  Substance and Sexual Activity   Alcohol use: No   Drug use: No   Sexual activity: Never  Other Topics Concern   Not on file  Social History Narrative   Not on file   Social Drivers of Health   Financial Resource Strain: Low Risk  (03/19/2023)   Overall Financial Resource Strain (CARDIA)    Difficulty of Paying Living Expenses: Not hard at all  Food Insecurity: No Food Insecurity (03/25/2023)   Hunger Vital Sign    Worried About Running Out of Food in the Last Year: Never true    Ran Out of Food in the Last Year: Never true  Transportation Needs: No Transportation Needs (03/25/2023)   PRAPARE - Administrator, Civil Service (Medical): No    Lack of Transportation (Non-Medical): No  Physical Activity: Insufficiently Active (03/19/2023)   Exercise Vital Sign    Days of Exercise per Week: 3 days    Minutes of Exercise per Session: 20 min  Stress: Stress Concern Present (03/19/2023)   [employer] of Occupational Health - Occupational Stress Questionnaire    Feeling of Stress : To some extent  Social Connections: Moderately Integrated (03/19/2023)   Social Connection and Isolation Panel [NHANES]    Frequency of Communication with Friends and Family: More than three times a week    Frequency of Social Gatherings with Friends and Family: Twice a week    Attends Religious Services: Never    Database administrator or Organizations: No    Attends Engineer, structural: More than 4 times per year    Marital Status: Married  Catering manager Violence: Not At Risk (03/25/2023)   Humiliation, Afraid, Rape, and Kick questionnaire    Fear of Current or  Ex-Partner: No    Emotionally Abused: No    Physically Abused: No    Sexually Abused: No    Review of Systems  Constitutional:  Negative for fatigue and unexpected weight change.  Eyes:  Negative for visual disturbance.  Respiratory:  Negative for cough, chest tightness and shortness of breath.   Cardiovascular:  Negative for chest pain, palpitations and leg swelling.  Gastrointestinal:  Negative for abdominal pain and blood in stool.  Neurological:  Positive for headaches (Chronic, no changes no new headaches.). Negative for dizziness and light-headedness.     Objective:   Vitals:   10/09/23 0836  BP: 122/60  Pulse: (!) 55  Temp: 97.9 F (36.6 C)  TempSrc: Temporal  SpO2: 100%  Weight: 199 lb 12.8 oz (90.6 kg)  Height: 6' (1.829 m)     Physical Exam Vitals reviewed.  Constitutional:      Appearance: He is well-developed.  HENT:     Head: Normocephalic and atraumatic.  Neck:     Vascular: No carotid bruit or JVD.  Cardiovascular:     Rate and Rhythm: Normal rate and regular rhythm.     Heart sounds: Normal heart sounds. No murmur heard. Pulmonary:     Effort: Pulmonary effort is normal.     Breath sounds: Normal breath sounds. No rales.  Musculoskeletal:     Right lower leg: No edema.     Left lower leg: No edema.  Skin:    General: Skin is warm and dry.  Neurological:     Mental Status: He is alert and oriented to person, place, and time.  Psychiatric:        Mood and Affect: Mood normal.        Assessment & Plan:  Brendan Gonzales is a 68 y.o. male . Type 2 diabetes mellitus with hypoglycemia without coma, without long-term current use of insulin (HCC) - Plan: Comprehensive metabolic panel with GFR, Hemoglobin A1c, Microalbumin / creatinine urine ratio  - Tolerating current med regimen, improving A1c in January.  Check levels again, ideally under 7.0 for A1c but given age under 7.5 would be reasonable.  80-month follow-up.  Allergic rhinitis,  unspecified seasonality, unspecified trigger  - Fair control with over-the-counter treatments, antihistamines discussed as well as steroid nasal sprays, RTC precautions.   Recommended COVID booster, shingles vaccine his pharmacy. No orders of the defined types were placed in this encounter.  Patient Instructions  Thanks for coming in today.  No medication changes at this time.  I will check your labs and let you know if any changes needed.    For allergies okay to take over-the-counter antihistamine such as Claritin, Allegra or Zyrtec.  If those are ineffective, you can use Flonase or other similar allergy nasal spray.  Let me know if any continued symptoms or worsening symptoms.  I do recommend the shingles vaccine through your pharmacy, as well as a COVID booster if you have not had one of those recently.  Follow-up in 3 months and we will check other labs at that time.  Take care!       Signed,   Caro Christmas, MD [facility], [facility] 10/09/23 9:22 AM

## 2023-10-14 ENCOUNTER — Encounter: Payer: Self-pay | Admitting: Family Medicine

## 2023-12-14 ENCOUNTER — Emergency Department (HOSPITAL_COMMUNITY)

## 2023-12-14 ENCOUNTER — Observation Stay (HOSPITAL_COMMUNITY)
Admission: EM | Admit: 2023-12-14 | Discharge: 2023-12-15 | Disposition: A | Discharge status: Home / Self Care | Attending: Surgery | Admitting: Surgery

## 2023-12-14 ENCOUNTER — Encounter (HOSPITAL_COMMUNITY): Payer: Self-pay

## 2023-12-14 DIAGNOSIS — R0989 Other specified symptoms and signs involving the circulatory and respiratory systems: Secondary | ICD-10-CM | POA: Diagnosis not present

## 2023-12-14 DIAGNOSIS — R000 Tachycardia, unspecified: Secondary | ICD-10-CM | POA: Diagnosis not present

## 2023-12-14 DIAGNOSIS — Z87891 Personal history of nicotine dependence: Secondary | ICD-10-CM | POA: Diagnosis not present

## 2023-12-14 DIAGNOSIS — R2689 Other abnormalities of gait and mobility: Secondary | ICD-10-CM | POA: Insufficient documentation

## 2023-12-14 DIAGNOSIS — M25562 Pain in left knee: Secondary | ICD-10-CM | POA: Diagnosis not present

## 2023-12-14 DIAGNOSIS — I10 Essential (primary) hypertension: Secondary | ICD-10-CM | POA: Insufficient documentation

## 2023-12-14 DIAGNOSIS — S0990XA Unspecified injury of head, initial encounter: Secondary | ICD-10-CM | POA: Diagnosis not present

## 2023-12-14 DIAGNOSIS — R0789 Other chest pain: Secondary | ICD-10-CM | POA: Diagnosis not present

## 2023-12-14 DIAGNOSIS — S299XXA Unspecified injury of thorax, initial encounter: Secondary | ICD-10-CM | POA: Diagnosis not present

## 2023-12-14 DIAGNOSIS — S2220XA Unspecified fracture of sternum, initial encounter for closed fracture: Principal | ICD-10-CM | POA: Insufficient documentation

## 2023-12-14 DIAGNOSIS — R079 Chest pain, unspecified: Secondary | ICD-10-CM | POA: Diagnosis not present

## 2023-12-14 DIAGNOSIS — Z79899 Other long term (current) drug therapy: Secondary | ICD-10-CM | POA: Diagnosis not present

## 2023-12-14 DIAGNOSIS — S2222XA Fracture of body of sternum, initial encounter for closed fracture: Secondary | ICD-10-CM | POA: Diagnosis not present

## 2023-12-14 DIAGNOSIS — E119 Type 2 diabetes mellitus without complications: Secondary | ICD-10-CM | POA: Insufficient documentation

## 2023-12-14 DIAGNOSIS — S3991XA Unspecified injury of abdomen, initial encounter: Secondary | ICD-10-CM | POA: Diagnosis not present

## 2023-12-14 DIAGNOSIS — Z743 Need for continuous supervision: Secondary | ICD-10-CM | POA: Diagnosis not present

## 2023-12-14 DIAGNOSIS — S199XXA Unspecified injury of neck, initial encounter: Secondary | ICD-10-CM | POA: Diagnosis not present

## 2023-12-14 DIAGNOSIS — Z041 Encounter for examination and observation following transport accident: Secondary | ICD-10-CM | POA: Diagnosis not present

## 2023-12-14 DIAGNOSIS — Z7984 Long term (current) use of oral hypoglycemic drugs: Secondary | ICD-10-CM | POA: Diagnosis not present

## 2023-12-14 DIAGNOSIS — E11649 Type 2 diabetes mellitus with hypoglycemia without coma: Secondary | ICD-10-CM

## 2023-12-14 DIAGNOSIS — Z7901 Long term (current) use of anticoagulants: Secondary | ICD-10-CM | POA: Diagnosis not present

## 2023-12-14 DIAGNOSIS — M542 Cervicalgia: Secondary | ICD-10-CM | POA: Diagnosis not present

## 2023-12-14 DIAGNOSIS — Z96652 Presence of left artificial knee joint: Secondary | ICD-10-CM | POA: Diagnosis not present

## 2023-12-14 LAB — COMPREHENSIVE METABOLIC PANEL WITH GFR
ALT: 16 U/L (ref 0–44)
AST: 23 U/L (ref 15–41)
Albumin: 3.6 g/dL (ref 3.5–5.0)
Alkaline Phosphatase: 81 U/L (ref 38–126)
Anion gap: 10 (ref 5–15)
BUN: 11 mg/dL (ref 8–23)
CO2: 23 mmol/L (ref 22–32)
Calcium: 9.1 mg/dL (ref 8.9–10.3)
Chloride: 100 mmol/L (ref 98–111)
Creatinine, Ser: 0.79 mg/dL (ref 0.61–1.24)
GFR, Estimated: >60 mL/min (ref >60)
Glucose, Bld: 113 mg/dL — ABNORMAL HIGH (ref 70–99)
Potassium: 3.5 mmol/L (ref 3.5–5.1)
Sodium: 133 mmol/L — ABNORMAL LOW (ref 135–145)
Total Bilirubin: 0.4 mg/dL (ref 0.0–1.2)
Total Protein: 7 g/dL (ref 6.5–8.1)

## 2023-12-14 LAB — I-STAT CHEM 8, ED
BUN: 12 mg/dL (ref 8–23)
Calcium, Ion: 1.1 mmol/L — ABNORMAL LOW (ref 1.15–1.40)
Chloride: 98 mmol/L (ref 98–111)
Creatinine, Ser: 0.8 mg/dL (ref 0.61–1.24)
Glucose, Bld: 111 mg/dL — ABNORMAL HIGH (ref 70–99)
HCT: 32 % — ABNORMAL LOW (ref 39.0–52.0)
Hemoglobin: 10.9 g/dL — ABNORMAL LOW (ref 13.0–17.0)
Potassium: 3.4 mmol/L — ABNORMAL LOW (ref 3.5–5.1)
Sodium: 135 mmol/L (ref 135–145)
TCO2: 21 mmol/L — ABNORMAL LOW (ref 22–32)

## 2023-12-14 LAB — SAMPLE TO BLOOD BANK

## 2023-12-14 LAB — CBC
HCT: 30.9 % — ABNORMAL LOW (ref 39.0–52.0)
Hemoglobin: 10.1 g/dL — ABNORMAL LOW (ref 13.0–17.0)
MCH: 24.7 pg — ABNORMAL LOW (ref 26.0–34.0)
MCHC: 32.7 g/dL (ref 30.0–36.0)
MCV: 75.6 fL — ABNORMAL LOW (ref 80.0–100.0)
Platelets: 289 10*3/uL (ref 150–400)
RBC: 4.09 MIL/uL — ABNORMAL LOW (ref 4.22–5.81)
RDW: 16.3 % — ABNORMAL HIGH (ref 11.5–15.5)
WBC: 5 10*3/uL (ref 4.0–10.5)
nRBC: 0 % (ref 0.0–0.2)

## 2023-12-14 LAB — PROTIME-INR
INR: 1 (ref 0.8–1.2)
Prothrombin Time: 13.4 s (ref 11.4–15.2)

## 2023-12-14 LAB — ETHANOL: Alcohol, Ethyl (B): <15 mg/dL (ref <15)

## 2023-12-14 LAB — I-STAT CG4 LACTIC ACID, ED: Lactic Acid, Venous: 1.5 mmol/L (ref 0.5–1.9)

## 2023-12-14 MED ORDER — OXYCODONE HCL 5 MG PO TABS: 5.0000 mg | ORAL_TABLET | ORAL | Status: DC | PRN

## 2023-12-14 MED ORDER — METHOCARBAMOL 1000 MG/10ML IJ SOLN
500.0000 mg | Freq: Three times a day (TID) | INTRAMUSCULAR | Status: DC
  Administered 2023-12-14 – 2023-12-15 (×2): 500 mg via INTRAVENOUS
  Filled 2023-12-14 (×3): qty 10

## 2023-12-14 MED ORDER — LISINOPRIL 20 MG PO TABS
20.0000 mg | ORAL_TABLET | Freq: Every day | ORAL | Status: DC
  Administered 2023-12-15: 20 mg via ORAL
  Filled 2023-12-14: qty 1

## 2023-12-14 MED ORDER — LISINOPRIL-HYDROCHLOROTHIAZIDE 20-12.5 MG PO TABS: 2.0000 | ORAL_TABLET | Freq: Every day | ORAL | Status: DC

## 2023-12-14 MED ORDER — POLYETHYLENE GLYCOL 3350 17 G PO PACK
17.0000 g | PACK | Freq: Two times a day (BID) | ORAL | Status: DC
  Administered 2023-12-15: 17 g via ORAL
  Filled 2023-12-14: qty 1

## 2023-12-14 MED ORDER — ONDANSETRON HCL 4 MG/2ML IJ SOLN: 4.0000 mg | Freq: Four times a day (QID) | INTRAMUSCULAR | Status: DC | PRN

## 2023-12-14 MED ORDER — DOCUSATE SODIUM 100 MG PO CAPS
100.0000 mg | ORAL_CAPSULE | Freq: Two times a day (BID) | ORAL | Status: DC
Start: 2023-12-14 — End: 2023-12-15
  Administered 2023-12-15: 100 mg via ORAL
  Filled 2023-12-14 (×2): qty 1

## 2023-12-14 MED ORDER — GLIMEPIRIDE 1 MG PO TABS
1.0000 mg | ORAL_TABLET | Freq: Every day | ORAL | Status: DC
  Administered 2023-12-15: 1 mg via ORAL
  Filled 2023-12-14 (×2): qty 1

## 2023-12-14 MED ORDER — AMLODIPINE BESYLATE 5 MG PO TABS
5.0000 mg | ORAL_TABLET | Freq: Every day | ORAL | Status: DC
  Administered 2023-12-15: 5 mg via ORAL
  Filled 2023-12-14: qty 1

## 2023-12-14 MED ORDER — ENOXAPARIN SODIUM 30 MG/0.3ML IJ SOSY
30.0000 mg | PREFILLED_SYRINGE | Freq: Two times a day (BID) | INTRAMUSCULAR | Status: DC
  Administered 2023-12-15: 30 mg via SUBCUTANEOUS
  Filled 2023-12-14: qty 0.3

## 2023-12-14 MED ORDER — ONDANSETRON 4 MG PO TBDP: 4.0000 mg | ORAL_TABLET | Freq: Four times a day (QID) | ORAL | Status: DC | PRN

## 2023-12-14 MED ORDER — METFORMIN HCL 500 MG PO TABS: 1000.0000 mg | ORAL_TABLET | Freq: Two times a day (BID) | ORAL | Status: DC

## 2023-12-14 MED ORDER — HYDROMORPHONE HCL 1 MG/ML IJ SOLN
0.5000 mg | Freq: Once | INTRAMUSCULAR | Status: AC
  Administered 2023-12-14: 0.5 mg via INTRAVENOUS
  Filled 2023-12-14: qty 1

## 2023-12-14 MED ORDER — POLYETHYLENE GLYCOL 3350 17 G PO PACK
17.0000 g | PACK | Freq: Every day | ORAL | Status: DC | PRN
Start: 2023-12-14 — End: 2023-12-15

## 2023-12-14 MED ORDER — METHOCARBAMOL 500 MG PO TABS
500.0000 mg | ORAL_TABLET | Freq: Three times a day (TID) | ORAL | Status: DC
  Administered 2023-12-15: 500 mg via ORAL
  Filled 2023-12-14 (×2): qty 1

## 2023-12-14 MED ORDER — PANTOPRAZOLE SODIUM 40 MG PO TBEC
40.0000 mg | DELAYED_RELEASE_TABLET | Freq: Every day | ORAL | Status: DC
  Administered 2023-12-15: 40 mg via ORAL
  Filled 2023-12-14: qty 1

## 2023-12-14 MED ORDER — MORPHINE SULFATE (PF) 2 MG/ML IV SOLN: 2.0000 mg | INTRAVENOUS | Status: DC | PRN

## 2023-12-14 MED ORDER — IOHEXOL 350 MG/ML SOLN
75.0000 mL | Freq: Once | INTRAVENOUS | Status: AC | PRN
  Administered 2023-12-14: 75 mL via INTRAVENOUS

## 2023-12-14 MED ORDER — HYDROCHLOROTHIAZIDE 12.5 MG PO TABS
12.5000 mg | ORAL_TABLET | Freq: Every day | ORAL | Status: DC
  Administered 2023-12-15: 12.5 mg via ORAL
  Filled 2023-12-14: qty 1
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
## 2023-12-15 ENCOUNTER — Other Ambulatory Visit (HOSPITAL_COMMUNITY): Payer: Self-pay

## 2023-12-15 DIAGNOSIS — E119 Type 2 diabetes mellitus without complications: Secondary | ICD-10-CM | POA: Diagnosis not present

## 2023-12-15 DIAGNOSIS — S2222XA Fracture of body of sternum, initial encounter for closed fracture: Secondary | ICD-10-CM | POA: Diagnosis not present

## 2023-12-15 LAB — CBC
HCT: 32.2 % — ABNORMAL LOW (ref 39.0–52.0)
Hemoglobin: 10 g/dL — ABNORMAL LOW (ref 13.0–17.0)
MCH: 24.2 pg — ABNORMAL LOW (ref 26.0–34.0)
MCHC: 31.1 g/dL (ref 30.0–36.0)
MCV: 78 fL — ABNORMAL LOW (ref 80.0–100.0)
Platelets: 278 10*3/uL (ref 150–400)
RBC: 4.13 MIL/uL — ABNORMAL LOW (ref 4.22–5.81)
RDW: 16.6 % — ABNORMAL HIGH (ref 11.5–15.5)
WBC: 7.1 10*3/uL (ref 4.0–10.5)
nRBC: 0 % (ref 0.0–0.2)

## 2023-12-15 LAB — GLUCOSE, CAPILLARY: Glucose-Capillary: 274 mg/dL — ABNORMAL HIGH (ref 70–99)

## 2023-12-15 LAB — HIV ANTIBODY (ROUTINE TESTING W REFLEX): HIV Screen 4th Generation wRfx: NONREACTIVE

## 2023-12-15 MED ORDER — OXYCODONE HCL 5 MG PO TABS
5.0000 mg | ORAL_TABLET | ORAL | 0 refills | Status: DC | PRN
  Filled 2023-12-15: qty 30, 5d supply, fill #0

## 2023-12-15 MED ORDER — LIDOCAINE 5 % EX PTCH
1.0000 | MEDICATED_PATCH | CUTANEOUS | 0 refills | Status: DC
  Filled 2023-12-15: qty 30, 30d supply, fill #0

## 2023-12-15 MED ORDER — LIDOCAINE 5 % EX PTCH
1.0000 | MEDICATED_PATCH | CUTANEOUS | Status: DC
  Administered 2023-12-15: 1 via TRANSDERMAL
  Filled 2023-12-15: qty 1

## 2023-12-15 MED ORDER — ASPIRIN 81 MG PO TBEC
81.0000 mg | DELAYED_RELEASE_TABLET | Freq: Every day | ORAL | Status: DC
  Administered 2023-12-15: 81 mg via ORAL
  Filled 2023-12-15: qty 1

## 2023-12-15 MED ORDER — TIZANIDINE HCL 2 MG PO TABS
2.0000 mg | ORAL_TABLET | Freq: Three times a day (TID) | ORAL | 0 refills | Status: AC | PRN
  Filled 2023-12-15: qty 60, 20d supply, fill #0

## 2023-12-15 MED ORDER — METFORMIN HCL 500 MG PO TABS: 1000.0000 mg | ORAL_TABLET | Freq: Two times a day (BID) | ORAL | Status: DC

## 2023-12-15 MED ORDER — ACETAMINOPHEN 500 MG PO TABS
1000.0000 mg | ORAL_TABLET | Freq: Four times a day (QID) | ORAL | Status: DC
  Administered 2023-12-15: 1000 mg via ORAL
  Filled 2023-12-15: qty 2

## 2023-12-15 MED ORDER — OXYCODONE HCL 5 MG PO TABS
5.0000 mg | ORAL_TABLET | ORAL | Status: DC | PRN
  Administered 2023-12-15: 10 mg via ORAL
  Filled 2023-12-15: qty 2

## 2023-12-15 MED ORDER — ACETAMINOPHEN 500 MG PO TABS: 1000.0000 mg | ORAL_TABLET | Freq: Four times a day (QID) | ORAL | Status: AC | PRN

## 2023-12-15 NOTE — Progress Notes (Signed)
 Physical Therapy Quick Note  PT has completed initial evaluation.    Overall, patient at supervision assistance level.   PT Follow up recommended: No follow up PT Equipment recommended:  None recommended Complete evaluation note to follow.     Leontine Hilt, [place] [facility] [phone]

## 2023-12-15 NOTE — Care Management CC44 (Signed)
 Condition Code 44 Documentation Completed  Patient Details  Name: Brendan Gonzales MRN: [id number] Date of Birth: 14-Oct-1955   Condition Code 44 given:  Yes Patient signature on Condition Code 44 notice:  Yes Documentation of 2 MD's agreement:  Yes Code 44 added to claim:  Yes    Rosalva Jon Bloch, RN 12/15/2023, 2:49 PM

## 2023-12-15 NOTE — TOC CAGE-AID Note (Signed)
 Transition of Care [facility]) - CAGE-AID Screening   Patient Details  Name: Riven Mabile MRN: [id number] Date of Birth: November 28, 1955  Transition of Care [facility]) CM/SW Contact:    Brenson Hartman E Tuleen Mandelbaum, LCSW Phone Number: 12/15/2023, 3:45 PM   Clinical Narrative:    CAGE-AID Screening:    Have You Ever Felt You Ought to Cut Down on Your Drinking or Drug Use?: No Have People Annoyed You By [employer] Your Drinking Or Drug Use?: No Have You Felt Bad Or Guilty About Your Drinking Or Drug Use?: No Have You Ever Had a Drink or Used Drugs First Thing In The Morning to Steady Your Nerves or to Get Rid of a Hangover?: No CAGE-AID Score: 0  Substance Abuse Education Offered: No

## 2023-12-15 NOTE — Care Management Obs Status (Signed)
 MEDICARE OBSERVATION STATUS NOTIFICATION   Patient Details  Name: Arnaldo Heffron MRN: [id number] Date of Birth: [birth year]   Medicare Observation Status Notification Given:  Yes    Rosalva Jon Bloch, RN 12/15/2023, 2:49 PM
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
## 2023-12-15 NOTE — Inpatient Diabetes Management (Signed)
 Inpatient Diabetes Program Recommendations  AACE/ADA: New Consensus Statement on Inpatient Glycemic Control   Target Ranges:  Prepandial:   less than 140 mg/dL      Peak postprandial:   less than 180 mg/dL (1-2 hours)      Critically ill patients:  140 - 180 mg/dL    Latest Reference Range & Units 12/15/23 09:33  Glucose-Capillary 70 - 99 mg/dL 725 (H)    Latest Reference Range & Units 12/14/23 16:58 12/14/23 17:04  Glucose 70 - 99 mg/dL 886 (H) 888 (H)   Review of Glycemic Control  Diabetes history: DM2 Outpatient Diabetes medications: Amaryl  1 mg daily, Metformin  1000 mg BID Current orders for Inpatient glycemic control: Metformin  1000 mg BID, Amaryl  1 mg QAM  Inpatient Diabetes Program Recommendations:    Insulin : While inpatient, please consider ordering CBGs AC&HS and Novolog  0-9 units TID with meals and Novolog  0-5 units at bedtime.  Thanks, Earnie Gainer, RN, MSN, CDCES Diabetes Coordinator Inpatient Diabetes Program [phone] (Team Pager from 8am to 5pm)

## 2023-12-23 ENCOUNTER — Other Ambulatory Visit: Payer: Self-pay | Admitting: Family Medicine

## 2023-12-23 DIAGNOSIS — E785 Hyperlipidemia, unspecified: Secondary | ICD-10-CM

## 2023-12-24 ENCOUNTER — Ambulatory Visit: Admitting: Family Medicine

## 2023-12-24 ENCOUNTER — Encounter: Payer: Self-pay | Admitting: Family Medicine

## 2023-12-24 ENCOUNTER — Other Ambulatory Visit (HOSPITAL_COMMUNITY): Payer: Self-pay

## 2023-12-24 ENCOUNTER — Telehealth: Payer: Self-pay

## 2023-12-24 DIAGNOSIS — R0789 Other chest pain: Secondary | ICD-10-CM | POA: Diagnosis not present

## 2023-12-24 DIAGNOSIS — S2220XD Unspecified fracture of sternum, subsequent encounter for fracture with routine healing: Secondary | ICD-10-CM | POA: Diagnosis not present

## 2023-12-24 MED ORDER — LIDOCAINE 5 % EX PTCH: 1.0000 | MEDICATED_PATCH | CUTANEOUS | 0 refills | Status: AC

## 2023-12-24 MED ORDER — OXYCODONE HCL 5 MG PO TABS: 5.0000 mg | ORAL_TABLET | ORAL | 0 refills | Status: DC | PRN
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
## 2023-12-24 NOTE — Patient Instructions (Addendum)
 I would consider taking colace to lessen risk of constipation on narcotic pain meds.  Lidocaine  patches sent to your pharmacy.  Ok to continue on oxycodone  as needed, but try tapering this first as pain improves.  Ok to continue the tylenol  (do not exceed the max daily dosing), and muscle relaxant as needed.  Try to take a deep breath with every commercial on tv.   Recheck in 2 weeks, but let me know if any changes.   Return to the clinic or go to the nearest emergency room if any of your symptoms worsen or new symptoms occur.   Hang in there!

## 2023-12-24 NOTE — Telephone Encounter (Signed)
 Pharmacy Patient Advocate Encounter   Received notification from CoverMyMeds that prior authorization for Lidocaine  5% patches is required/requested.   Insurance verification completed.   The patient is insured through [facility] .   Per test claim: PA required; PA submitted to above mentioned insurance via CoverMyMeds Key/confirmation #/EOC [id number] Status is pending

## 2023-12-25 NOTE — Telephone Encounter (Signed)
 Pt notes okay with this will consider purchasing out of pocket but will wait and see how he is feeling

## 2023-12-25 NOTE — Telephone Encounter (Signed)
 Is there an alternative you'd like to offer?

## 2023-12-25 NOTE — Telephone Encounter (Signed)
 Unfortunately based on the requirements from insurance it does not look like this will be covered.  I am not sure what those would cost out-of-pocket but that would be an option to see if he wants to buy them without insurance.  Could use GoodRx or other discount pharmacy coupon.  Other pain regimen discussed at his visit yesterday.

## 2023-12-25 NOTE — Telephone Encounter (Signed)
 Pharmacy Patient Advocate Encounter  Received notification from [facility] that Prior Authorization for Lidocaine  5% patches has been DENIED.  Full denial letter will be uploaded to the media tab. See denial reason below.   PA #/Case ID/Reference #: [id number]

## 2023-12-27 ENCOUNTER — Other Ambulatory Visit: Payer: Self-pay | Admitting: Family Medicine

## 2023-12-27 DIAGNOSIS — S2220XD Unspecified fracture of sternum, subsequent encounter for fracture with routine healing: Secondary | ICD-10-CM

## 2023-12-27 DIAGNOSIS — R0789 Other chest pain: Secondary | ICD-10-CM

## 2023-12-29 ENCOUNTER — Other Ambulatory Visit: Payer: Self-pay | Admitting: Family Medicine

## 2023-12-29 DIAGNOSIS — R0789 Other chest pain: Secondary | ICD-10-CM

## 2023-12-29 DIAGNOSIS — S2220XD Unspecified fracture of sternum, subsequent encounter for fracture with routine healing: Secondary | ICD-10-CM

## 2023-12-29 NOTE — Telephone Encounter (Signed)
 Sternal pain from fracture discussed at his July 2 visit.  Controlled substance database reviewed.  Oxycodone  5 mg #30 last filled on 12/15/2023, refill ordered.

## 2023-12-29 NOTE — Telephone Encounter (Signed)
 New prescription sent with clarification of instructions for 1 pill every 4 hours as needed, which would be max 6 pills/day.

## 2023-12-31 DIAGNOSIS — Z8249 Family history of ischemic heart disease and other diseases of the circulatory system: Secondary | ICD-10-CM | POA: Diagnosis not present

## 2023-12-31 DIAGNOSIS — Z87891 Personal history of nicotine dependence: Secondary | ICD-10-CM | POA: Diagnosis not present

## 2023-12-31 DIAGNOSIS — Z7984 Long term (current) use of oral hypoglycemic drugs: Secondary | ICD-10-CM | POA: Diagnosis not present

## 2023-12-31 DIAGNOSIS — Z833 Family history of diabetes mellitus: Secondary | ICD-10-CM | POA: Diagnosis not present

## 2023-12-31 DIAGNOSIS — Z008 Encounter for other general examination: Secondary | ICD-10-CM | POA: Diagnosis not present

## 2023-12-31 DIAGNOSIS — K5900 Constipation, unspecified: Secondary | ICD-10-CM | POA: Diagnosis not present

## 2023-12-31 DIAGNOSIS — E785 Hyperlipidemia, unspecified: Secondary | ICD-10-CM | POA: Diagnosis not present

## 2023-12-31 DIAGNOSIS — M62838 Other muscle spasm: Secondary | ICD-10-CM | POA: Diagnosis not present

## 2023-12-31 DIAGNOSIS — E119 Type 2 diabetes mellitus without complications: Secondary | ICD-10-CM | POA: Diagnosis not present

## 2023-12-31 DIAGNOSIS — I10 Essential (primary) hypertension: Secondary | ICD-10-CM | POA: Diagnosis not present

## 2023-12-31 DIAGNOSIS — Z7982 Long term (current) use of aspirin: Secondary | ICD-10-CM | POA: Diagnosis not present

## 2023-12-31 DIAGNOSIS — J309 Allergic rhinitis, unspecified: Secondary | ICD-10-CM | POA: Diagnosis not present

## 2023-12-31 DIAGNOSIS — M1990 Unspecified osteoarthritis, unspecified site: Secondary | ICD-10-CM | POA: Diagnosis not present

## 2024-01-01 ENCOUNTER — Telehealth: Payer: Self-pay | Admitting: Family Medicine

## 2024-01-01 ENCOUNTER — Telehealth: Payer: Self-pay

## 2024-01-01 NOTE — Telephone Encounter (Signed)
 Type of form received: Short Term Disability Forms  Additional comments:   Received by: Fax  Form should be Faxed/mailed to: (address/ fax #) [phone]  Is patient requesting call for pickup: N/A  Form placed:  Labeled & placed in provider bin  Attach charge sheet.  Provider will determine charge.  Individual made aware of 3-5 business day turn around? N/A

## 2024-01-01 NOTE — Telephone Encounter (Signed)
 Placed in your sign folder

## 2024-01-01 NOTE — Telephone Encounter (Signed)
 Copied from CRM [id number]. Topic: General - Other >> Jan 01, 2024 11:55 AM Rosina BIRCH wrote: Reason for CRM: nicole from [employer] want to verify the patient last visit and next office visit. Nat is going to fax over [facility] paperwork and authorization letter to the office CB [phone]

## 2024-01-08 ENCOUNTER — Encounter: Payer: Self-pay | Admitting: Family Medicine

## 2024-01-08 ENCOUNTER — Ambulatory Visit: Admitting: Family Medicine

## 2024-01-08 VITALS — BP 124/60 | HR 64 | Temp 97.9°F | Ht 72.0 in | Wt 198.1 lb

## 2024-01-08 DIAGNOSIS — E11649 Type 2 diabetes mellitus with hypoglycemia without coma: Secondary | ICD-10-CM | POA: Diagnosis not present

## 2024-01-08 DIAGNOSIS — E785 Hyperlipidemia, unspecified: Secondary | ICD-10-CM

## 2024-01-08 DIAGNOSIS — S2220XD Unspecified fracture of sternum, subsequent encounter for fracture with routine healing: Secondary | ICD-10-CM

## 2024-01-08 DIAGNOSIS — I10 Essential (primary) hypertension: Secondary | ICD-10-CM | POA: Diagnosis not present

## 2024-01-08 DIAGNOSIS — R0789 Other chest pain: Secondary | ICD-10-CM | POA: Diagnosis not present

## 2024-01-08 DIAGNOSIS — E1165 Type 2 diabetes mellitus with hyperglycemia: Secondary | ICD-10-CM

## 2024-01-08 DIAGNOSIS — Z0279 Encounter for issue of other medical certificate: Secondary | ICD-10-CM

## 2024-01-08 DIAGNOSIS — K219 Gastro-esophageal reflux disease without esophagitis: Secondary | ICD-10-CM | POA: Diagnosis not present

## 2024-01-08 DIAGNOSIS — Z7984 Long term (current) use of oral hypoglycemic drugs: Secondary | ICD-10-CM | POA: Diagnosis not present

## 2024-01-08 LAB — COMPREHENSIVE METABOLIC PANEL WITH GFR
ALT: 13 U/L (ref 0–53)
AST: 17 U/L (ref 0–37)
Albumin: 4.4 g/dL (ref 3.5–5.2)
Alkaline Phosphatase: 93 U/L (ref 39–117)
BUN: 9 mg/dL (ref 6–23)
CO2: 30 meq/L (ref 19–32)
Calcium: 9.3 mg/dL (ref 8.4–10.5)
Chloride: 96 meq/L (ref 96–112)
Creatinine, Ser: 0.63 mg/dL (ref 0.40–1.50)
GFR: 98.92 mL/min (ref >60.00)
Glucose, Bld: 115 mg/dL — ABNORMAL HIGH (ref 70–99)
Potassium: 4 meq/L (ref 3.5–5.1)
Sodium: 134 meq/L — ABNORMAL LOW (ref 135–145)
Total Bilirubin: 0.3 mg/dL (ref 0.2–1.2)
Total Protein: 7.5 g/dL (ref 6.0–8.3)

## 2024-01-08 LAB — MICROALBUMIN / CREATININE URINE RATIO
Creatinine,U: 65.3 mg/dL
Microalb Creat Ratio: 14.9 mg/g (ref 0.0–30.0)
Microalb, Ur: 1 mg/dL (ref 0.0–1.9)

## 2024-01-08 LAB — HEMOGLOBIN A1C: Hgb A1c MFr Bld: 7.5 % — ABNORMAL HIGH (ref 4.6–6.5)

## 2024-01-08 LAB — LIPID PANEL
Cholesterol: 142 mg/dL (ref 0–200)
HDL: 50.4 mg/dL (ref >39.00)
LDL Cholesterol: 66 mg/dL (ref 0–99)
NonHDL: 91.79
Total CHOL/HDL Ratio: 3
Triglycerides: 129 mg/dL (ref 0.0–149.0)
VLDL: 25.8 mg/dL (ref 0.0–40.0)

## 2024-01-08 MED ORDER — LISINOPRIL-HYDROCHLOROTHIAZIDE 20-12.5 MG PO TABS: 2.0000 | ORAL_TABLET | Freq: Every day | ORAL | 1 refills | Status: DC

## 2024-01-08 MED ORDER — ATORVASTATIN CALCIUM 10 MG PO TABS: 10.0000 mg | ORAL_TABLET | Freq: Every day | ORAL | 1 refills | Status: DC

## 2024-01-08 MED ORDER — PANTOPRAZOLE SODIUM 40 MG PO TBEC
40.0000 mg | DELAYED_RELEASE_TABLET | Freq: Every day | ORAL | 1 refills | Status: AC
Start: 2024-01-08 — End: ?

## 2024-01-08 MED ORDER — METFORMIN HCL 500 MG PO TABS: 1000.0000 mg | ORAL_TABLET | Freq: Two times a day (BID) | ORAL | 1 refills | Status: DC

## 2024-01-08 MED ORDER — GLIMEPIRIDE 1 MG PO TABS: 1.0000 mg | ORAL_TABLET | Freq: Every day | ORAL | 1 refills | Status: DC

## 2024-01-08 MED ORDER — AMLODIPINE BESYLATE 5 MG PO TABS: 5.0000 mg | ORAL_TABLET | Freq: Every day | ORAL | 1 refills | Status: DC

## 2024-01-08 NOTE — Telephone Encounter (Signed)
 Completed at time of office visit on July 17, given to Tonya.
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
## 2024-01-08 NOTE — Patient Instructions (Signed)
 Paperwork completed. Ok to return to work on Monday as planned.  If you return to work and the restrictions that are in place from surgery are not sufficient, schedule a visit with me and we can adjust his restrictions temporarily.  I will follow-up with you in 1 month as long as you are doing okay with work restrictions and then returning to full duty as recommended by general surgery but again follow-up with me sooner if that is not working.  If any concerns on labs I will let you know but no change in meds from today.

## 2024-01-09 ENCOUNTER — Encounter: Payer: Self-pay | Admitting: Family Medicine

## 2024-01-09 ENCOUNTER — Telehealth: Payer: Self-pay

## 2024-01-09 NOTE — Telephone Encounter (Signed)
 New note sent to the patient's MyChart account, indicating same recommendations from general surgery for the return to work note.  Let me know if his employer needs further information.

## 2024-01-09 NOTE — Telephone Encounter (Signed)
 Patient needing a new return to work note, Production designer, theatre/television/film would not accept last note from visit. Patient state note needs to say patient is able to return back to work on Monday 7/21. Patient is needing note today to be able to return Monday. Patient states note can be placed in mychart.

## 2024-01-09 NOTE — Telephone Encounter (Signed)
 Called patient and he is aware of note in chart for return to work. Patient will call back if his employer needs anything else.

## 2024-01-09 NOTE — Telephone Encounter (Signed)
 Copied from CRM [id number]. Topic: General - Other >> Jan 09, 2024 12:51 PM Rosina BIRCH wrote: Reason for CRM: patient called stating he need a new work note to return on Monday because he need to send it to his Production designer, theatre/television/film.  The manager will not accept the last letter  CB [phone]

## 2024-01-10 ENCOUNTER — Ambulatory Visit: Payer: Self-pay | Admitting: Family Medicine

## 2024-02-18 ENCOUNTER — Ambulatory Visit: Admitting: Family Medicine

## 2024-02-18 ENCOUNTER — Encounter: Payer: Self-pay | Admitting: Family Medicine

## 2024-02-18 VITALS — BP 126/64 | HR 58 | Temp 97.9°F | Resp 15 | Ht 72.0 in | Wt 197.4 lb

## 2024-02-18 DIAGNOSIS — Z7184 Encounter for health counseling related to travel: Secondary | ICD-10-CM

## 2024-02-18 DIAGNOSIS — S2220XD Unspecified fracture of sternum, subsequent encounter for fracture with routine healing: Secondary | ICD-10-CM | POA: Diagnosis not present

## 2024-02-18 DIAGNOSIS — Z0184 Encounter for antibody response examination: Secondary | ICD-10-CM | POA: Diagnosis not present

## 2024-02-18 DIAGNOSIS — R9389 Abnormal findings on diagnostic imaging of other specified body structures: Secondary | ICD-10-CM | POA: Diagnosis not present

## 2024-02-18 DIAGNOSIS — Z2989 Encounter for other specified prophylactic measures: Secondary | ICD-10-CM | POA: Diagnosis not present

## 2024-02-18 DIAGNOSIS — Z23 Encounter for immunization: Secondary | ICD-10-CM

## 2024-02-18 MED ORDER — DOXYCYCLINE HYCLATE 100 MG PO TABS: 100.0000 mg | ORAL_TABLET | Freq: Every day | ORAL | 0 refills | Status: DC

## 2024-02-18 MED ORDER — VIVOTIF PO CPDR: 1.0000 | DELAYED_RELEASE_CAPSULE | ORAL | 0 refills | Status: DC
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
## 2024-02-18 NOTE — Patient Instructions (Addendum)
 You should have a refill left on your meds- see if the pharmacy will fill those a few days or week before your trip. Have them call me if needed.   I will order CT scan of your chest to make sure the fracture is healing and prior bruising has improved.  Tylenol  is okay if needed.  If you continue to require stronger pain medication or any worsening symptoms let me know.  Again the CT scan should show that area healing and I will let you know once I review the results.  For travel: I will check test for measles immunity, as well as Hepatitis A and B.  Shingles vaccine at your pharmacy as well as covid booster and flu vaccine prior to travel.  Try to avoid mosquitoes, use repellant with Deet. Doxycycline  once per day to help prevent malaria- start few days prior to travel, continue 1 month when you return.  Vivotif  typhoid vaccine ordered. If you pharmacy is unable to get that medication, I recommend the health department to see if they have the injection. Make sure to complete that medicine prior to starting doxycycline .  Have a safe trip and take care. Here is the [facility] website for travel info: DebateUs.[email or web address]

## 2024-02-19 LAB — MEASLES/MUMPS/RUBELLA IMMUNITY
Mumps IgG: >300 [AU]/ml
Rubella: 15.4 {index}
Rubeola IgG: >300 [AU]/ml

## 2024-02-19 LAB — HEPATITIS A ANTIBODY, TOTAL: Hepatitis A AB,Total: REACTIVE — AB

## 2024-02-19 LAB — HEPATITIS B SURFACE ANTIBODY, QUANTITATIVE: Hep B S AB Quant (Post): 78 m[IU]/mL (ref >=10)

## 2024-02-20 ENCOUNTER — Telehealth: Payer: Self-pay

## 2024-02-20 ENCOUNTER — Ambulatory Visit: Payer: Self-pay | Admitting: Family Medicine

## 2024-02-20 NOTE — Telephone Encounter (Unsigned)
 Copied from CRM [id number]. Topic: Medical Record Request - Other >> Feb 20, 2024  8:47 AM Pinkey ORN wrote: Reason for CRM: [facility] >> Feb 20, 2024  8:49 AM Pinkey ORN wrote: Brendan Gonzales [facility] [phone] (direct line)  Called on behalf of patient, wanting to confirm rather or not the prior authorization has been completed for patient. States this needs to be completed by 12:00 today or else they'll have to cancel patient.

## 2024-02-20 NOTE — Telephone Encounter (Signed)
 Do you have an update on PA?

## 2024-02-25 ENCOUNTER — Ambulatory Visit
Admission: RE | Admit: 2024-02-25 | Discharge: 2024-02-25 | Disposition: A | Discharge status: Home / Self Care | Source: Ambulatory Visit | Attending: Family Medicine | Admitting: Family Medicine

## 2024-02-25 DIAGNOSIS — R918 Other nonspecific abnormal finding of lung field: Secondary | ICD-10-CM | POA: Diagnosis not present

## 2024-02-25 DIAGNOSIS — S2220XD Unspecified fracture of sternum, subsequent encounter for fracture with routine healing: Secondary | ICD-10-CM

## 2024-02-25 NOTE — Telephone Encounter (Signed)
 Auth was completed 02/20/24, and appointment notes and referral notes were updated to reflect authorization information. Patient completed imaging appointment/exam on 02/20/24.

## 2024-03-05 ENCOUNTER — Other Ambulatory Visit: Payer: Self-pay | Admitting: Family Medicine

## 2024-03-05 DIAGNOSIS — S2220XK Unspecified fracture of sternum, subsequent encounter for fracture with nonunion: Secondary | ICD-10-CM

## 2024-03-05 NOTE — Progress Notes (Signed)
See CT results.  

## 2024-03-08 NOTE — Telephone Encounter (Signed)
 Please see patients message about referral. Thank you!

## 2024-03-08 NOTE — Telephone Encounter (Signed)
 Reply sent to patient

## 2024-03-09 ENCOUNTER — Other Ambulatory Visit: Payer: Self-pay | Admitting: Family Medicine

## 2024-03-09 DIAGNOSIS — S2220XK Unspecified fracture of sternum, subsequent encounter for fracture with nonunion: Secondary | ICD-10-CM

## 2024-03-09 NOTE — Progress Notes (Signed)
 Ortho recommended thoracic surgery eval - referral placed.

## 2024-03-11 NOTE — Telephone Encounter (Signed)
 I replaced a referral to cardiothoracic surgery on September 16.  Thanks

## 2024-03-19 ENCOUNTER — Encounter: Payer: Self-pay | Admitting: Thoracic Surgery (Cardiothoracic Vascular Surgery)

## 2024-03-19 ENCOUNTER — Ambulatory Visit
Attending: Thoracic Surgery (Cardiothoracic Vascular Surgery) | Admitting: Thoracic Surgery (Cardiothoracic Vascular Surgery)

## 2024-03-19 VITALS — BP 152/71 | HR 60 | Resp 20 | Ht 72.0 in | Wt 199.2 lb

## 2024-03-19 DIAGNOSIS — R0789 Other chest pain: Secondary | ICD-10-CM
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
## 2024-03-24 DIAGNOSIS — M1711 Unilateral primary osteoarthritis, right knee: Secondary | ICD-10-CM | POA: Diagnosis not present

## 2024-03-24 DIAGNOSIS — Z96652 Presence of left artificial knee joint: Secondary | ICD-10-CM | POA: Diagnosis not present

## 2024-06-04 NOTE — Progress Notes (Signed)
 Elson Ulbrich                                          MRN: [id number]   06/04/2024   The [facility] Quality Team Specialist reviewed this patient medical record for the purposes of chart review for care gap closure. The following were reviewed: chart review for care gap closure-controlling blood pressure.    [facility] Quality Team

## 2024-06-16 ENCOUNTER — Other Ambulatory Visit: Payer: Self-pay | Admitting: Family Medicine

## 2024-06-23 ENCOUNTER — Other Ambulatory Visit: Payer: Self-pay | Admitting: Thoracic Surgery (Cardiothoracic Vascular Surgery)

## 2024-06-23 DIAGNOSIS — R0789 Other chest pain: Secondary | ICD-10-CM

## 2024-07-02 ENCOUNTER — Ambulatory Visit (HOSPITAL_COMMUNITY)
Admission: RE | Admit: 2024-07-02 | Discharge: 2024-07-02 | Disposition: A | Source: Ambulatory Visit | Attending: Thoracic Surgery (Cardiothoracic Vascular Surgery)

## 2024-07-02 DIAGNOSIS — R0789 Other chest pain: Secondary | ICD-10-CM | POA: Insufficient documentation

## 2024-07-13 LAB — OPHTHALMOLOGY REPORT-SCANNED

## 2024-07-13 NOTE — Progress Notes (Signed)
 Irwin Toran                                          MRN: [id number]   07/13/2024   The [facility] Quality Team Specialist reviewed this patient medical record for the purposes of chart review for care gap closure. The following were reviewed: chart review for care gap closure-controlling blood pressure.    [facility] Quality Team
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
## 2024-07-16 ENCOUNTER — Encounter: Payer: Self-pay | Admitting: Thoracic Surgery (Cardiothoracic Vascular Surgery)

## 2024-07-16 ENCOUNTER — Ambulatory Visit
Attending: Thoracic Surgery (Cardiothoracic Vascular Surgery) | Admitting: Thoracic Surgery (Cardiothoracic Vascular Surgery)

## 2024-07-16 VITALS — BP 157/77 | HR 58 | Resp 20 | Ht 72.0 in | Wt 206.2 lb

## 2024-07-16 DIAGNOSIS — S2220XK Unspecified fracture of sternum, subsequent encounter for fracture with nonunion: Secondary | ICD-10-CM

## 2024-07-16 DIAGNOSIS — S2220XA Unspecified fracture of sternum, initial encounter for closed fracture: Secondary | ICD-10-CM | POA: Insufficient documentation

## 2024-07-22 ENCOUNTER — Ambulatory Visit (INDEPENDENT_AMBULATORY_CARE_PROVIDER_SITE_OTHER): Admitting: Family Medicine

## 2024-07-22 ENCOUNTER — Ambulatory Visit

## 2024-07-22 ENCOUNTER — Encounter: Payer: Self-pay | Admitting: Family Medicine

## 2024-07-22 VITALS — BP 112/62 | HR 63 | Temp 98.0°F | Resp 18 | Ht 72.0 in | Wt 204.6 lb

## 2024-07-22 DIAGNOSIS — E785 Hyperlipidemia, unspecified: Secondary | ICD-10-CM | POA: Diagnosis not present

## 2024-07-22 DIAGNOSIS — R350 Frequency of micturition: Secondary | ICD-10-CM | POA: Diagnosis not present

## 2024-07-22 DIAGNOSIS — R300 Dysuria: Secondary | ICD-10-CM

## 2024-07-22 DIAGNOSIS — Z1211 Encounter for screening for malignant neoplasm of colon: Secondary | ICD-10-CM

## 2024-07-22 DIAGNOSIS — Z7984 Long term (current) use of oral hypoglycemic drugs: Secondary | ICD-10-CM | POA: Diagnosis not present

## 2024-07-22 DIAGNOSIS — E11649 Type 2 diabetes mellitus with hypoglycemia without coma: Secondary | ICD-10-CM | POA: Diagnosis not present

## 2024-07-22 DIAGNOSIS — S2220XK Unspecified fracture of sternum, subsequent encounter for fracture with nonunion: Secondary | ICD-10-CM | POA: Diagnosis not present

## 2024-07-22 DIAGNOSIS — E1165 Type 2 diabetes mellitus with hyperglycemia: Secondary | ICD-10-CM | POA: Diagnosis not present

## 2024-07-22 DIAGNOSIS — I10 Essential (primary) hypertension: Secondary | ICD-10-CM | POA: Diagnosis not present

## 2024-07-22 LAB — POCT URINALYSIS DIP (MANUAL ENTRY)
Bilirubin, UA: NEGATIVE
Blood, UA: NEGATIVE
Glucose, UA: NEGATIVE mg/dL
Ketones, POC UA: NEGATIVE mg/dL
Leukocytes, UA: NEGATIVE
Nitrite, UA: NEGATIVE
Protein Ur, POC: NEGATIVE mg/dL
Spec Grav, UA: 1.015
Urobilinogen, UA: 0.2 U/dL
pH, UA: 6

## 2024-07-22 LAB — COMPREHENSIVE METABOLIC PANEL WITH GFR
ALT: 14 U/L (ref 3–53)
AST: 17 U/L (ref 5–37)
Albumin: 4.3 g/dL (ref 3.5–5.2)
Alkaline Phosphatase: 92 U/L (ref 39–117)
BUN: 11 mg/dL (ref 6–23)
CO2: 33 meq/L — ABNORMAL HIGH (ref 19–32)
Calcium: 9.3 mg/dL (ref 8.4–10.5)
Chloride: 99 meq/L (ref 96–112)
Creatinine, Ser: 0.61 mg/dL (ref 0.40–1.50)
GFR: 99.51 mL/min
Glucose, Bld: 91 mg/dL (ref 70–99)
Potassium: 4.1 meq/L (ref 3.5–5.1)
Sodium: 138 meq/L (ref 135–145)
Total Bilirubin: 0.3 mg/dL (ref 0.2–1.2)
Total Protein: 7.2 g/dL (ref 6.0–8.3)

## 2024-07-22 LAB — POCT GLYCOSYLATED HEMOGLOBIN (HGB A1C): Hemoglobin A1C: 6.8 % — AB (ref 4.0–5.6)

## 2024-07-22 LAB — LIPID PANEL
Cholesterol: 138 mg/dL (ref 28–200)
HDL: 44.2 mg/dL
LDL Cholesterol: 70 mg/dL (ref 10–99)
NonHDL: 93.62
Total CHOL/HDL Ratio: 3
Triglycerides: 119 mg/dL (ref 10.0–149.0)
VLDL: 23.8 mg/dL (ref 0.0–40.0)

## 2024-07-22 LAB — PSA: PSA: 4.23 ng/mL — ABNORMAL HIGH (ref 0.10–4.00)

## 2024-07-22 MED ORDER — ATORVASTATIN CALCIUM 10 MG PO TABS: 10.0000 mg | ORAL_TABLET | Freq: Every day | ORAL | 1 refills | Status: AC

## 2024-07-22 MED ORDER — AMLODIPINE BESYLATE 5 MG PO TABS: 5.0000 mg | ORAL_TABLET | Freq: Every day | ORAL | 1 refills | Status: AC

## 2024-07-22 MED ORDER — GLIMEPIRIDE 1 MG PO TABS: 1.0000 mg | ORAL_TABLET | Freq: Every day | ORAL | 0 refills | Status: AC

## 2024-07-22 MED ORDER — LISINOPRIL-HYDROCHLOROTHIAZIDE 20-12.5 MG PO TABS: 2.0000 | ORAL_TABLET | Freq: Every day | ORAL | 1 refills | Status: AC

## 2024-07-22 MED ORDER — METFORMIN HCL 500 MG PO TABS: 1000.0000 mg | ORAL_TABLET | Freq: Two times a day (BID) | ORAL | 1 refills | Status: AC

## 2024-07-22 NOTE — Patient Instructions (Addendum)
 Thank you for coming in today. No change in medications at this time. A1c looks better.  Urine test looks okay today, I do not see any signs of infection but I will check a prostate test and urine culture to make sure.  Continue to drink plenty of fluids, try to avoid sodas.  If the urinary symptoms do not improve in the next week or any worsening symptoms please return for further testing.  If there are any concerns on your bloodwork, I will let you know. Take care!
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
## 2024-07-23 LAB — URINE CULTURE
MICRO NUMBER:: 17527103
Result:: NO GROWTH
SPECIMEN QUALITY:: ADEQUATE

## 2024-07-24 ENCOUNTER — Ambulatory Visit: Payer: Self-pay | Admitting: Family Medicine

## 2024-07-24 DIAGNOSIS — R300 Dysuria: Secondary | ICD-10-CM

## 2024-07-24 DIAGNOSIS — R972 Elevated prostate specific antigen [PSA]: Secondary | ICD-10-CM

## 2024-07-24 DIAGNOSIS — R350 Frequency of micturition: Secondary | ICD-10-CM

## 2024-07-24 MED ORDER — SULFAMETHOXAZOLE-TRIMETHOPRIM 800-160 MG PO TABS: 1.0000 | ORAL_TABLET | Freq: Two times a day (BID) | ORAL | 0 refills | Status: AC

## 2024-07-26 ENCOUNTER — Ambulatory Visit

## 2024-07-27 ENCOUNTER — Ambulatory Visit

## 2024-07-27 ENCOUNTER — Other Ambulatory Visit: Payer: Self-pay

## 2024-07-27 DIAGNOSIS — M546 Pain in thoracic spine: Secondary | ICD-10-CM

## 2024-07-27 DIAGNOSIS — R293 Abnormal posture: Secondary | ICD-10-CM

## 2024-07-27 DIAGNOSIS — S2220XS Unspecified fracture of sternum, sequela: Secondary | ICD-10-CM

## 2024-08-02 ENCOUNTER — Ambulatory Visit

## 2024-08-09 ENCOUNTER — Ambulatory Visit

## 2024-08-13 ENCOUNTER — Telehealth: Admitting: Thoracic Surgery (Cardiothoracic Vascular Surgery)

## 2024-08-16 ENCOUNTER — Ambulatory Visit

## 2024-08-23 ENCOUNTER — Ambulatory Visit

## 2024-11-24 ENCOUNTER — Encounter: Admitting: Family Medicine
